# Patient Record
Sex: Female | Born: 1970 | Race: White | Hispanic: No | Marital: Married | Smoking: Former smoker
Health system: Southern US, Community
[De-identification: ages and names within clinical notes are randomized; demographics above are authoritative.]

## PROBLEM LIST (undated history)

## (undated) DIAGNOSIS — H269 Unspecified cataract: Secondary | ICD-10-CM

## (undated) DIAGNOSIS — I10 Essential (primary) hypertension: Secondary | ICD-10-CM

## (undated) DIAGNOSIS — E7800 Pure hypercholesterolemia, unspecified: Secondary | ICD-10-CM

## (undated) DIAGNOSIS — K5900 Constipation, unspecified: Secondary | ICD-10-CM

## (undated) DIAGNOSIS — F419 Anxiety disorder, unspecified: Secondary | ICD-10-CM

## (undated) DIAGNOSIS — R7303 Prediabetes: Secondary | ICD-10-CM

## (undated) DIAGNOSIS — E669 Obesity, unspecified: Secondary | ICD-10-CM

## (undated) DIAGNOSIS — K219 Gastro-esophageal reflux disease without esophagitis: Secondary | ICD-10-CM

## (undated) DIAGNOSIS — T7840XA Allergy, unspecified, initial encounter: Secondary | ICD-10-CM

## (undated) DIAGNOSIS — R310 Gross hematuria: Secondary | ICD-10-CM

## (undated) DIAGNOSIS — D649 Anemia, unspecified: Secondary | ICD-10-CM

## (undated) DIAGNOSIS — N3020 Other chronic cystitis without hematuria: Secondary | ICD-10-CM

## (undated) DIAGNOSIS — R011 Cardiac murmur, unspecified: Secondary | ICD-10-CM

## (undated) DIAGNOSIS — G4730 Sleep apnea, unspecified: Secondary | ICD-10-CM

## (undated) DIAGNOSIS — F32A Depression, unspecified: Secondary | ICD-10-CM

## (undated) HISTORY — PX: CARPAL TUNNEL RELEASE: SHX101

## (undated) HISTORY — PX: TOTAL ABDOMINAL HYSTERECTOMY: SHX209

## (undated) HISTORY — DX: Depression, unspecified: F32.A

## (undated) HISTORY — DX: Prediabetes: R73.03

## (undated) HISTORY — DX: Anxiety disorder, unspecified: F41.9

## (undated) HISTORY — PX: ADENOIDECTOMY: SUR15

## (undated) HISTORY — DX: Unspecified cataract: H26.9

## (undated) HISTORY — DX: Pure hypercholesterolemia, unspecified: E78.00

## (undated) HISTORY — DX: Gastro-esophageal reflux disease without esophagitis: K21.9

## (undated) HISTORY — DX: Allergy, unspecified, initial encounter: T78.40XA

## (undated) HISTORY — DX: Sleep apnea, unspecified: G47.30

## (undated) HISTORY — DX: Cardiac murmur, unspecified: R01.1

## (undated) HISTORY — DX: Other chronic cystitis without hematuria: N30.20

## (undated) HISTORY — DX: Constipation, unspecified: K59.00

## (undated) HISTORY — DX: Anemia, unspecified: D64.9

## (undated) HISTORY — DX: Obesity, unspecified: E66.9

## (undated) HISTORY — PX: TONSILLECTOMY: SUR1361

## (undated) HISTORY — PX: ULNAR TUNNEL RELEASE: SHX820

## (undated) HISTORY — DX: Essential (primary) hypertension: I10

## (undated) HISTORY — DX: Gross hematuria: R31.0

## (undated) HISTORY — PX: ABDOMINAL HYSTERECTOMY: SHX81

---

## 1997-10-14 ENCOUNTER — Inpatient Hospital Stay (HOSPITAL_COMMUNITY): Admission: AD | Admit: 1997-10-14 | Discharge: 1997-10-16 | Payer: Self-pay | Admitting: Obstetrics and Gynecology

## 1998-03-18 ENCOUNTER — Ambulatory Visit (HOSPITAL_COMMUNITY): Admission: RE | Admit: 1998-03-18 | Discharge: 1998-03-18 | Payer: Self-pay | Admitting: Gastroenterology

## 1998-05-15 ENCOUNTER — Other Ambulatory Visit: Admission: RE | Admit: 1998-05-15 | Discharge: 1998-05-15 | Payer: Self-pay | Admitting: Obstetrics & Gynecology

## 1998-05-31 ENCOUNTER — Emergency Department (HOSPITAL_COMMUNITY): Admission: EM | Admit: 1998-05-31 | Discharge: 1998-05-31 | Payer: Self-pay | Admitting: Family Medicine

## 1999-02-25 ENCOUNTER — Emergency Department (HOSPITAL_COMMUNITY): Admission: EM | Admit: 1999-02-25 | Discharge: 1999-02-25 | Payer: Self-pay | Admitting: Emergency Medicine

## 1999-06-05 ENCOUNTER — Other Ambulatory Visit: Admission: RE | Admit: 1999-06-05 | Discharge: 1999-06-05 | Payer: Self-pay | Admitting: Obstetrics & Gynecology

## 2000-04-23 ENCOUNTER — Emergency Department (HOSPITAL_COMMUNITY): Admission: EM | Admit: 2000-04-23 | Discharge: 2000-04-23 | Payer: Self-pay

## 2000-07-08 ENCOUNTER — Other Ambulatory Visit: Admission: RE | Admit: 2000-07-08 | Discharge: 2000-07-08 | Payer: Self-pay | Admitting: Obstetrics & Gynecology

## 2003-07-12 ENCOUNTER — Other Ambulatory Visit: Admission: RE | Admit: 2003-07-12 | Discharge: 2003-07-12 | Payer: Self-pay | Admitting: Obstetrics & Gynecology

## 2004-08-27 ENCOUNTER — Other Ambulatory Visit: Admission: RE | Admit: 2004-08-27 | Discharge: 2004-08-27 | Payer: Self-pay | Admitting: Obstetrics & Gynecology

## 2006-05-01 ENCOUNTER — Emergency Department (HOSPITAL_COMMUNITY): Admission: EM | Admit: 2006-05-01 | Discharge: 2006-05-01 | Payer: Self-pay | Admitting: Family Medicine

## 2007-11-21 ENCOUNTER — Emergency Department (HOSPITAL_COMMUNITY): Admission: EM | Admit: 2007-11-21 | Discharge: 2007-11-22 | Payer: Self-pay | Admitting: Emergency Medicine

## 2007-11-28 ENCOUNTER — Encounter: Admission: RE | Admit: 2007-11-28 | Discharge: 2007-11-28 | Payer: Self-pay | Admitting: Family Medicine

## 2008-07-02 ENCOUNTER — Inpatient Hospital Stay (HOSPITAL_COMMUNITY): Admission: RE | Admit: 2008-07-02 | Discharge: 2008-07-03 | Payer: Self-pay | Admitting: Obstetrics & Gynecology

## 2008-07-02 ENCOUNTER — Encounter (INDEPENDENT_AMBULATORY_CARE_PROVIDER_SITE_OTHER): Payer: Self-pay | Admitting: Obstetrics & Gynecology

## 2010-10-24 ENCOUNTER — Encounter: Payer: Self-pay | Admitting: Family Medicine

## 2010-10-24 DIAGNOSIS — F419 Anxiety disorder, unspecified: Secondary | ICD-10-CM

## 2010-10-24 DIAGNOSIS — I10 Essential (primary) hypertension: Secondary | ICD-10-CM

## 2010-12-09 NOTE — Op Note (Signed)
Jessica [name], Jessica [name]                   ACCOUNT NO.:  [id number]   MEDICAL RECORD NO.:  [id number]          PATIENT TYPE:  INP   LOCATION:  9318                          FACILITY:  [facility]   PHYSICIAN:  Gerrit Friends. Aldona Bar, M.D.   DATE OF BIRTH:  [birth year]   DATE OF PROCEDURE:  07/02/2008  DATE OF DISCHARGE:                               OPERATIVE REPORT   PREOPERATIVE DIAGNOSES:  1. Fibroid uterus.  2. Dysmenorrhea.  3. Menorrhagia.  4. Rule out endometriosis.   POSTOPERATIVE DIAGNOSES:  1. Fibroid uterus.  2. Dysmenorrhea.  3. Menorrhagia.  4. Rule out endometriosis.  5. 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  6. A left paratubal cyst (?).   PROCEDURES:  1. Laparoscopic-assisted vaginal hysterectomy.  2. Right salpingo-oophorectomy.  3. Removal of left paratubal cyst.   SURGEON:  Gerrit Friends. Aldona Bar, MD   ASSISTANT:  Randye Lobo, MD   ANESTHESIA:  General endotracheal.   DESCRIPTION OF PROCEDURE:  The patient was taken to the operating room,  where after satisfactory induction of general endotracheal anesthesia,  she was prepped and draped having placed in the short Allen stirrups in  a modified-lithotomy position.  She was prepped and draped.  Foley  catheter inserted as part of the prep.  As well, a tenaculum was placed  on the cervix to assist mobility during the laparoscopic portion of the  procedure.   Foley catheter was placed as mentioned.  Laparoscopy was begun at this  time.  A 1-cm subumbilical midline transverse skin incision was made,  and through this, the large trocar and sleeve was introduced without  difficulty.  Once the large trocar was removed, the laparoscope was  inserted through the large sleeve with good visualization of the intra-  abdominal structures.  At this time, a pneumoperitoneum was carried out  with approximately 3 liters of carbon dioxide gas.  The 5-mm trocar  and  sleeves were introduced through 5-mm incisions under direct  visualization without difficulty.   At this point, with the aide of Dr. Edward Jolly, it was possible to dissect  out and ascertain that there were adhesions posteriorly.  Uterus, which  was about twice normal size in posterior and on the left side, there was  a paratubal cyst adjacent to what appeared to be a totally normal left  ovary.  Decision was made to proceed with removal of the left paratubal  cyst, which was the aid of the gyrus, the cyst was freed up and  dissected out in its entirety.  A needle was used to aspirate  approximately 25 mL of serosanguineous fluid from the cyst, which was  sent to Cytology and thereafter, using the 5-mm laparoscope in the left  lower quadrant for visualization and replacing the large laparoscope  with a grasping instrument, the cyst structure was removed through the  large sleeve and likewise sent to Cytology fluid.  Thereafter, the scope  was replaced into the large sleeve and a 5-mm scope was removed from  left lower quadrant sleeve.  The left ovary was left alone at this time,  but did appear completely normal.  Attention was turned to the right  side.  The right ovary appeared to be totally stuck to the posterior  surface of the uterus obliterating part of the cul-de-sac, but with  meticulous dissection using the irrigator and aspirator as well, it was  possible to free up the right ovary, and during the freeing up process  what appeared to be an endometrioma was entered with some old chocolate  material emanating from the surface of the right ovary confirming, at  least in our mind, that we were dealing with endometriosis.  The  remnants of the right ovary were freed up, and after identification of  the right ureter, decision was made to remove the right tube and ovary,  and with minimal difficulty at this point using the gyrus, it was  possible to free up the right adnexa from the  pelvic sidewall again with  care taken to avoid the right ureter.  The infundibulopelvic pedicle on  the right as well as the right round ligament were freed up.  Dissection  was carried down to just above what was felt to be the area of the right  uterine artery.  Dissection on the right side at this point was stopped  and attention turned again to the left side.  Again, left ureter was  noted to be normal in its course and decision was made to leave the left  ovary as it appeared still normal.  Dissection of the left round  ligament was carried out again using the gyrus down to the level of the  left uterine artery.  At this point, all dissection appeared to be dry,  and good irrigation was carried out confirming this.  Decision was made  at this point to stop from above and go from below.  Accordingly,  everything above was secured and attention turned to the vaginal portion  of the procedure.  The patient was repositioned slightly - the stirrups  were elevated and vaginal hysterectomy portion of the procedure was  begun.  A speculum was placed in the vagina, and using a double-tooth  tenaculum on the cervix, the cervix pulled down fairly well and was  circumferentially injected with a dilute solution of Neo-Synephrine and  Marcaine - 0.5%.  Using a knife, the cervix was circumscribed.  Vagina  pushed off the cervix with ease.  It was possible to enter posteriorly.  Upon entering posteriorly, the cul-de-sac was roughened in its field  consistent with the previous dissection carried out from above.  At this  time using curved Heaney clamps and suture ligature of 0-Vicryl suture,  the uterosacral pedicles were clamped, cut, and secured as well as  additional parametrial pedicles bilaterally.  At this time, the anterior  peritoneum was entered with confirmation of appropriate entering and  additional pedicles were taken bilaterally securing the uterine artery  pedicles and just above and  essentially, the specimen was freed up and  removed.  It consisted of the cervix, uterus, right tube, and ovary.  At  this time, profuse inspection of the pedicles was carried out.  The  vaginal cuff posteriorly was then secured using 0-Vicryl in a running-  locking fashion from uterosacral-to-uterosacral.  Some cautery was  employed for oozing on the inner portion of the posterior cuff.  Urine  during this time remained clear through the Foley.  At this time,  it was  possible to secure what was felt to be the peritoneum in a purse-string  fashion.  The vaginal cuff was then closed in a vertical fashion using  interrupted 0-Vicryl suture with good results.  At this time, the  vaginal portion of the procedure was felt to be complete and the vaginal  cuff was felt to be relatively dry, and attention again was turned to  the laparoscopic visualization of the pelvis, which after appropriate  glove changes was carried out.  Again, the ureters were identified in  their course and appeared to be normal.  All pedicles appeared to be  dry.  The vaginal floor was irrigated and aspirated and no active  bleeding was noted.  At this time, the left ovary was visualized and  noted to be in a normal position and procedure was felt to be rather  complete and therefore terminated.  All instruments were removed.  All  sleeves were removed.  The pneumoperitoneum was reduced and closure of  the 3 incision sites were carried out with 4-0 Vicryl in an interrupted  fashion and Dermabond was placed as well over the incisions with  pressure dressings.   At this time, the patient was awakened and taken to the recovery room in  good condition, having tolerated the procedure fairly well.  All counts  were noted to be correct x2, and as mentioned previously, specimen  consisted of the uterus, cervix, right tube and ovary, and a separate  specimen of a left paratubal cyst with fluid was sent to Cytology as  well.    CONCLUSIONS:  The patient was felt postoperatively to have significant  pelvic endometriosis involving the right ovary, which was stuck to the  posterior surface of the large uterus, presumably enlarged by small  fibroids.  A preoperative diagnosis of menorrhagia and dysmenorrhea  certainly is explained by the intra-abdominal pathology that was noted.   Condition on arrival, recovery in satisfactory.      Gerrit Friends. Aldona Bar, M.D.  Electronically Signed     RMW/MEDQ  D:  07/02/2008  T:  07/03/2008  Job:  [id number]

## 2010-12-09 NOTE — H&P (Signed)
Jessica [name], Jessica [name]                   ACCOUNT NO.:  [id number]   MEDICAL RECORD NO.:  [id number]          PATIENT TYPE:  AMB   LOCATION:                                FACILITY:  [facility]   PHYSICIAN:  Gerrit Friends. Aldona Bar, M.D.   DATE OF BIRTH:  1971/06/29   DATE OF ADMISSION:  07/02/2008  DATE OF DISCHARGE:                              HISTORY & PHYSICAL   This patient is to be admitted for surgery on Monday, July 02, 2008,  at [facility].  Surgery scheduled for 11:30.  Please have the  history and physical in on the day of surgery for preop evaluation.   HISTORY:  This patient is a 40 year old, gravida 2, para 2, white female  who is being taken to the operating room for an LAVH, possible BSO with  a preoperative diagnosis of pelvic pain, possible endometriosis,  menorrhagia, small fibroids, and irregular cycles.  The patient has been  followed by me for a long period of time, and when seen in June 2009 she  related increasing problems with menorrhagia and irregular cycles as  well as brought up an issue about the possibility of endometriosis.  She  was noted on exam at that time to have a retroverted, slightly enlarged  uterus.  She subsequently had a sonohystogram in late September 2009  which revealed a small anterior fundal myoma, normal ovaries with a  normal endometrial thickness for a premenopausal female.  Because of her  continued discomfort and continued irregular cycles decision was made to  proceed with a laparoscopic-assisted vaginal hysterectomy with the  possibility of removing both tubes and ovaries if indeed intra-abdominal  findings revealed endometriosis.  Procedure was thoroughly discussed  with the patient on multiple occasions, and informed consent was given  numerous times, and she is now being taken to the operating room for  such procedure.   The patient has an allergy to PENICILLIN, although it was not felt to be  a reaction that would preclude giving Ancef  preoperatively IV as a  prophylactic antibiotic.   Medications include Ultram as needed for cramping.  Her only surgical  procedure was a repair of carpal tunnel 2-3 years ago.  She has had 2  vaginal deliveries, the last of which was 11 years ago.   FAMILY HISTORY, SOCIAL HISTORY, REVIEW OF SYSTEMS:  Negative -  noncontributory.   PHYSICAL EXAMINATION:  GENERAL:  Examination at time of admission finds  a well-developed white female in no distress.  VITAL SIGNS:  Blood pressure 116/85, weight 182, temperature 98.2,  respirations 18 and regular, pulse 80 and regular.  HEENT:  Negative.  Thyroid not enlarged.  CHEST:  Clear.  CARDIOVASCULAR:  Regular with no murmur.  ABDOMEN:  Negative.  PELVIC:  The uterus is about 8- to 10-week size, slightly irregular and  posterior.  It pulls down fairly well.  Adnexal area is unremarkable.  RECTOVAGINAL:  Confirmatory.  EXTREMITIES:  Negative.  NEUROLOGIC:  Physiologic.   IMPRESSION:  Pelvic pain and possible endometriosis, menorrhagia, and  irregular cycles associated with a small  fibroid uterus.   The patient will undergo a laparoscopic-assisted vaginal hysterectomy,  and if intraoperative findings warrant have both tubes and ovaries  removed as well.      Gerrit Friends. Aldona Bar, M.D.  Electronically Signed     RMW/MEDQ  D:  06/29/2008  T:  06/29/2008  Job:  [id number]   cc:   [facility]

## 2010-12-09 NOTE — Discharge Summary (Signed)
Jessica [name], Jessica [name]                   ACCOUNT NO.:  [id number]   MEDICAL RECORD NO.:  [id number]          PATIENT TYPE:  INP   LOCATION:  9318                          FACILITY:  [facility]   PHYSICIAN:  Gerrit Friends. Aldona Bar, M.D.   DATE OF BIRTH:  1971/06/20   DATE OF ADMISSION:  07/02/2008  DATE OF DISCHARGE:  07/03/2008                               DISCHARGE SUMMARY   DISCHARGE DIAGNOSIS:  Pelvic endometriosis.   PROCEDURES:  Laparoscopic-assisted vaginal hysterectomy, right salpingo-  oophorectomy, resection of left paratubal cyst.   SUMMARY:  This patient with suspected endometriosis was taken to the  operating room on July 02, 2008, for an LAVH and was discovered  intraoperatively to have what was suspected, which was significant  pelvic endometriosis.  She had an endometrioma in her right ovary which  was stuck to the back of the slightly enlarged fibroid uterus.  She  underwent an LAVH, RSO, and resection of a left paratubal cyst.  Pathologic specimen revealed endometriosis as expected in the right  ovary and the left paratubal cyst was totally benign.  Uterus was also  slightly enlarged because of a benign fibroid.   The patient had an excellent postoperative course.  On the morning of  July 03, 2008, her hemoglobin was 10.8 with a white count of 12,000,  and platelet count of 242,000.  She remained afebrile during her  hospital course.  She experienced a good day on July 03, 2008, with  normal bladder function and although at the time of discharge, was not  passing gas or had had a BM.  Her abdomen was soft, nontender, and bowel  sounds well heard.  Three abdominal puncture wounds were healing well  and she was having minimal to no vaginal spotting.   Indeed, the patient admits to feeling better now that her surgeries over  than she did prior to the surgery being carried out secondary to the  pain that she was having from her suspected endometriosis.   The patient was  discharged to home with all appropriate instructions.  She will be followed up in the office in approximately four weeks' time.  She was given prescriptions for Tylox to use 1-2 every 4-6 hours as  needed for severe pain and Motrin 600 mg to use every 6 hours for  cramping or mild pain.  As mentioned, she was given all appropriate  instructions and understood all instructions well.  She will be followed  up as mentioned in four weeks' time.   Pathologic report had returned at the time of discharge and was reviewed  with the patient.   CONDITION ON DISCHARGE:  Improved.      Gerrit Friends. Aldona Bar, M.D.  Electronically Signed     RMW/MEDQ  D:  07/03/2008  T:  07/04/2008  Job:  [id number]

## 2010-12-28 ENCOUNTER — Emergency Department (HOSPITAL_COMMUNITY)
Admission: EM | Admit: 2010-12-28 | Discharge: 2010-12-28 | Disposition: A | Discharge status: Home / Self Care | Payer: 59 | Attending: Emergency Medicine | Admitting: Emergency Medicine

## 2010-12-28 ENCOUNTER — Emergency Department (HOSPITAL_COMMUNITY): Payer: 59

## 2010-12-28 DIAGNOSIS — I10 Essential (primary) hypertension: Secondary | ICD-10-CM | POA: Insufficient documentation

## 2010-12-28 DIAGNOSIS — Z79899 Other long term (current) drug therapy: Secondary | ICD-10-CM | POA: Insufficient documentation

## 2010-12-28 DIAGNOSIS — F329 Major depressive disorder, single episode, unspecified: Secondary | ICD-10-CM | POA: Insufficient documentation

## 2010-12-28 DIAGNOSIS — Y92009 Unspecified place in unspecified non-institutional (private) residence as the place of occurrence of the external cause: Secondary | ICD-10-CM | POA: Insufficient documentation

## 2010-12-28 DIAGNOSIS — M79609 Pain in unspecified limb: Secondary | ICD-10-CM | POA: Insufficient documentation

## 2010-12-28 DIAGNOSIS — X500XXA Overexertion from strenuous movement or load, initial encounter: Secondary | ICD-10-CM | POA: Insufficient documentation

## 2010-12-28 DIAGNOSIS — F3289 Other specified depressive episodes: Secondary | ICD-10-CM | POA: Insufficient documentation

## 2010-12-28 DIAGNOSIS — S8990XA Unspecified injury of unspecified lower leg, initial encounter: Secondary | ICD-10-CM | POA: Insufficient documentation

## 2010-12-28 DIAGNOSIS — S92309A Fracture of unspecified metatarsal bone(s), unspecified foot, initial encounter for closed fracture: Secondary | ICD-10-CM | POA: Insufficient documentation

## 2011-04-20 ENCOUNTER — Other Ambulatory Visit: Payer: Self-pay | Admitting: Dermatology

## 2011-04-21 LAB — COMPREHENSIVE METABOLIC PANEL
ALT: 30
BUN: 7
CO2: 26
Calcium: 8.8
Creatinine, Ser: 0.55
GFR calc non Af Amer: >60
Glucose, Bld: 96
Sodium: 139
Total Protein: 6.6

## 2011-04-21 LAB — URINALYSIS, ROUTINE W REFLEX MICROSCOPIC
Glucose, UA: NEGATIVE
pH: 6.5

## 2011-04-21 LAB — DIFFERENTIAL
Lymphocytes Relative: 31
Lymphs Abs: 2.8
Neutrophils Relative %: 57

## 2011-04-21 LAB — CBC
HCT: 32.3 — ABNORMAL LOW
Hemoglobin: 11.2 — ABNORMAL LOW
MCHC: 34.7
MCV: 88
RBC: 3.68 — ABNORMAL LOW
RDW: 13.6

## 2011-04-21 LAB — URINE MICROSCOPIC-ADD ON

## 2011-04-21 LAB — POCT CARDIAC MARKERS: Myoglobin, poc: 20.3

## 2011-05-01 LAB — COMPREHENSIVE METABOLIC PANEL
AST: 18 U/L (ref 0–37)
Alkaline Phosphatase: 82 U/L (ref 39–117)
CO2: 26 mEq/L (ref 19–32)
Chloride: 105 mEq/L (ref 96–112)
Creatinine, Ser: 0.59 mg/dL (ref 0.4–1.2)
GFR calc Af Amer: >60 mL/min (ref >60)
GFR calc non Af Amer: >60 mL/min (ref >60)
Potassium: 3.6 mEq/L (ref 3.5–5.1)
Total Bilirubin: 0.5 mg/dL (ref 0.3–1.2)

## 2011-05-01 LAB — CBC
HCT: 40 % (ref 36.0–46.0)
MCHC: 34.5 g/dL (ref 30.0–36.0)
MCV: 90.3 fL (ref 78.0–100.0)
Platelets: 242 10*3/uL (ref 150–400)
RBC: 3.46 MIL/uL — ABNORMAL LOW (ref 3.87–5.11)
RBC: 4.44 MIL/uL (ref 3.87–5.11)
RDW: 13.5 % (ref 11.5–15.5)
WBC: 9.2 10*3/uL (ref 4.0–10.5)

## 2011-06-05 ENCOUNTER — Other Ambulatory Visit: Payer: Self-pay | Admitting: Dermatology

## 2012-05-17 ENCOUNTER — Other Ambulatory Visit: Payer: Self-pay | Admitting: Family Medicine

## 2012-05-17 DIAGNOSIS — N6452 Nipple discharge: Secondary | ICD-10-CM

## 2012-05-27 ENCOUNTER — Ambulatory Visit
Admission: RE | Admit: 2012-05-27 | Discharge: 2012-05-27 | Disposition: A | Discharge status: Home / Self Care | Payer: 59 | Source: Ambulatory Visit | Attending: Family Medicine | Admitting: Family Medicine

## 2012-05-27 DIAGNOSIS — N6452 Nipple discharge: Secondary | ICD-10-CM

## 2016-02-20 DIAGNOSIS — M26629 Arthralgia of temporomandibular joint, unspecified side: Secondary | ICD-10-CM | POA: Insufficient documentation

## 2016-03-26 ENCOUNTER — Encounter (HOSPITAL_COMMUNITY): Payer: Self-pay | Admitting: Emergency Medicine

## 2016-03-26 ENCOUNTER — Emergency Department (HOSPITAL_COMMUNITY)
Admission: EM | Admit: 2016-03-26 | Discharge: 2016-03-26 | Disposition: A | Discharge status: Home / Self Care | Payer: 59 | Attending: Emergency Medicine | Admitting: Emergency Medicine

## 2016-03-26 DIAGNOSIS — I10 Essential (primary) hypertension: Secondary | ICD-10-CM | POA: Diagnosis not present

## 2016-03-26 DIAGNOSIS — N390 Urinary tract infection, site not specified: Secondary | ICD-10-CM | POA: Diagnosis present

## 2016-03-26 DIAGNOSIS — Z79899 Other long term (current) drug therapy: Secondary | ICD-10-CM | POA: Diagnosis not present

## 2016-03-26 DIAGNOSIS — N12 Tubulo-interstitial nephritis, not specified as acute or chronic: Secondary | ICD-10-CM | POA: Insufficient documentation

## 2016-03-26 LAB — COMPREHENSIVE METABOLIC PANEL
ALT: 32 U/L (ref 14–54)
AST: 32 U/L (ref 15–41)
Albumin: 3.9 g/dL (ref 3.5–5.0)
Alkaline Phosphatase: 88 U/L (ref 38–126)
Anion gap: 7 (ref 5–15)
BILIRUBIN TOTAL: 0.2 mg/dL — AB (ref 0.3–1.2)
BUN: 6 mg/dL (ref 6–20)
CALCIUM: 9 mg/dL (ref 8.9–10.3)
CO2: 22 mmol/L (ref 22–32)
CREATININE: 0.66 mg/dL (ref 0.44–1.00)
Chloride: 107 mmol/L (ref 101–111)
GFR calc Af Amer: >60 mL/min (ref >60)
Glucose, Bld: 147 mg/dL — ABNORMAL HIGH (ref 65–99)
Potassium: 3.6 mmol/L (ref 3.5–5.1)
Sodium: 136 mmol/L (ref 135–145)
TOTAL PROTEIN: 6.7 g/dL (ref 6.5–8.1)

## 2016-03-26 LAB — URINALYSIS, ROUTINE W REFLEX MICROSCOPIC
BILIRUBIN URINE: NEGATIVE
Glucose, UA: NEGATIVE mg/dL
KETONES UR: NEGATIVE mg/dL
Nitrite: POSITIVE — AB
Protein, ur: NEGATIVE mg/dL
SPECIFIC GRAVITY, URINE: 1.007 (ref 1.005–1.030)
pH: 6.5 (ref 5.0–8.0)

## 2016-03-26 LAB — CBC
HCT: 39.9 % (ref 36.0–46.0)
Hemoglobin: 12.8 g/dL (ref 12.0–15.0)
MCH: 30.3 pg (ref 26.0–34.0)
MCHC: 32.1 g/dL (ref 30.0–36.0)
MCV: 94.5 fL (ref 78.0–100.0)
PLATELETS: 333 10*3/uL (ref 150–400)
RBC: 4.22 MIL/uL (ref 3.87–5.11)
RDW: 13.3 % (ref 11.5–15.5)
WBC: 13.5 10*3/uL — AB (ref 4.0–10.5)

## 2016-03-26 LAB — URINE MICROSCOPIC-ADD ON: WBC, UA: NONE SEEN WBC/hpf (ref 0–5)

## 2016-03-26 MED ORDER — DEXTROSE 5 % IV SOLN
1.0000 g | Freq: Once | INTRAVENOUS | Status: AC
  Administered 2016-03-26: 1 g via INTRAVENOUS
  Filled 2016-03-26: qty 10

## 2016-03-26 MED ORDER — CEPHALEXIN 500 MG PO CAPS: 500.0000 mg | ORAL_CAPSULE | Freq: Four times a day (QID) | ORAL | 0 refills | Status: DC

## 2016-03-26 MED ORDER — MORPHINE SULFATE (PF) 4 MG/ML IV SOLN
4.0000 mg | Freq: Once | INTRAVENOUS | Status: AC
  Administered 2016-03-26: 4 mg via INTRAVENOUS
  Filled 2016-03-26: qty 1

## 2016-03-26 MED ORDER — SODIUM CHLORIDE 0.9 % IV BOLUS (SEPSIS)
1000.0000 mL | Freq: Once | INTRAVENOUS | Status: AC
  Administered 2016-03-26: 1000 mL via INTRAVENOUS

## 2016-03-26 MED ORDER — HYDROCODONE-ACETAMINOPHEN 5-325 MG PO TABS: 1.0000 | ORAL_TABLET | Freq: Four times a day (QID) | ORAL | 0 refills | Status: DC | PRN

## 2016-03-26 MED ORDER — ONDANSETRON HCL 4 MG PO TABS: 4.0000 mg | ORAL_TABLET | Freq: Three times a day (TID) | ORAL | 0 refills | Status: DC | PRN

## 2016-03-26 MED ORDER — ONDANSETRON 4 MG PO TBDP
4.0000 mg | ORAL_TABLET | Freq: Once | ORAL | Status: AC | PRN
  Administered 2016-03-26: 4 mg via ORAL

## 2016-03-26 MED ORDER — OXYCODONE-ACETAMINOPHEN 5-325 MG PO TABS
ORAL_TABLET | ORAL | Status: AC
  Filled 2016-03-26: qty 1

## 2016-03-26 MED ORDER — ONDANSETRON 4 MG PO TBDP
ORAL_TABLET | ORAL | Status: AC
  Filled 2016-03-26: qty 1

## 2016-03-26 MED ORDER — ONDANSETRON HCL 4 MG/2ML IJ SOLN
4.0000 mg | Freq: Once | INTRAMUSCULAR | Status: AC
  Administered 2016-03-26: 4 mg via INTRAVENOUS
  Filled 2016-03-26: qty 2

## 2016-03-26 MED ORDER — OXYCODONE-ACETAMINOPHEN 5-325 MG PO TABS
1.0000 | ORAL_TABLET | ORAL | Status: AC | PRN
Start: 2016-03-26 — End: 2016-03-26
  Administered 2016-03-26 (×2): 1 via ORAL

## 2016-03-26 NOTE — ED Notes (Signed)
Pt states she can take zofran

## 2016-03-26 NOTE — ED Notes (Signed)
Santiago Glad - RN aware of pt's BP.

## 2016-03-26 NOTE — ED Triage Notes (Signed)
Pt states "I had a UTI two weeks ago, I went to the doctor and they gave me cipro, they did a dipstick. Gave me cipro 500mg , it seemed like it was getting better, but now ti feels like its gotten worse. I've started taking azo. I went back to the doctor to have another urine sample done and now i've got blood in my urine, I asked them to culture it but I feel nauseated, my stomach hurts, my back hurts".

## 2016-03-26 NOTE — ED Provider Notes (Signed)
[facility] DEPT Provider Note   CSN: [id number] Arrival date & time: 03/26/16  1657     History   Chief Complaint Chief Complaint  Patient presents with  . Flank Pain  . Urinary Tract Infection    HPI Jessica [name] is a 45 y.o. female.  HPI   Patient is a 45 year old obese female with PMHx of anxiety, HTN and chronic cystitis, she presents to the ER with 2 weeks of worsening suprapubic abdominal pain and left flank pain with dysuria and urinary frequency. She was seen by her PCP 2 weeks ago and took a course of Cipro for 5 days but did not feel much improvement. She has been taking Azo and aleve daily without any improvement and feels that she has rapidly worsened over the past 2-3 days. Her abdominal pain and flank pain is constant and achy described, 10/10 pain worse with movement and with urination.  She has no severe nausea but denies fever, chills, sweats, vomiting. She went to her doctor yesterday requesting reevaluation and a urine culture was sent. She declined to have any more antibiotics because of diarrhea.  She presents to the ER today because of severely worsening pain.  She is status post hysterectomy, monogamous with her husband.  She does not have any vaginal complaints including no burning, itching or discharge.  No other acute or systemic complaints. She believes she has a kidney infection  Past Medical History:  Diagnosis Date  . Anxiety   . Cystitis, chronic   . Gross hematuria   . Hypertension   . Obesity     Patient Active Problem List   Diagnosis Date Noted  . HTN (hypertension) 10/24/2010  . Anxiety 10/24/2010    Past Surgical History:  Procedure Laterality Date  . ABDOMINAL HYSTERECTOMY    . ADENOIDECTOMY    . CARPAL TUNNEL RELEASE    . TONSILLECTOMY      OB History    No data available       Home Medications    Prior to Admission medications   Medication Sig Start Date End Date Taking? Authorizing Provider  AZO-CRANBERRY PO Take 1  tablet by mouth daily as needed (for UTI).   Yes Historical Provider, MD  lisinopril (PRINIVIL,ZESTRIL) 20 MG tablet Take 20 mg by mouth at bedtime.   Yes Historical Provider, MD  metoprolol succinate (TOPROL-XL) 50 MG 24 hr tablet Take 50 mg by mouth at bedtime.   Yes Historical Provider, MD  naproxen sodium (ANAPROX) 220 MG tablet Take 220 mg by mouth 2 (two) times daily as needed (for pain).   Yes Historical Provider, MD  cephALEXin (KEFLEX) 500 MG capsule Take 1 capsule (500 mg total) by mouth 4 (four) times daily. 03/26/16   Delsa Grana, PA-C  HYDROcodone-acetaminophen (NORCO/VICODIN) 5-325 MG tablet Take 1-2 tablets by mouth every 6 (six) hours as needed for severe pain. 03/26/16   Delsa Grana, PA-C  ondansetron (ZOFRAN) 4 MG tablet Take 1 tablet (4 mg total) by mouth every 8 (eight) hours as needed for nausea or vomiting. 03/26/16   Delsa Grana, PA-C    Family History Family History  Problem Relation Age of Onset  . Hyperlipidemia Mother   . Diabetes Father   . Hypertension Father   . Stroke Father 56    Social History Social History  Substance Use Topics  . Smoking status: Never Smoker  . Smokeless tobacco: Not on file  . Alcohol use No     Allergies   Citalopram  hydrobromide; Penicillins; and Vioxx [rofecoxib]   Review of Systems Review of Systems  All other systems reviewed and are negative.    Physical Exam Updated Vital Signs BP 130/88   Pulse 89   Temp 98.3 F (36.8 C) (Oral)   Resp 18   SpO2 98%   Physical Exam  Constitutional: She is oriented to person, place, and time. She appears well-developed and well-nourished. No distress.  Obese female, appears stated age, nontoxic in appearance, appears mildly uncomfortable, laying in the ER gurney on her right side crawled into the fetal position, NAD.  HENT:  Head: Normocephalic and atraumatic.  Nose: Nose normal.  Mouth/Throat: Oropharynx is clear and moist.  Eyes: Conjunctivae and EOM are normal. Pupils are  equal, round, and reactive to light. Right eye exhibits no discharge. Left eye exhibits no discharge. No scleral icterus.  Neck: Normal range of motion.  Cardiovascular: Normal rate, regular rhythm, normal heart sounds and intact distal pulses.  Exam reveals no gallop and no friction rub.   No murmur heard. Pulmonary/Chest: Effort normal and breath sounds normal. No stridor. No respiratory distress. She has no decreased breath sounds. She has no wheezes. She has no rhonchi. She has no rales. She exhibits no tenderness.  Abdominal: Soft. Normal appearance and bowel sounds are normal. She exhibits no distension and no mass. There is tenderness in the right lower quadrant, suprapubic area and left lower quadrant. There is guarding and CVA tenderness. There is no rigidity, no rebound, no tenderness at McBurney's point and negative Murphy's sign.    Left sided CVA tenderness  Musculoskeletal: Normal range of motion.  Neurological: She is alert and oriented to person, place, and time.  Skin: Skin is warm and dry. Capillary refill takes less than 2 seconds. No rash noted. She is not diaphoretic. No erythema.  Psychiatric: She has a normal mood and affect. Her behavior is normal. Judgment and thought content normal.  Nursing note and vitals reviewed.    ED Treatments / Results  Labs (all labs ordered are listed, but only abnormal results are displayed) Labs Reviewed  URINALYSIS, ROUTINE W REFLEX MICROSCOPIC (NOT AT [facility]) - Abnormal; Notable for the following:       Result Value   Color, Urine ORANGE (*)    APPearance CLOUDY (*)    Hgb urine dipstick TRACE (*)    Nitrite POSITIVE (*)    Leukocytes, UA TRACE (*)    All other components within normal limits  CBC - Abnormal; Notable for the following:    WBC 13.5 (*)    All other components within normal limits  COMPREHENSIVE METABOLIC PANEL - Abnormal; Notable for the following:    Glucose, Bld 147 (*)    Total Bilirubin 0.2 (*)    All other  components within normal limits  URINE MICROSCOPIC-ADD ON - Abnormal; Notable for the following:    Squamous Epithelial / LPF 0-5 (*)    Bacteria, UA FEW (*)    All other components within normal limits  URINE CULTURE    EKG  EKG Interpretation None       Radiology No results found.  Procedures Procedures (including critical care time)  Medications Ordered in ED Medications  oxyCODONE-acetaminophen (PERCOCET/ROXICET) 5-325 MG per tablet 1 tablet (1 tablet Oral Given 03/26/16 1719)  ondansetron (ZOFRAN-ODT) disintegrating tablet 4 mg (4 mg Oral Given 03/26/16 1718)  sodium chloride 0.9 % bolus 1,000 mL (1,000 mLs Intravenous New Bag/Given 03/26/16 2100)  ondansetron (ZOFRAN) injection 4 mg (4  mg Intravenous Given 03/26/16 2059)  morphine 4 MG/ML injection 4 mg (4 mg Intravenous Given 03/26/16 2100)  cefTRIAXone (ROCEPHIN) 1 g in dextrose 5 % 50 mL IVPB (1 g Intravenous New Bag/Given 03/26/16 2101)     Initial Impression / Assessment and Plan / ED Course  I have reviewed the triage vital signs and the nursing notes.  Pertinent labs & imaging results that were available during my care of the patient were reviewed by me and considered in my medical decision making (see chart for details).  Clinical Course  Patient is a 45 year old female with UTI symptoms including urinary frequency, urgency with dysuria, and new onset left flank pain with nausea. Was diagnosed with UTI 2 weeks ago did a day course of Cipro but did not improve and has gradually worsened over the past several days. Presented to the ER tonight for worsening pain and nausea. She is afebrile with stable vital signs normal limits, no vomiting, nontoxic appearing. She has suprapubic abdominal tenderness with voluntary guarding and left CVA tenderness, consistent with pyelonephritis.  Low risk for STDs, denies vaginal complaints. Urinalysis is consistent with UTI, leukocytosis of 13.5. Urine culture sent.   Treated in the ER  with 1 g of IV Rocephin, fluids, antinausea and pain medication.  She is tolerating PO's, safe to d/c home with oral Abx tx.    Final Clinical Impressions(s) / ED Diagnoses   Final diagnoses:  Pyelonephritis    New Prescriptions New Prescriptions   CEPHALEXIN (KEFLEX) 500 MG CAPSULE    Take 1 capsule (500 mg total) by mouth 4 (four) times daily.   HYDROCODONE-ACETAMINOPHEN (NORCO/VICODIN) 5-325 MG TABLET    Take 1-2 tablets by mouth every 6 (six) hours as needed for severe pain.   ONDANSETRON (ZOFRAN) 4 MG TABLET    Take 1 tablet (4 mg total) by mouth every 8 (eight) hours as needed for nausea or vomiting.     Delsa Grana, PA-C 03/27/16 [id number]    Ripley Fraise, MD 03/31/16 [phone]

## 2016-03-27 LAB — URINE CULTURE

## 2016-04-30 ENCOUNTER — Encounter (HOSPITAL_COMMUNITY): Payer: Self-pay

## 2016-04-30 ENCOUNTER — Emergency Department (HOSPITAL_COMMUNITY)
Admission: EM | Admit: 2016-04-30 | Discharge: 2016-05-01 | Disposition: A | Discharge status: Home / Self Care | Payer: 59 | Attending: Emergency Medicine | Admitting: Emergency Medicine

## 2016-04-30 DIAGNOSIS — I10 Essential (primary) hypertension: Secondary | ICD-10-CM | POA: Diagnosis not present

## 2016-04-30 DIAGNOSIS — M79605 Pain in left leg: Secondary | ICD-10-CM | POA: Diagnosis not present

## 2016-04-30 DIAGNOSIS — R202 Paresthesia of skin: Secondary | ICD-10-CM | POA: Diagnosis not present

## 2016-04-30 DIAGNOSIS — Z79899 Other long term (current) drug therapy: Secondary | ICD-10-CM | POA: Insufficient documentation

## 2016-04-30 DIAGNOSIS — M79662 Pain in left lower leg: Secondary | ICD-10-CM

## 2016-04-30 LAB — D-DIMER, QUANTITATIVE (NOT AT ARMC): D DIMER QUANT: 0.33 ug{FEU}/mL (ref 0.00–0.50)

## 2016-04-30 MED ORDER — KETOROLAC TROMETHAMINE 30 MG/ML IJ SOLN
30.0000 mg | Freq: Once | INTRAMUSCULAR | Status: AC
  Administered 2016-04-30: 30 mg via INTRAMUSCULAR
  Filled 2016-04-30: qty 1

## 2016-04-30 NOTE — ED Triage Notes (Signed)
Pt states that she started having pain in her L calf yesterday. No redness or swelling to calf. Denies SOB. Pt also c/o back pain.

## 2016-04-30 NOTE — ED Notes (Signed)
Patient ambulated independently to the room.  No signs of distress noted

## 2016-04-30 NOTE — ED Provider Notes (Signed)
Jessica [name] Provider Note   CSN: [id number] Arrival date & time: 04/30/16  1921     History   Chief Complaint Chief Complaint  Patient presents with  . Leg Pain    HPI Jessica [name] is a 45 y.o. female.  HPI Patient presents with concern of left calf pain.  Onset was atraumatic, without clear precipitant, yesterday. Since that time the pain has been persistent, moderate, sore, throat the posterior left calf. Symptoms are worse with ambulation. No medication taken for relief. No loss of sensation distally, though there is paresthesias in the foot. No new dyspnea, chest pain, lightheadedness, syncope. Patient smokes, has no history of clotting disorder.   Smoking cessation provided, particularly in light of this patient's evaluation in the ED.  Past Medical History:  Diagnosis Date  . Anxiety   . Cystitis, chronic   . Gross hematuria   . Hypertension   . Obesity     Patient Active Problem List   Diagnosis Date Noted  . HTN (hypertension) 10/24/2010  . Anxiety 10/24/2010    Past Surgical History:  Procedure Laterality Date  . ABDOMINAL HYSTERECTOMY    . ADENOIDECTOMY    . CARPAL TUNNEL RELEASE    . TONSILLECTOMY      OB History    No data available       Home Medications    Prior to Admission medications   Medication Sig Start Date End Date Taking? Authorizing Provider  AZO-CRANBERRY PO Take 1 tablet by mouth daily as needed (for UTI).    Historical Provider, MD  cephALEXin (KEFLEX) 500 MG capsule Take 1 capsule (500 mg total) by mouth 4 (four) times daily. 03/26/16   Delsa Grana, PA-C  HYDROcodone-acetaminophen (NORCO/VICODIN) 5-325 MG tablet Take 1-2 tablets by mouth every 6 (six) hours as needed for severe pain. 03/26/16   Delsa Grana, PA-C  lisinopril (PRINIVIL,ZESTRIL) 20 MG tablet Take 20 mg by mouth at bedtime.    Historical Provider, MD  metoprolol succinate (TOPROL-XL) 50 MG 24 hr tablet Take 50 mg by mouth at bedtime.    Historical  Provider, MD  naproxen sodium (ANAPROX) 220 MG tablet Take 220 mg by mouth 2 (two) times daily as needed (for pain).    Historical Provider, MD  ondansetron (ZOFRAN) 4 MG tablet Take 1 tablet (4 mg total) by mouth every 8 (eight) hours as needed for nausea or vomiting. 03/26/16   Delsa Grana, PA-C    Family History Family History  Problem Relation Age of Onset  . Hyperlipidemia Mother   . Diabetes Father   . Hypertension Father   . Stroke Father 27    Social History Social History  Substance Use Topics  . Smoking status: Never Smoker  . Smokeless tobacco: Never Used  . Alcohol use No     Allergies   Citalopram hydrobromide; Penicillins; and Vioxx [rofecoxib]   Review of Systems Review of Systems  Constitutional:       Per HPI, otherwise negative  HENT:       Per HPI, otherwise negative  Respiratory:       Per HPI, otherwise negative  Cardiovascular:       Per HPI, otherwise negative  Gastrointestinal: Negative for vomiting.  Endocrine:       Negative aside from HPI  Genitourinary:       Neg aside from HPI   Musculoskeletal:       Per HPI, otherwise negative  Skin: Negative for color change.  Neurological: Negative for  syncope.     Physical Exam Updated Vital Signs BP (!) 147/103 (BP Location: Right Arm)   Pulse 112   Temp 98.7 F (37.1 C) (Oral)   Resp 16   Ht 5\' 1"  (1.549 m)   Wt 215 lb (97.5 kg)   SpO2 97%   BMI 40.62 kg/m   Physical Exam  Constitutional: She is oriented to person, place, and time. She appears well-developed and well-nourished. No distress.  HENT:  Head: Normocephalic and atraumatic.  Eyes: Conjunctivae and EOM are normal.  Cardiovascular: Normal rate and regular rhythm.   Pulmonary/Chest: Effort normal and breath sounds normal. No stridor. No respiratory distress. She has no wheezes. She has no rales.  Abdominal: She exhibits no distension.  Musculoskeletal: She exhibits no edema.  Left calf greater circumference compared to  right, with tenderness to palpation about the posterior, no skin color changes. Achilles, and other tendon function intact, knee, ankle.   Neurological: She is alert and oriented to person, place, and time. No cranial nerve deficit.  Skin: Skin is warm and dry.  Psychiatric: She has a normal mood and affect.  Nursing note and vitals reviewed.    ED Treatments / Results  Labs (all labs ordered are listed, but only abnormal results are displayed) Labs Reviewed  D-DIMER, QUANTITATIVE (NOT AT [facility])     Procedures Procedures (including critical care time)  Medications Ordered in ED Medications  ketorolac (TORADOL) 30 MG/ML injection 30 mg (not administered)     Initial Impression / Assessment and Plan / ED Course  I have reviewed the triage vital signs and the nursing notes.  Pertinent labs & imaging results that were available during my care of the patient were reviewed by me and considered in my medical decision making (see chart for details).  Clinical Course    Reason in no distress aware of all findings. With normal d-dimer, no indication for additional imaging.   Final Clinical Impressions(s) / ED Diagnoses  She presents with new left calf pain. Here she is awake and alert, distally neurovascularly intact, with a tender calf, but no distention, no evidence for compartment syndrome, normal d-dimer suggests low probability of DVT, given her risk profile. Patient discharged with anti-inflammatories, ice, primary care follow-up.   Carmin Muskrat, MD 04/30/16 [phone]

## 2016-04-30 NOTE — Discharge Instructions (Signed)
As discussed, your evaluation today has been largely reassuring.  But, it is important that you monitor your condition carefully, and do not hesitate to return to the ED if you develop new, or concerning changes in your condition. ? ?Otherwise, please follow-up with your physician for appropriate ongoing care. ? ?

## 2016-05-01 NOTE — ED Notes (Signed)
Pt left without having discharge vitals updated.

## 2016-05-01 NOTE — ED Notes (Signed)
Pt requesting new discharge diagnosis, EDP aware.

## 2017-09-04 DIAGNOSIS — H52223 Regular astigmatism, bilateral: Secondary | ICD-10-CM | POA: Diagnosis not present

## 2017-09-24 ENCOUNTER — Encounter: Payer: Self-pay | Admitting: Obstetrics & Gynecology

## 2017-09-24 ENCOUNTER — Ambulatory Visit (INDEPENDENT_AMBULATORY_CARE_PROVIDER_SITE_OTHER): Payer: 59 | Admitting: Obstetrics & Gynecology

## 2017-09-24 VITALS — BP 122/82 | HR 91 | Ht 62.0 in | Wt 208.5 lb

## 2017-09-24 DIAGNOSIS — N390 Urinary tract infection, site not specified: Secondary | ICD-10-CM | POA: Diagnosis not present

## 2017-09-24 DIAGNOSIS — N952 Postmenopausal atrophic vaginitis: Secondary | ICD-10-CM

## 2017-09-24 DIAGNOSIS — Z01419 Encounter for gynecological examination (general) (routine) without abnormal findings: Secondary | ICD-10-CM

## 2017-09-24 DIAGNOSIS — Z01411 Encounter for gynecological examination (general) (routine) with abnormal findings: Secondary | ICD-10-CM

## 2017-09-24 MED ORDER — CITALOPRAM HYDROBROMIDE 20 MG PO TABS: 20.0000 mg | ORAL_TABLET | Freq: Every day | ORAL | 11 refills | Status: DC

## 2017-09-24 MED ORDER — ATORVASTATIN CALCIUM 10 MG PO TABS: 10.0000 mg | ORAL_TABLET | Freq: Every day | ORAL | 2 refills | Status: DC

## 2017-09-24 MED ORDER — LISINOPRIL 20 MG PO TABS: 20.0000 mg | ORAL_TABLET | Freq: Every day | ORAL | 2 refills | Status: DC

## 2017-09-24 MED ORDER — NITROFURANTOIN MONOHYD MACRO 100 MG PO CAPS: ORAL_CAPSULE | ORAL | 3 refills | Status: DC

## 2017-09-24 MED ORDER — ESTROGENS, CONJUGATED 0.625 MG/GM VA CREA: TOPICAL_CREAM | VAGINAL | 12 refills | Status: DC

## 2017-09-24 MED ORDER — METFORMIN HCL 500 MG PO TABS: 500.0000 mg | ORAL_TABLET | Freq: Two times a day (BID) | ORAL | 2 refills | Status: DC

## 2017-09-24 NOTE — Progress Notes (Signed)
Subjective:     Jessica [name] is a 47 y.o. female here for a routine exam.  No LMP recorded. Patient has had a hysterectomy. [id number] Birth Control Method:  Hysterectomy due to endometriosis, op report from 2009 is reviewed Menstrual Calendar(currently): amenorrheic  Current complaints: recurrent UTI.   Current acute medical issues:  hypertension   Recent Gynecologic History No LMP recorded. Patient has had a hysterectomy. Last Pap: unsure(does not need them),   Last mammogram: 2013,  normal  Past Medical History:  Diagnosis Date  . Anxiety   . Cystitis, chronic   . Gross hematuria   . High cholesterol   . Hypertension   . Obesity     Past Surgical History:  Procedure Laterality Date  . ABDOMINAL HYSTERECTOMY    . ADENOIDECTOMY    . CARPAL TUNNEL RELEASE    . TONSILLECTOMY      OB History    Gravida Para Term Preterm AB Living   2 2 2     2    SAB TAB Ectopic Multiple Live Births           2      Social History   Socioeconomic History  . Marital status: Married    Spouse name: None  . Number of children: None  . Years of education: None  . Highest education level: None  Social Needs  . Financial resource strain: None  . Food insecurity - worry: None  . Food insecurity - inability: None  . Transportation needs - medical: None  . Transportation needs - non-medical: None  Occupational History  . None  Tobacco Use  . Smoking status: Former Smoker    Types: Cigarettes    Last attempt to quit: 06/06/2016    Years since quitting: 1.3  . Smokeless tobacco: Never Used  Substance and Sexual Activity  . Alcohol use: No  . Drug use: No  . Sexual activity: Yes    Birth control/protection: None  Other Topics Concern  . None  Social History Narrative  . None    Family History  Problem Relation Age of Onset  . Hyperlipidemia Mother   . Diabetes Father   . Hypertension Father   . Stroke Father 52  . Cancer Paternal Grandfather        skin  . Hypertension  Paternal Grandmother   . Diabetes Paternal Grandmother   . Cancer Maternal Grandmother        ovarian     Current Outpatient Medications:  .  ALPRAZolam (XANAX) 0.5 MG tablet, Take 0.5 mg by mouth as needed for anxiety., Disp: , Rfl:  .  atorvastatin (LIPITOR) 10 MG tablet, Take 1 tablet (10 mg total) by mouth daily., Disp: 30 tablet, Rfl: 2 .  AZO-CRANBERRY PO, Take 1 tablet by mouth daily as needed (for UTI)., Disp: , Rfl:  .  citalopram (CELEXA) 20 MG tablet, Take 1 tablet (20 mg total) by mouth daily., Disp: 30 tablet, Rfl: 11 .  docusate sodium (COLACE) 50 MG capsule, Take 50 mg by mouth daily., Disp: , Rfl:  .  lisinopril (PRINIVIL,ZESTRIL) 20 MG tablet, Take 1 tablet (20 mg total) by mouth at bedtime., Disp: 30 tablet, Rfl: 2 .  metFORMIN (GLUCOPHAGE) 500 MG tablet, Take 1 tablet (500 mg total) by mouth 2 (two) times daily., Disp: 60 tablet, Rfl: 2 .  omeprazole (PRILOSEC) 20 MG capsule, Take 20 mg by mouth daily., Disp: , Rfl:  .  conjugated estrogens (PREMARIN) vaginal cream, Use 1  gram nightly as directed, Disp: 30 g, Rfl: 12 .  nitrofurantoin, macrocrystal-monohydrate, (MACROBID) 100 MG capsule, Take 1 tablet as directed, Disp: 30 capsule, Rfl: 3  Review of Systems  Review of Systems  Constitutional: Negative for fever, chills, weight loss, malaise/fatigue and diaphoresis.  HENT: Negative for hearing loss, ear pain, nosebleeds, congestion, sore throat, neck pain, tinnitus and ear discharge.   Eyes: Negative for blurred vision, double vision, photophobia, pain, discharge and redness.  Respiratory: Negative for cough, hemoptysis, sputum production, shortness of breath, wheezing and stridor.   Cardiovascular: Negative for chest pain, palpitations, orthopnea, claudication, leg swelling and PND.  Gastrointestinal: negative for abdominal pain. Negative for heartburn, nausea, vomiting, diarrhea, constipation, blood in stool and melena.  Genitourinary: Negative for dysuria, urgency,  frequency, hematuria and flank pain.  Musculoskeletal: Negative for myalgias, back pain, joint pain and falls.  Skin: Negative for itching and rash.  Neurological: Negative for dizziness, tingling, tremors, sensory change, speech change, focal weakness, seizures, loss of consciousness, weakness and headaches.  Endo/Heme/Allergies: Negative for environmental allergies and polydipsia. Does not bruise/bleed easily.  Psychiatric/Behavioral: Negative for depression, suicidal ideas, hallucinations, memory loss and substance abuse. The patient is not nervous/anxious and does not have insomnia.        Objective:  Blood pressure 122/82, pulse 91, height 5\' 2"  (1.575 m), weight 208 lb 8 oz (94.6 kg).   Physical Exam  Vitals reviewed. Constitutional: She is oriented to person, place, and time. She appears well-developed and well-nourished.  HENT:  Head: Normocephalic and atraumatic.        Right Ear: External ear normal.  Left Ear: External ear normal.  Nose: Nose normal.  Mouth/Throat: Oropharynx is clear and moist.  Eyes: Conjunctivae and EOM are normal. Pupils are equal, round, and reactive to light. Right eye exhibits no discharge. Left eye exhibits no discharge. No scleral icterus.  Neck: Normal range of motion. Neck supple. No tracheal deviation present. No thyromegaly present.  Cardiovascular: Normal rate, regular rhythm, normal heart sounds and intact distal pulses.  Exam reveals no gallop and no friction rub.   No murmur heard. Respiratory: Effort normal and breath sounds normal. No respiratory distress. She has no wheezes. She has no rales. She exhibits no tenderness.  GI: Soft. Bowel sounds are normal. She exhibits no distension and no mass. There is no tenderness. There is no rebound and no guarding.  Genitourinary:  Breasts no masses skin changes or nipple changes bilaterally      Vulva is normal without lesions Vagina is pink moist without discharge there are some early atrophic  changes occurring however and patient confirms that she is having hot flashes, she states she does not require lubrication with intercourse Cervix absent Uterus is absent Adnexa is  significant for the left ovary adherent to the upper lateral vagina and pelvic sidewall the patient denies pain with intercourse although it is a bit tender with exam today  Musculoskeletal: Normal range of motion. She exhibits no edema and no tenderness.  Neurological: She is alert and oriented to person, place, and time. She has normal reflexes. She displays normal reflexes. No cranial nerve deficit. She exhibits normal muscle tone. Coordination normal.  Skin: Skin is warm and dry. No rash noted. No erythema. No pallor.  Psychiatric: She has a normal mood and affect. Her behavior is normal. Judgment and thought content normal.       Medications Ordered at today's visit: Meds ordered this encounter  Medications  . nitrofurantoin, macrocrystal-monohydrate, (MACROBID) 100  MG capsule    Sig: Take 1 tablet as directed    Dispense:  30 capsule    Refill:  3  . conjugated estrogens (PREMARIN) vaginal cream    Sig: Use 1 gram nightly as directed    Dispense:  30 g    Refill:  12  . lisinopril (PRINIVIL,ZESTRIL) 20 MG tablet    Sig: Take 1 tablet (20 mg total) by mouth at bedtime.    Dispense:  30 tablet    Refill:  2  . metFORMIN (GLUCOPHAGE) 500 MG tablet    Sig: Take 1 tablet (500 mg total) by mouth 2 (two) times daily.    Dispense:  60 tablet    Refill:  2  . citalopram (CELEXA) 20 MG tablet    Sig: Take 1 tablet (20 mg total) by mouth daily.    Dispense:  30 tablet    Refill:  11  . atorvastatin (LIPITOR) 10 MG tablet    Sig: Take 1 tablet (10 mg total) by mouth daily.    Dispense:  30 tablet    Refill:  2    Other orders placed at today's visit: No orders of the defined types were placed in this encounter.     Assessment:    Healthy female exam.   Recurrent UTI Plan:    Contraception:  status post hysterectomy. Mammogram ordered. Follow up in: 1 year. begun on vaginal estrogen and her medications are refilled for 3 months until she can see Anderson Malta for PCP   Will use Macrobid postcoitally to see if that will diminish her urinary tract infections  Return in about 3 months (around 12/25/2017) for follow up with Anderson Malta for PCP.

## 2017-10-29 ENCOUNTER — Ambulatory Visit: Payer: 59 | Admitting: Adult Health

## 2017-11-05 ENCOUNTER — Ambulatory Visit: Payer: 59 | Admitting: Nurse Practitioner

## 2017-11-05 ENCOUNTER — Encounter: Payer: Self-pay | Admitting: Nurse Practitioner

## 2017-11-05 VITALS — BP 140/100 | Ht 62.0 in | Wt 212.0 lb

## 2017-11-05 DIAGNOSIS — I10 Essential (primary) hypertension: Secondary | ICD-10-CM

## 2017-11-05 DIAGNOSIS — F419 Anxiety disorder, unspecified: Secondary | ICD-10-CM

## 2017-11-05 DIAGNOSIS — K219 Gastro-esophageal reflux disease without esophagitis: Secondary | ICD-10-CM | POA: Diagnosis not present

## 2017-11-05 DIAGNOSIS — R5383 Other fatigue: Secondary | ICD-10-CM | POA: Diagnosis not present

## 2017-11-05 DIAGNOSIS — Z13220 Encounter for screening for lipoid disorders: Secondary | ICD-10-CM

## 2017-11-05 DIAGNOSIS — R7303 Prediabetes: Secondary | ICD-10-CM

## 2017-11-05 DIAGNOSIS — Z79899 Other long term (current) drug therapy: Secondary | ICD-10-CM

## 2017-11-05 MED ORDER — HYDROCHLOROTHIAZIDE 25 MG PO TABS: 25.0000 mg | ORAL_TABLET | Freq: Every day | ORAL | 5 refills | Status: DC

## 2017-11-05 NOTE — Progress Notes (Signed)
Subjective:  Presents as a new patient to our practice to establish care. Has been gaining weight slowly. Weighs "more now than she ever has". Worked for [employer] at home for 18 years. Now working at a [employer]. Loves her job and the socialization. Does some walking at work but otherwise no regular activity. Stopped smoking about a year ago. Has cut out sweet tea but still drinks at least 2 regular sodas per day. Takes a very rare Xanax for anxiety. Overall well controlled with Celexa. Takes occasional Omeprazole for reflux. Takes occasional stool softener which has worked well. Started on Metformin but unsure about her sugar or diagnosis. Her father was diabetic. Was on Metoprolol but has stopped this. No history of palpitations. Adherent to medication regimen. Denies CP/ischemic type pain or SOB. No visual changes. No difficulty speaking or swallowing. No numbness or weakness of the face, arms or legs. Has some swelling in the extremities, mainly in the hands. No added salt to her diet. No orthopnea. Gets female physicals at [facility].      Objective:   BP (!) 140/100   Ht 5\' 2"  (1.575 m)   Wt 212 lb (96.2 kg)   BMI 38.78 kg/m  NAD. Alert, oriented. Thoughts logical, coherent and relevant. Dressed appropriately. Making good eye contact. Calm affect. Lungs clear. Heart RRR. No murmur or gallop. BP on recheck left arm sitting 150/98.  Abdomen soft, non distended, very mild epigastric area tenderness. LE: no edema. Significant central obesity noted.   Assessment:   Problem List Items Addressed This Visit      Cardiovascular and Mediastinum   HTN (hypertension) - Primary   Relevant Orders   Basic metabolic panel   Hemoglobin A1c     Digestive   Gastroesophageal reflux disease without esophagitis     Other   Anxiety    Other Visit Diagnoses    Prediabetes       Relevant Orders   Basic metabolic panel   Hemoglobin A1c   Fatigue, unspecified type       Relevant Orders   CBC with  Differential/Platelet   TSH   VITAMIN D 25 Hydroxy (Vit-D Deficiency, Fractures)   Lipid screening       Relevant Orders   Lipid panel   High risk medication use       Relevant Orders   Hepatic function panel        Plan:    Discussed weight loss programs. Recommend cutting out any beverages with sugar, regular activity such as walking.  Meds ordered this encounter  Medications  . hydrochlorothiazide (HYDRODIURIL) 25 MG tablet    Sig: Take 1 tablet (25 mg total) by mouth daily.    Dispense:  30 tablet    Refill:  5    Order Specific Question:   Supervising Provider    Answer:   Mikey Kirschner [[id number]]   Add HCTZ to regimen. Recheck BP a few times per week at her work and send results. Peery need to reconsider restarting metoprolol. Labs pending. Further follow up based on results.

## 2017-11-06 DIAGNOSIS — Z79899 Other long term (current) drug therapy: Secondary | ICD-10-CM | POA: Diagnosis not present

## 2017-11-06 DIAGNOSIS — Z13220 Encounter for screening for lipoid disorders: Secondary | ICD-10-CM | POA: Diagnosis not present

## 2017-11-06 DIAGNOSIS — R7303 Prediabetes: Secondary | ICD-10-CM | POA: Diagnosis not present

## 2017-11-06 DIAGNOSIS — I10 Essential (primary) hypertension: Secondary | ICD-10-CM | POA: Diagnosis not present

## 2017-11-06 DIAGNOSIS — R5383 Other fatigue: Secondary | ICD-10-CM | POA: Diagnosis not present

## 2017-11-08 LAB — CBC WITH DIFFERENTIAL/PLATELET
BASOS: 0 %
Basophils Absolute: 0 10*3/uL (ref 0.0–0.2)
EOS (ABSOLUTE): 0.4 10*3/uL (ref 0.0–0.4)
Eos: 5 %
Hematocrit: 37.7 % (ref 34.0–46.6)
Hemoglobin: 12.5 g/dL (ref 11.1–15.9)
Immature Grans (Abs): 0 10*3/uL (ref 0.0–0.1)
Immature Granulocytes: 0 %
Lymphocytes Absolute: 2.3 10*3/uL (ref 0.7–3.1)
Lymphs: 26 %
MCH: 28.9 pg (ref 26.6–33.0)
MCHC: 33.2 g/dL (ref 31.5–35.7)
MCV: 87 fL (ref 79–97)
MONOS ABS: 0.3 10*3/uL (ref 0.1–0.9)
Monocytes: 4 %
NEUTROS PCT: 65 %
Neutrophils Absolute: 6 10*3/uL (ref 1.4–7.0)
PLATELETS: 313 10*3/uL (ref 150–379)
RBC: 4.33 x10E6/uL (ref 3.77–5.28)
RDW: 13 % (ref 12.3–15.4)
WBC: 9.1 10*3/uL (ref 3.4–10.8)

## 2017-11-08 LAB — HEPATIC FUNCTION PANEL
ALBUMIN: 4.1 g/dL (ref 3.5–5.5)
ALK PHOS: 95 IU/L (ref 39–117)
ALT: 17 IU/L (ref 0–32)
AST: 15 IU/L (ref 0–40)
BILIRUBIN TOTAL: 0.4 mg/dL (ref 0.0–1.2)
BILIRUBIN, DIRECT: 0.13 mg/dL (ref 0.00–0.40)
TOTAL PROTEIN: 6.6 g/dL (ref 6.0–8.5)

## 2017-11-08 LAB — VITAMIN D 25 HYDROXY (VIT D DEFICIENCY, FRACTURES): Vit D, 25-Hydroxy: 31.9 ng/mL (ref 30.0–100.0)

## 2017-11-08 LAB — BASIC METABOLIC PANEL
BUN / CREAT RATIO: 11 (ref 9–23)
BUN: 7 mg/dL (ref 6–24)
CHLORIDE: 103 mmol/L (ref 96–106)
CO2: 22 mmol/L (ref 20–29)
Calcium: 8.9 mg/dL (ref 8.7–10.2)
Creatinine, Ser: 0.66 mg/dL (ref 0.57–1.00)
GFR calc non Af Amer: 108 mL/min/{1.73_m2} (ref >59)
GFR, EST AFRICAN AMERICAN: 124 mL/min/{1.73_m2} (ref >59)
Glucose: 115 mg/dL — ABNORMAL HIGH (ref 65–99)
POTASSIUM: 4.1 mmol/L (ref 3.5–5.2)
SODIUM: 138 mmol/L (ref 134–144)

## 2017-11-08 LAB — LIPID PANEL
Chol/HDL Ratio: 3.4 ratio (ref 0.0–4.4)
Cholesterol, Total: 140 mg/dL (ref 100–199)
HDL: 41 mg/dL (ref >39)
LDL Calculated: 83 mg/dL (ref 0–99)
Triglycerides: 79 mg/dL (ref 0–149)
VLDL CHOLESTEROL CAL: 16 mg/dL (ref 5–40)

## 2017-11-08 LAB — HEMOGLOBIN A1C
ESTIMATED AVERAGE GLUCOSE: 126 mg/dL
Hgb A1c MFr Bld: 6 % — ABNORMAL HIGH (ref 4.8–5.6)

## 2017-11-08 LAB — TSH: TSH: 1.08 u[IU]/mL (ref 0.450–4.500)

## 2017-11-10 ENCOUNTER — Encounter: Payer: Self-pay | Admitting: Nurse Practitioner

## 2017-11-10 DIAGNOSIS — R7303 Prediabetes: Secondary | ICD-10-CM | POA: Insufficient documentation

## 2017-11-16 ENCOUNTER — Encounter: Payer: Self-pay | Admitting: Nurse Practitioner

## 2017-11-16 ENCOUNTER — Other Ambulatory Visit: Payer: Self-pay | Admitting: Nurse Practitioner

## 2017-11-16 MED ORDER — CITALOPRAM HYDROBROMIDE 20 MG PO TABS: 20.0000 mg | ORAL_TABLET | Freq: Every day | ORAL | 1 refills | Status: DC

## 2017-11-16 MED ORDER — METFORMIN HCL 500 MG PO TABS: 500.0000 mg | ORAL_TABLET | Freq: Two times a day (BID) | ORAL | 1 refills | Status: DC

## 2017-11-16 MED ORDER — LISINOPRIL 20 MG PO TABS: 20.0000 mg | ORAL_TABLET | Freq: Every day | ORAL | 1 refills | Status: DC

## 2017-11-16 MED ORDER — HYDROCHLOROTHIAZIDE 25 MG PO TABS: 25.0000 mg | ORAL_TABLET | Freq: Every day | ORAL | 1 refills | Status: DC

## 2017-11-16 MED ORDER — ATORVASTATIN CALCIUM 10 MG PO TABS: 10.0000 mg | ORAL_TABLET | Freq: Every day | ORAL | 1 refills | Status: DC

## 2017-11-16 MED FILL — LISINOPRIL 20 MG TABLET: 20 | 90 days supply | Qty: 90 | Fill #0

## 2017-11-16 MED FILL — CITALOPRAM HBR 20 MG TABLET: 20 | 90 days supply | Qty: 90 | Fill #0

## 2017-11-16 MED FILL — metFORMIN HCL 500 MG TABS: 500 | 90 days supply | Qty: 180 | Fill #0

## 2017-11-16 MED FILL — ATORVASTATIN 10 MG TABLET: 10 | 90 days supply | Qty: 90 | Fill #0

## 2017-11-25 ENCOUNTER — Other Ambulatory Visit: Payer: Self-pay

## 2017-11-25 ENCOUNTER — Emergency Department (HOSPITAL_COMMUNITY)
Admission: EM | Admit: 2017-11-25 | Discharge: 2017-11-25 | Disposition: A | Discharge status: Home / Self Care | Payer: 59 | Attending: Emergency Medicine | Admitting: Emergency Medicine

## 2017-11-25 ENCOUNTER — Encounter (HOSPITAL_COMMUNITY): Payer: Self-pay

## 2017-11-25 DIAGNOSIS — Z79899 Other long term (current) drug therapy: Secondary | ICD-10-CM | POA: Insufficient documentation

## 2017-11-25 DIAGNOSIS — R112 Nausea with vomiting, unspecified: Secondary | ICD-10-CM | POA: Diagnosis not present

## 2017-11-25 DIAGNOSIS — I10 Essential (primary) hypertension: Secondary | ICD-10-CM | POA: Insufficient documentation

## 2017-11-25 DIAGNOSIS — R55 Syncope and collapse: Secondary | ICD-10-CM | POA: Insufficient documentation

## 2017-11-25 DIAGNOSIS — I959 Hypotension, unspecified: Secondary | ICD-10-CM | POA: Diagnosis present

## 2017-11-25 DIAGNOSIS — R404 Transient alteration of awareness: Secondary | ICD-10-CM | POA: Diagnosis not present

## 2017-11-25 DIAGNOSIS — Z87891 Personal history of nicotine dependence: Secondary | ICD-10-CM | POA: Insufficient documentation

## 2017-11-25 LAB — CBC WITH DIFFERENTIAL/PLATELET
BASOS ABS: 0 10*3/uL (ref 0.0–0.1)
Basophils Relative: 0 %
EOS ABS: 0.4 10*3/uL (ref 0.0–0.7)
EOS PCT: 3 %
HCT: 36.9 % (ref 36.0–46.0)
HEMOGLOBIN: 12.1 g/dL (ref 12.0–15.0)
LYMPHS ABS: 2.9 10*3/uL (ref 0.7–4.0)
LYMPHS PCT: 21 %
MCH: 28.9 pg (ref 26.0–34.0)
MCHC: 32.8 g/dL (ref 30.0–36.0)
MCV: 88.3 fL (ref 78.0–100.0)
Monocytes Absolute: 0.6 10*3/uL (ref 0.1–1.0)
Monocytes Relative: 5 %
NEUTROS PCT: 71 %
Neutro Abs: 9.8 10*3/uL — ABNORMAL HIGH (ref 1.7–7.7)
PLATELETS: 331 10*3/uL (ref 150–400)
RBC: 4.18 MIL/uL (ref 3.87–5.11)
RDW: 12.9 % (ref 11.5–15.5)
WBC: 13.8 10*3/uL — AB (ref 4.0–10.5)

## 2017-11-25 LAB — BASIC METABOLIC PANEL
Anion gap: 13 (ref 5–15)
BUN: 20 mg/dL (ref 6–20)
CHLORIDE: 99 mmol/L — AB (ref 101–111)
CO2: 24 mmol/L (ref 22–32)
Calcium: 9.2 mg/dL (ref 8.9–10.3)
Creatinine, Ser: 0.87 mg/dL (ref 0.44–1.00)
GFR calc Af Amer: >60 mL/min (ref >60)
Glucose, Bld: 141 mg/dL — ABNORMAL HIGH (ref 65–99)
POTASSIUM: 3.6 mmol/L (ref 3.5–5.1)
SODIUM: 136 mmol/L (ref 135–145)

## 2017-11-25 MED ORDER — SODIUM CHLORIDE 0.9 % IV BOLUS
1000.0000 mL | Freq: Once | INTRAVENOUS | Status: AC
  Administered 2017-11-25: 1000 mL via INTRAVENOUS

## 2017-11-25 NOTE — Discharge Instructions (Signed)
Make sure that you drink at least six 8 ounce glasses of water each day in order to stay well-hydrated.  Return if your condition worsens for any reason or call Dr.Luking to be seen in the office

## 2017-11-25 NOTE — ED Provider Notes (Addendum)
[facility] EMERGENCY DEPARTMENT Provider Note   CSN: [id number] Arrival date & time: 11/25/17  1619     History   Chief Complaint Chief Complaint  Patient presents with  . Hypotension    HPI Jessica [name] is a 48 y.o. female.  Patient donated blood between 11 and 11:30 AM today.  immeediately after donating blood she felt lightheaded and nauseated.  At approximately 4 PM she vomited 4 or 5 times, nonbloody material and suffered near syncopal event.  She denies any chest pain or abdominal pain.  She feels much improved since arrival here.  No treatment prior to coming here.  No other associated symptoms.  She felt well before donating blood today no other associated symptoms.  Nothing made symptoms better or worse and she feels that she is improving spontaneously  HPI  Past Medical History:  Diagnosis Date  . Anxiety   . Cystitis, chronic   . Gross hematuria   . High cholesterol   . Hypertension   . Obesity     Patient Active Problem List   Diagnosis Date Noted  . Prediabetes 11/10/2017  . Gastroesophageal reflux disease without esophagitis 11/05/2017  . HTN (hypertension) 10/24/2010  . Anxiety 10/24/2010    Past Surgical History:  Procedure Laterality Date  . ABDOMINAL HYSTERECTOMY    . ADENOIDECTOMY    . CARPAL TUNNEL RELEASE    . TONSILLECTOMY       OB History    Gravida  2   Para  2   Term  2   Preterm      AB      Living  2     SAB      TAB      Ectopic      Multiple      Live Births  2            Home Medications    Prior to Admission medications   Medication Sig Start Date End Date Taking? Authorizing Provider  ALPRAZolam Duanne Moron) 0.5 MG tablet Take 0.5 mg by mouth as needed for anxiety.   Yes [provider]  atorvastatin (LIPITOR) 10 MG tablet Take 1 tablet (10 mg total) by mouth daily. 11/16/17  Yes Nilda Simmer, NP  citalopram (CELEXA) 20 MG tablet Take 1 tablet (20 mg total) by mouth daily. 11/16/17  Yes Nilda Simmer, NP  hydrochlorothiazide (HYDRODIURIL) 25 MG tablet Take 1 tablet (25 mg total) by mouth daily. 11/16/17  Yes Nilda Simmer, NP  lisinopril (PRINIVIL,ZESTRIL) 20 MG tablet Take 1 tablet (20 mg total) by mouth at bedtime. 11/16/17  Yes Nilda Simmer, NP  metFORMIN (GLUCOPHAGE) 500 MG tablet Take 1 tablet (500 mg total) by mouth 2 (two) times daily. 11/16/17  Yes Nilda Simmer, NP  omeprazole (PRILOSEC) 20 MG capsule Take 20 mg by mouth daily.   Yes [provider]  conjugated estrogens (PREMARIN) vaginal cream Use 1 gram nightly as directed Patient not taking: Reported on 11/05/2017 09/24/17   Florian Buff, MD    Family History Family History  Problem Relation Age of Onset  . Hyperlipidemia Mother   . Diabetes Father   . Hypertension Father   . Stroke Father 58  . Cancer Paternal Grandfather        skin  . Hypertension Paternal Grandmother   . Diabetes Paternal Grandmother   . Cancer Maternal Grandmother        ovarian    Social History  Social History   Tobacco Use  . Smoking status: Former Smoker    Types: Cigarettes    Last attempt to quit: 06/06/2016    Years since quitting: 1.4  . Smokeless tobacco: Never Used  Substance Use Topics  . Alcohol use: No  . Drug use: No     Allergies   Penicillins and Vioxx [rofecoxib]   Review of Systems Review of Systems  Constitutional: Negative.   HENT: Negative.   Respiratory: Negative.   Cardiovascular: Negative.   Gastrointestinal: Positive for nausea and vomiting.  Musculoskeletal: Negative.   Skin: Negative.   Neurological: Positive for light-headedness.  Psychiatric/Behavioral: Negative.   All other systems reviewed and are negative.    Physical Exam Updated Vital Signs BP 113/90   Pulse (!) 109   Temp 98 F (36.7 C) (Oral)   Resp (!) 21   Ht 5\' 1"  (1.549 m)   Wt 94.8 kg (209 lb)   SpO2 98%   BMI 39.49 kg/m   Physical Exam  Constitutional: She appears well-developed and  well-nourished.  HENT:  Head: Normocephalic and atraumatic.  Eyes: Pupils are equal, round, and reactive to light. Conjunctivae are normal.  Neck: Neck supple. No tracheal deviation present. No thyromegaly present.  Cardiovascular: Normal rate and regular rhythm.  No murmur heard. Pulmonary/Chest: Effort normal and breath sounds normal.  Abdominal: Soft. Bowel sounds are normal. She exhibits no distension. There is no tenderness.  Musculoskeletal: Normal range of motion. She exhibits no edema or tenderness.  Neurological: She is alert. Coordination normal.  Skin: Skin is warm and dry. No rash noted.  Psychiatric: She has a normal mood and affect.  Nursing note and vitals reviewed.    ED Treatments / Results  Labs (all labs ordered are listed, but only abnormal results are displayed) Labs Reviewed  CBC WITH DIFFERENTIAL/PLATELET  BASIC METABOLIC PANEL    EKG EKG Interpretation  Date/Time:  Thursday Munguia 02 2019 16:32:15 EDT Ventricular Rate:  99 PR Interval:    QRS Duration: 86 QT Interval:  369 QTC Calculation: 474 R Axis:   31 Text Interpretation:  Sinus rhythm Low voltage, precordial leads Borderline T abnormalities, diffuse leads No significant change since last tracing Confirmed by Orlie Dakin [phone]) on 11/25/2017 5:02:33 PM   Radiology No results found.  Procedures Procedures (including critical care time)  Medications Ordered in ED Medications  sodium chloride 0.9 % bolus 1,000 mL (1,000 mLs Intravenous New Bag/Given 11/25/17 1653)     Initial Impression / Assessment and Plan / ED Course  I have reviewed the triage vital signs and the nursing notes.  Pertinent labs & imaging results that were available during my care of the patient were reviewed by me and considered in my medical decision making (see chart for details).     5:50 PM patient felt slightly nauseated.  At 6:10 PM she is able to drink water without nausea or vomiting and no longer feels  nauseated.  She is mildly lightheaded on standing but feels ready to go home.  Patient likely suffered vasovagal event.  She suffered suffer from anxiety.  Etiology of vomiting is unclear however I see no signs of infection.  Symptoms onset after donating blood.  Plan encourage oral hydration.  Return as needed see PMD.  Final Clinical Impressions(s) / ED Diagnoses  Diagnoses #1 near syncope #2 nausea and vomiting Final diagnoses:  None    ED Discharge Orders    None       [place], Leverne Amrhein,  MD 11/25/17 [place], MD 11/25/17 2152

## 2017-11-25 NOTE — ED Triage Notes (Signed)
Pt donated blood today around 1130.  Since then has felt weak.  Pt started feeling like she was going to pass out.  Pt vomited and staff called ems.  PT's bp 90/70, pt pale.  CBG 138.  Denies any pain, sob, dizziness, or nausea at this time.

## 2017-11-25 NOTE — ED Notes (Signed)
ED Provider at bedside. 

## 2017-11-27 ENCOUNTER — Encounter: Payer: Self-pay | Admitting: Nurse Practitioner

## 2017-12-07 ENCOUNTER — Other Ambulatory Visit: Payer: Self-pay | Admitting: Nurse Practitioner

## 2017-12-13 ENCOUNTER — Other Ambulatory Visit: Payer: Self-pay | Admitting: Family

## 2017-12-13 ENCOUNTER — Telehealth: Payer: 59 | Admitting: Family

## 2017-12-13 DIAGNOSIS — H60502 Unspecified acute noninfective otitis externa, left ear: Secondary | ICD-10-CM | POA: Diagnosis not present

## 2017-12-13 MED ORDER — CIPROFLOXACIN-HYDROCORTISONE 0.2-1 % OT SUSP: 3.0000 [drp] | Freq: Two times a day (BID) | OTIC | 0 refills | Status: DC

## 2017-12-13 MED ORDER — NEOMYCIN-POLYMYXIN-HC 3.5-10000-1 OT SOLN: 3.0000 [drp] | Freq: Four times a day (QID) | OTIC | 0 refills | Status: DC

## 2017-12-13 NOTE — Addendum Note (Signed)
Addended by: Evelina Dun A on: 12/13/2017 07:56 PM   Modules accepted: Orders

## 2017-12-13 NOTE — Progress Notes (Signed)
E Visit for Swimmer's Ear  We are sorry that you are not feeling well. Here is how we plan to help!  I have prescribed: Ciprofloxin 0.2% and hydrocortisone 1% otic suspension 3 drops in affected ears twice daily until completed   In certain cases swimmer's ear Bochicchio progress to a more serious bacterial infection of the middle or inner ear.  If you have a fever 102 and up and significantly worsening symptoms, this could indicate a more serious infection moving to the middle/inner and needs face to face evaluation in an office by a provider.  Your symptoms should improve over the next 3 days and should resolve in about 7 days.  HOME CARE:   Wash your hands frequently.  Do not place the tip of the bottle on your ear or touch it with your fingers.  You can take Acetominophen 650 mg every 4-6 hours as needed for pain.  If pain is severe or moderate, you can apply a heating pad (set on low) or hot water bottle (wrapped in a towel) to outer ear for 20 minutes.  This will also increase drainage.  Avoid ear plugs  Do not use Q-tips  After showers, help the water run out by tilting your head to one side.  GET HELP RIGHT AWAY IF:   Fever is over 102.2 degrees.  You develop progressive ear pain or hearing loss.  Ear symptoms persist longer than 3 days after treatment.  MAKE SURE YOU:   Understand these instructions.  Will watch your condition.  Will get help right away if you are not doing well or get worse.  TO PREVENT SWIMMER'S EAR:  Use a bathing cap or custom fitted swim molds to keep your ears dry.  Towel off after swimming to dry your ears.  Tilt your head or pull your earlobes to allow the water to escape your ear canal.  If there is still water in your ears, consider using a hairdryer on the lowest setting.  Thank you for choosing an e-visit. Your e-visit answers were reviewed by a board certified advanced clinical practitioner to complete your personal care plan.  Depending upon the condition, your plan could have included both over the counter or prescription medications. Please review your pharmacy choice. Be sure that the pharmacy you have chosen is open so that you can pick up your prescription now.  If there is a problem you Kathan message your provider in MyChart to have the prescription routed to another pharmacy. Your safety is important to us. If you have drug allergies check your prescription carefully.  For the next 24 hours, you can use MyChart to ask questions about today's visit, request a non-urgent call back, or ask for a work or school excuse from your e-visit provider. You will get an email in the next two days asking about your experience. I hope that your e-visit has been valuable and will speed your recovery.      

## 2017-12-15 ENCOUNTER — Other Ambulatory Visit: Payer: Self-pay | Admitting: Nurse Practitioner

## 2017-12-15 DIAGNOSIS — Z79899 Other long term (current) drug therapy: Secondary | ICD-10-CM

## 2017-12-15 MED ORDER — FUROSEMIDE 20 MG PO TABS: 20.0000 mg | ORAL_TABLET | Freq: Every day | ORAL | 0 refills | Status: DC

## 2017-12-17 ENCOUNTER — Other Ambulatory Visit: Payer: Self-pay | Admitting: Nurse Practitioner

## 2017-12-17 MED ORDER — CEFDINIR 300 MG PO CAPS: 300.0000 mg | ORAL_CAPSULE | Freq: Two times a day (BID) | ORAL | 0 refills | Status: DC

## 2017-12-22 ENCOUNTER — Other Ambulatory Visit: Payer: Self-pay | Admitting: Nurse Practitioner

## 2017-12-22 MED ORDER — FLUCONAZOLE 150 MG PO TABS: ORAL_TABLET | ORAL | 0 refills | Status: DC

## 2017-12-23 ENCOUNTER — Ambulatory Visit (INDEPENDENT_AMBULATORY_CARE_PROVIDER_SITE_OTHER): Payer: 59 | Admitting: Otolaryngology

## 2017-12-23 DIAGNOSIS — H60331 Swimmer's ear, right ear: Secondary | ICD-10-CM | POA: Diagnosis not present

## 2018-01-06 ENCOUNTER — Ambulatory Visit (INDEPENDENT_AMBULATORY_CARE_PROVIDER_SITE_OTHER): Payer: 59 | Admitting: Otolaryngology

## 2018-01-06 DIAGNOSIS — H60331 Swimmer's ear, right ear: Secondary | ICD-10-CM | POA: Diagnosis not present

## 2018-01-06 DIAGNOSIS — H93293 Other abnormal auditory perceptions, bilateral: Secondary | ICD-10-CM

## 2018-02-03 ENCOUNTER — Encounter: Payer: Self-pay | Admitting: Nurse Practitioner

## 2018-02-04 ENCOUNTER — Other Ambulatory Visit: Payer: Self-pay | Admitting: Nurse Practitioner

## 2018-02-04 MED ORDER — PHENTERMINE HCL 37.5 MG PO TABS: 37.5000 mg | ORAL_TABLET | Freq: Every day | ORAL | 2 refills | Status: DC

## 2018-02-23 ENCOUNTER — Ambulatory Visit (HOSPITAL_COMMUNITY)
Admission: RE | Admit: 2018-02-23 | Discharge: 2018-02-23 | Disposition: A | Discharge status: Home / Self Care | Payer: 59 | Source: Ambulatory Visit | Attending: Nurse Practitioner | Admitting: Nurse Practitioner

## 2018-02-23 ENCOUNTER — Encounter: Payer: Self-pay | Admitting: Nurse Practitioner

## 2018-02-23 ENCOUNTER — Other Ambulatory Visit: Payer: Self-pay | Admitting: Nurse Practitioner

## 2018-02-23 ENCOUNTER — Ambulatory Visit: Payer: 59 | Admitting: Nurse Practitioner

## 2018-02-23 VITALS — BP 120/82 | Temp 98.5°F | Ht 62.0 in | Wt 196.0 lb

## 2018-02-23 DIAGNOSIS — K219 Gastro-esophageal reflux disease without esophagitis: Secondary | ICD-10-CM

## 2018-02-23 DIAGNOSIS — R1032 Left lower quadrant pain: Secondary | ICD-10-CM | POA: Diagnosis not present

## 2018-02-23 DIAGNOSIS — K5900 Constipation, unspecified: Secondary | ICD-10-CM | POA: Diagnosis not present

## 2018-02-23 DIAGNOSIS — K5903 Drug induced constipation: Secondary | ICD-10-CM | POA: Diagnosis not present

## 2018-02-23 MED ORDER — HYDROCHLOROTHIAZIDE 25 MG PO TABS: 25.0000 mg | ORAL_TABLET | Freq: Every day | ORAL | 1 refills | Status: DC

## 2018-02-23 MED ORDER — OMEPRAZOLE 20 MG PO CPDR: 20.0000 mg | DELAYED_RELEASE_CAPSULE | Freq: Every day | ORAL | 2 refills | Status: DC

## 2018-02-23 MED FILL — HYDROCHLOROTHIAZIDE 25 MG T: 25 | 90 days supply | Qty: 90 | Fill #0

## 2018-02-23 NOTE — Patient Instructions (Addendum)
Dulcolax Magnesium citrate Fleets enema   Food Choices for Gastroesophageal Reflux Disease, Adult When you have gastroesophageal reflux disease (GERD), the foods you eat and your eating habits are very important. Choosing the right foods can help ease your discomfort. What guidelines do I need to follow?  Choose fruits, vegetables, whole grains, and low-fat dairy products.  Choose low-fat meat, fish, and poultry.  Limit fats such as oils, salad dressings, butter, nuts, and avocado.  Keep a food diary. This helps you identify foods that cause symptoms.  Avoid foods that cause symptoms. These Mcneice be different for everyone.  Eat small meals often instead of 3 large meals a day.  Eat your meals slowly, in a place where you are relaxed.  Limit fried foods.  Cook foods using methods other than frying.  Avoid drinking alcohol.  Avoid drinking large amounts of liquids with your meals.  Avoid bending over or lying down until 2-3 hours after eating. What foods are not recommended? These are some foods and drinks that Reichl make your symptoms worse: Vegetables Tomatoes. Tomato juice. Tomato and spaghetti sauce. Chili peppers. Onion and garlic. Horseradish. Fruits Oranges, grapefruit, and lemon (fruit and juice). Meats High-fat meats, fish, and poultry. This includes hot dogs, ribs, ham, sausage, salami, and bacon. Dairy Whole milk and chocolate milk. Sour cream. Cream. Butter. Ice cream. Cream cheese. Drinks Coffee and tea. Bubbly (carbonated) drinks or energy drinks. Condiments Hot sauce. Barbecue sauce. Sweets/Desserts Chocolate and cocoa. Donuts. Peppermint and spearmint. Fats and Oils High-fat foods. This includes [place] fries and potato chips. Other Vinegar. Strong spices. This includes black pepper, white pepper, red pepper, cayenne, curry powder, cloves, ginger, and chili powder. The items listed above Krall not be a complete list of foods and drinks to avoid. Contact your  dietitian for more information. This information is not intended to replace advice given to you by your health care provider. Make sure you discuss any questions you have with your health care provider. Document Released: 01/12/2012 Document Revised: 12/19/2015 Document Reviewed: 05/17/2013 Elsevier Interactive Patient Education  2017 [employer].

## 2018-02-23 NOTE — Progress Notes (Signed)
Subjective: Presents for complaints of abdominal pain for the past 3 days.  Began in the epigastric area with some nausea.  Has radiated down the left side into the left lower quadrant.  No vomiting.  No fever.  Cannot remember when she had her last bowel movement.  Has taken 3 laxatives yesterday with no results.  No urinary symptoms.  No vaginal discharge.  Same sexual partner.  Takes her omeprazole 1 about every 3 months or so.  No overt reflux symptoms.  No caffeine alcohol or tobacco use.  Objective:   BP 120/82   Temp 98.5 F (36.9 C) (Oral)   Ht 5\' 2"  (1.575 m)   Wt 196 lb 0.6 oz (88.9 kg)   BMI 35.86 kg/m  NAD.  Alert, oriented.  Lungs clear.  Heart regular rate and rhythm.  Abdomen soft mildly distended with active bowel sounds x4; distinct epigastric area tenderness.  Tenderness in the lower abdomen more so towards the left lower quadrant.  No rebound or guarding.  No obvious masses.  Assessment:   Problem List Items Addressed This Visit      Digestive   Gastroesophageal reflux disease without esophagitis - Primary   Relevant Medications   omeprazole (PRILOSEC) 20 MG capsule    Other Visit Diagnoses    Left lower quadrant pain       Relevant Orders   DG Abd 1 View (Completed)   Drug-induced constipation           Plan:   Meds ordered this encounter  Medications  . omeprazole (PRILOSEC) 20 MG capsule    Sig: Take 1 capsule (20 mg total) by mouth daily.    Dispense:  30 capsule    Refill:  2    Order Specific Question:   Supervising Provider    Answer:   Mikey Kirschner [[id number]]   Restart omeprazole daily as directed.  Call back in 2 weeks if reflux symptoms persist.  Given information on dietary measures.  Constipation is most likely related to phentermine use.  Hold all medication for now.  Abdominal x-ray to assess for constipation.  Warning signs reviewed including high fever worsening abdominal pain vomiting or bloody stools  Patient to call the office or go to ED  if any problems.  Further follow-up based on x-ray report.  Recommend Dulcolax magnesium citrate or fleets enema for significant constipation.

## 2018-03-03 ENCOUNTER — Ambulatory Visit: Payer: 59 | Admitting: Family Medicine

## 2018-03-03 ENCOUNTER — Ambulatory Visit (HOSPITAL_COMMUNITY)
Admission: RE | Admit: 2018-03-03 | Discharge: 2018-03-03 | Disposition: A | Discharge status: Home / Self Care | Payer: 59 | Source: Ambulatory Visit | Attending: Family Medicine | Admitting: Family Medicine

## 2018-03-03 ENCOUNTER — Other Ambulatory Visit (HOSPITAL_COMMUNITY)
Admission: RE | Admit: 2018-03-03 | Discharge: 2018-03-03 | Disposition: A | Discharge status: Home / Self Care | Payer: 59 | Source: Ambulatory Visit | Attending: Family Medicine | Admitting: Family Medicine

## 2018-03-03 ENCOUNTER — Telehealth: Payer: Self-pay | Admitting: Family Medicine

## 2018-03-03 ENCOUNTER — Other Ambulatory Visit: Payer: Self-pay

## 2018-03-03 ENCOUNTER — Encounter: Payer: Self-pay | Admitting: Family Medicine

## 2018-03-03 DIAGNOSIS — R1031 Right lower quadrant pain: Secondary | ICD-10-CM | POA: Insufficient documentation

## 2018-03-03 DIAGNOSIS — K219 Gastro-esophageal reflux disease without esophagitis: Secondary | ICD-10-CM

## 2018-03-03 DIAGNOSIS — K5730 Diverticulosis of large intestine without perforation or abscess without bleeding: Secondary | ICD-10-CM | POA: Insufficient documentation

## 2018-03-03 DIAGNOSIS — K439 Ventral hernia without obstruction or gangrene: Secondary | ICD-10-CM | POA: Diagnosis not present

## 2018-03-03 DIAGNOSIS — I700 Atherosclerosis of aorta: Secondary | ICD-10-CM | POA: Diagnosis not present

## 2018-03-03 DIAGNOSIS — R109 Unspecified abdominal pain: Secondary | ICD-10-CM | POA: Diagnosis not present

## 2018-03-03 LAB — BASIC METABOLIC PANEL
ANION GAP: 8 (ref 5–15)
BUN: 7 mg/dL (ref 6–20)
CHLORIDE: 105 mmol/L (ref 98–111)
CO2: 25 mmol/L (ref 22–32)
Calcium: 8.8 mg/dL — ABNORMAL LOW (ref 8.9–10.3)
Creatinine, Ser: 0.68 mg/dL (ref 0.44–1.00)
GFR calc Af Amer: >60 mL/min (ref >60)
GFR calc non Af Amer: >60 mL/min (ref >60)
GLUCOSE: 112 mg/dL — AB (ref 70–99)
POTASSIUM: 3.5 mmol/L (ref 3.5–5.1)
Sodium: 138 mmol/L (ref 135–145)

## 2018-03-03 LAB — CBC WITH DIFFERENTIAL/PLATELET
BASOS PCT: 0 %
Basophils Absolute: 0 10*3/uL (ref 0.0–0.1)
EOS ABS: 0.4 10*3/uL (ref 0.0–0.7)
Eosinophils Relative: 4 %
HCT: 38 % (ref 36.0–46.0)
HEMOGLOBIN: 12.3 g/dL (ref 12.0–15.0)
Lymphocytes Relative: 25 %
Lymphs Abs: 2.4 10*3/uL (ref 0.7–4.0)
MCH: 28.9 pg (ref 26.0–34.0)
MCHC: 32.4 g/dL (ref 30.0–36.0)
MCV: 89.4 fL (ref 78.0–100.0)
Monocytes Absolute: 0.6 10*3/uL (ref 0.1–1.0)
Monocytes Relative: 6 %
NEUTROS PCT: 65 %
Neutro Abs: 6.2 10*3/uL (ref 1.7–7.7)
PLATELETS: 267 10*3/uL (ref 150–400)
RBC: 4.25 MIL/uL (ref 3.87–5.11)
RDW: 13 % (ref 11.5–15.5)
WBC: 9.6 10*3/uL (ref 4.0–10.5)

## 2018-03-03 LAB — HEPATIC FUNCTION PANEL
ALBUMIN: 4.1 g/dL (ref 3.5–5.0)
ALK PHOS: 71 U/L (ref 38–126)
ALT: 20 U/L (ref 0–44)
AST: 18 U/L (ref 15–41)
Bilirubin, Direct: 0.1 mg/dL (ref 0.0–0.2)
Indirect Bilirubin: 0.5 mg/dL (ref 0.3–0.9)
TOTAL PROTEIN: 7.6 g/dL (ref 6.5–8.1)
Total Bilirubin: 0.6 mg/dL (ref 0.3–1.2)

## 2018-03-03 MED ORDER — IOPAMIDOL (ISOVUE-300) INJECTION 61%
100.0000 mL | Freq: Once | INTRAVENOUS | Status: AC | PRN
  Administered 2018-03-03: 100 mL via INTRAVENOUS

## 2018-03-03 MED ORDER — ONDANSETRON 4 MG PO TBDP: 4.0000 mg | ORAL_TABLET | Freq: Three times a day (TID) | ORAL | 0 refills | Status: DC | PRN

## 2018-03-03 NOTE — Telephone Encounter (Signed)
Patient saw Chrys Racer on 7-31 for GI Abdominal pain, constipation, nausea.  She had mentioned referring her to GI doctor.  Patient would like to be referred.  She tried to call Dr. Roseanne Kaufman office but she can't make an appt herself without being referred by pcp.  Doesn't need referral for her insurance, just for the office  Leave message for patient or send MyChart message since she is at work.

## 2018-03-03 NOTE — Progress Notes (Signed)
   Subjective:    Patient ID: Jessica [name], female    DOB: [birth year], 47 y.o.   MRN: [id number]  HPI  Patient is here today with ongoing stomach issues. She states she still has some nausea and vomiting . She states she has some pain that comes and goes.Ongoing for two weeks. She has tried exlax and enema,prilosec,abdominal xray. Needs a referral to a GI specialist.    Came to [facility] [place] wk  Had pretty rough abdom pain started epigastric and then moved down   Car thoiught adic reflx was acting up  Told to use prilose qd  Pt also had contip  Confirmed by xray  Pt took a couple dose of laxative and then an enema,  Still no bowel movement , cramping comes and goes yest morn fine  Appetite not as good   pt had fairly substsntial pain with cramping yesterday  No fever   Pos vomiting   At times tendecny towards constipation in the past   Has been hospitlazied as a kid for constipation  Dulcolax tas three two day in a row  has had some back pain      Please see phone messages also      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      Review of Systems No headache, no major weight loss or weight gain, no chest pain no back pain abdominal pain no change in bowel habits complete ROS otherwise negative     Objective:   Physical Exam  Alert and oriented, vitals reviewed and stable, NAD ENT-TM's and ext canals WNL bilat via otoscopic exam Soft palate, tonsils and post pharynx WNL via oropharyngeal exam Neck-symmetric, no masses; thyroid nonpalpable and nontender Pulmonary-no tachypnea or accessory muscle use; Clear without wheezes via auscultation Card--no abnrml murmurs, rhythm reg and rate WNL Carotid pulses symmetric, without bruits Abdomen diffuse tenderness.  Positive right lower quadrant positive right upper quadrant.  Bowel sounds present.  No obvious masses.      Assessment & Plan:  Impression progressive abdominal pain.  With some concerning features.  See prior  notes.  Long discussion held.  Stat blood work ordered this returned negative.  In addition CT scan abdomen pelvis revealed no surgical process.  Zofran.  Maintain other meds.  Follow-up with GI specialist as noted results discussed with patient.   Greater than 50% of this 25 minute face to face visit was spent in counseling and discussion and coordination of care regarding the above diagnosis/diagnosies

## 2018-03-03 NOTE — Telephone Encounter (Signed)
Patient is states she will be here today at 1pm

## 2018-03-03 NOTE — Telephone Encounter (Signed)
Call pt. See my mychart statement. EiMy rec from Monday still stands, either ER immediately or see me today, a gi referral with the current sympotmatology pt is referring to could be a serious urgent concern, referral to a p GI folks generally takes several weeks. Next available appt, today, will disc phentermine thern briefly but main focus ins on ongoing g I concerns

## 2018-03-09 ENCOUNTER — Encounter: Payer: Self-pay | Admitting: Family Medicine

## 2018-04-19 ENCOUNTER — Other Ambulatory Visit: Payer: Self-pay

## 2018-04-19 ENCOUNTER — Encounter: Payer: Self-pay | Admitting: Family Medicine

## 2018-04-20 ENCOUNTER — Telehealth: Payer: Self-pay | Admitting: *Deleted

## 2018-04-20 NOTE — Telephone Encounter (Signed)
Metoprolol is not on med list. Pt last seen April for htn. Called pt she states it was prescribed in [place] but she has not been taking for about 3 months now and feels fine without it. Advised pt she needs follow up visit in October for htn and note would be sent to doctor about metoprolol.

## 2018-04-20 NOTE — Telephone Encounter (Signed)
Pt sent my chart message asking if she needs to take metoprolol or not. I called pt and got more info  Metoprolol is not on med list. Pt last seen April for htn. Called pt she states it was prescribed in [place] but she has not been taking for about 3 months now and feels fine without it. Advised pt she needs follow up visit in October for htn and note would be sent to doctor about metoprolol.

## 2018-04-20 NOTE — Telephone Encounter (Signed)
rec hold off for now will see how doing then

## 2018-04-21 NOTE — Telephone Encounter (Signed)
Pt contacted and informed to hold of on metoprolol. Pt was transferred up front to schedule follow up office visit.

## 2018-04-21 NOTE — Telephone Encounter (Signed)
See phone message from 04/20/18

## 2018-05-03 MED ORDER — FUROSEMIDE 20 MG PO TABS: 20.0000 mg | ORAL_TABLET | Freq: Every day | ORAL | 0 refills | Status: DC

## 2018-05-04 ENCOUNTER — Telehealth: Payer: Self-pay | Admitting: Family Medicine

## 2018-05-04 ENCOUNTER — Encounter: Payer: Self-pay | Admitting: Family Medicine

## 2018-05-04 ENCOUNTER — Other Ambulatory Visit: Payer: Self-pay

## 2018-05-04 ENCOUNTER — Ambulatory Visit: Payer: 59 | Admitting: Family Medicine

## 2018-05-04 VITALS — BP 132/90 | Temp 98.3°F | Ht 62.0 in | Wt 205.0 lb

## 2018-05-04 DIAGNOSIS — J069 Acute upper respiratory infection, unspecified: Secondary | ICD-10-CM | POA: Diagnosis not present

## 2018-05-04 MED ORDER — ATORVASTATIN CALCIUM 10 MG PO TABS: 10.0000 mg | ORAL_TABLET | Freq: Every day | ORAL | 1 refills | Status: DC

## 2018-05-04 MED FILL — ATORVASTATIN 10 MG TABLET: 10 | 90 days supply | Qty: 90 | Fill #0

## 2018-05-04 NOTE — Telephone Encounter (Signed)
Pt is needing refill on atorvastatin (LIPITOR) 10 MG tablet. Please send to [facility], [place].   Pt has follow up appt on 10/21

## 2018-05-04 NOTE — Telephone Encounter (Signed)
Medication sent in and patient is aware.  

## 2018-05-04 NOTE — Progress Notes (Signed)
   Subjective:    Patient ID: Jessica [name], female    DOB: [birth year], 47 y.o.   MRN: [id number]  Cough  This is a new problem. The current episode started in the past 7 days. The cough is productive of sputum. Associated symptoms include chills, ear pain, a fever, headaches and a sore throat. Pertinent negatives include no shortness of breath. Associated symptoms comments: Cant get warm; pt states from her waist up hurts. Treatments tried: Nyquil. The treatment provided mild (helps her sleep) relief.   States woke up Monday and felt very tired and weak. Yesterday with significant malaise and chills.  Woke up this morning with raspy voice, sore throat, and achey from waist up, bilateral otalgia, frontal headache, intermittent cough. States hasn't checked temperature at home but typically warm natured and has been having chills. Reports productive cough this morning of brown/green mucous.    Review of Systems  Constitutional: Positive for chills and fever.  HENT: Positive for ear pain and sore throat. Negative for congestion and ear discharge.   Eyes: Negative for discharge.  Respiratory: Positive for cough. Negative for shortness of breath.   Gastrointestinal: Negative for diarrhea, nausea and vomiting.  Neurological: Positive for headaches.      Objective:   Physical Exam  Constitutional: She is oriented to person, place, and time. She appears well-developed and well-nourished. No distress.  HENT:  Head: Normocephalic and atraumatic.  Right Ear: Tympanic membrane normal.  Left Ear: Tympanic membrane normal.  Nose: Right sinus exhibits frontal sinus tenderness. Right sinus exhibits no maxillary sinus tenderness. Left sinus exhibits frontal sinus tenderness. Left sinus exhibits no maxillary sinus tenderness.  Mouth/Throat: Uvula is midline. Posterior oropharyngeal erythema present.  Eyes: Conjunctivae are normal. Right eye exhibits no discharge. Left eye exhibits no discharge.  Neck: Neck  supple.  Cardiovascular: Normal rate, regular rhythm and normal heart sounds.  No murmur heard. Pulmonary/Chest: Effort normal and breath sounds normal. No respiratory distress. She has no wheezes.  Lymphadenopathy:    She has no cervical adenopathy.  Neurological: She is alert and oriented to person, place, and time.  Skin: Skin is warm and dry.  Psychiatric: She has a normal mood and affect.  Nursing note and vitals reviewed.     Assessment & Plan:  Viral upper respiratory infection Strongly feel that this is a viral infection. Recommended decongestant nasal spray 2-3 days only. [place]ft use saline nasal sprays and tylenol prn. She should f/u if her symptoms worsen over the next few days and sinus pain is worse on Friday, at that time Hosie consider an antibiotic. Recommended rest, increased fluids, WE given for today.  As attending physician to this patient visit, this patient was seen in conjunction with the nurse practitioner.  The history,physical and treatment plan was reviewed with the nurse practitioner and pertinent findings were verified with the patient.  Also the treatment plan was reviewed with the patient while they were present. WSL MD

## 2018-05-04 NOTE — Patient Instructions (Addendum)
Hayton take decongestant nasal spray for 2-3 days. Use tylenol as needed for fever. Recommend taking 1-2 days off work to rest. If symptoms are worse come Friday let us know.

## 2018-05-06 ENCOUNTER — Other Ambulatory Visit: Payer: Self-pay | Admitting: Family Medicine

## 2018-05-06 ENCOUNTER — Telehealth: Payer: Self-pay | Admitting: Family Medicine

## 2018-05-06 MED ORDER — CEFDINIR 300 MG PO CAPS: ORAL_CAPSULE | ORAL | 0 refills | Status: DC

## 2018-05-06 NOTE — Telephone Encounter (Signed)
Left message to return call 

## 2018-05-06 NOTE — Telephone Encounter (Signed)
Patient states her congestion is worst,sorethroat,cant sleep, nose burning. She was seen 10/9.[facility] [place]

## 2018-05-06 NOTE — Telephone Encounter (Signed)
Patient with sinus symptoms and would like medication sent in

## 2018-05-06 NOTE — Telephone Encounter (Signed)
Patient calling back about phone message from this morning.  I told her that from the notes it looks like we are going to send in an antibiotic this afternoon.  After I told her that she asked for cough medicine as well since she said she can not sleep at night for coughing. (Does not like Tesslon pearls)

## 2018-05-06 NOTE — Telephone Encounter (Signed)
Reviewed with Dr. Nicki Reaper, patient was seen on 10/9, symptoms worse which could indicate possible sinus infection needing an antibiotic.  [place] prescribe Omnicef 300 mg twice daily x7-days, #14, R-0.  She should follow-up if her symptoms worsen or persist despite antibiotic use.

## 2018-05-06 NOTE — Telephone Encounter (Signed)
Please advise. ABT sent in after lunch

## 2018-05-06 NOTE — Telephone Encounter (Signed)
[facility] 10/11

## 2018-05-06 NOTE — Telephone Encounter (Signed)
Medication sent in. Left message to return call 

## 2018-05-06 NOTE — Telephone Encounter (Signed)
Coco take otc robitussin DM at night as needed for cough.

## 2018-05-09 NOTE — Telephone Encounter (Signed)
Left message to return call 

## 2018-05-09 NOTE — Telephone Encounter (Signed)
See other message in pt call. Left message to return call

## 2018-05-09 NOTE — Telephone Encounter (Signed)
See other message as well in pt call

## 2018-05-13 NOTE — Telephone Encounter (Signed)
Patient states she picked up the medications and is doing some better. She will call our office if she feels she needs anything further.

## 2018-05-16 ENCOUNTER — Ambulatory Visit: Payer: 59 | Admitting: Family Medicine

## 2018-05-24 ENCOUNTER — Telehealth: Payer: 59 | Admitting: Nurse Practitioner

## 2018-05-24 DIAGNOSIS — N3000 Acute cystitis without hematuria: Secondary | ICD-10-CM

## 2018-05-24 MED ORDER — NITROFURANTOIN MONOHYD MACRO 100 MG PO CAPS: 100.0000 mg | ORAL_CAPSULE | Freq: Two times a day (BID) | ORAL | 0 refills | Status: DC

## 2018-05-24 NOTE — Progress Notes (Signed)

## 2018-06-20 ENCOUNTER — Other Ambulatory Visit: Payer: Self-pay | Admitting: Family Medicine

## 2018-06-20 ENCOUNTER — Ambulatory Visit (INDEPENDENT_AMBULATORY_CARE_PROVIDER_SITE_OTHER): Payer: Self-pay | Admitting: Obstetrics and Gynecology

## 2018-06-20 VITALS — BP 140/90 | HR 86 | Temp 98.6°F | Resp 16 | Wt 207.8 lb

## 2018-06-20 DIAGNOSIS — H6591 Unspecified nonsuppurative otitis media, right ear: Secondary | ICD-10-CM | POA: Insufficient documentation

## 2018-06-20 DIAGNOSIS — J069 Acute upper respiratory infection, unspecified: Secondary | ICD-10-CM | POA: Insufficient documentation

## 2018-06-20 DIAGNOSIS — M549 Dorsalgia, unspecified: Secondary | ICD-10-CM

## 2018-06-20 DIAGNOSIS — H6690 Otitis media, unspecified, unspecified ear: Secondary | ICD-10-CM

## 2018-06-20 LAB — POCT URINALYSIS DIPSTICK
Bilirubin, UA: NEGATIVE
Glucose, UA: NEGATIVE
KETONES UA: NEGATIVE
Leukocytes, UA: NEGATIVE
NITRITE UA: NEGATIVE
PH UA: 6.5 (ref 5.0–8.0)
PROTEIN UA: NEGATIVE
SPEC GRAV UA: 1.01 (ref 1.010–1.025)
UROBILINOGEN UA: 0.2 U/dL

## 2018-06-20 MED ORDER — CEFDINIR 300 MG PO CAPS
300.0000 mg | ORAL_CAPSULE | Freq: Two times a day (BID) | ORAL | 0 refills | Status: DC
Start: 2018-06-20 — End: 2018-07-01

## 2018-06-20 NOTE — Progress Notes (Signed)
  Subjective:     Patient ID: Jessica [name], female   DOB: August 28, 1970, 47 y.o.   MRN: [id number]  HPI  [name] is a 47 y.o. female here with sinus pressure, chills, back pain and nasal congestion. Symptoms started 1 day ago. The first thing she felt was HA. Today she left work due to the symptoms. Says she woke up and her bed and clothes were wet so she assumes she had a fever. Says she stayed in bed all day. She has not tried anything over the counter for the symptoms. Says her right ear feels bulging. She has had problems with this ear in the past. Some back pain that started around the same time. "Berlanga be the start of a UTI".   Review of Systems  Constitutional: Positive for chills, diaphoresis and fatigue.  HENT: Positive for congestion, sinus pressure and sinus pain. Negative for sore throat and voice change.   Respiratory: Negative for shortness of breath.   Gastrointestinal: Negative for abdominal pain.  Musculoskeletal: Positive for back pain.   Objective:   Physical Exam  Constitutional: She is oriented to person, place, and time. She appears well-developed and well-nourished.  Non-toxic appearance. She has a sickly appearance. She does not appear ill. No distress.  HENT:  Right Ear: There is tenderness. Tympanic membrane is injected, erythematous and bulging. A middle ear effusion is present.  Left Ear: Hearing, tympanic membrane, external ear and ear canal normal.  Eyes: Pupils are equal, round, and reactive to light.  Neck: Normal range of motion.  Pulmonary/Chest: Effort normal and breath sounds normal. No stridor. No respiratory distress. She has no wheezes. She has no rales. She exhibits no tenderness.  Musculoskeletal: Normal range of motion.       Lumbar back: Normal. She exhibits normal range of motion and no tenderness.       Back:  Mild TTP right flank.  Neurological: She is alert and oriented to person, place, and time.  Skin: Skin is warm and dry. She is not  diaphoretic.  Psychiatric: Her behavior is normal.   Assessment:   1. Acute otitis media, unspecified otitis media type   2. Back pain, unspecified back location, unspecified back pain laterality, unspecified chronicity   3. URI, acute    Plan:   - UA normal other than hgb, if back pain persists see PCP for urine culture.  - Rx: Cefdinir, if no improvement see PCP - Cool mist humidifier  - OTC ibuprofen as directed on the bottle - Increase rest and fluid    Jessica [name], Artist Pais, NP 06/20/18 5:12 PM

## 2018-06-21 MED ORDER — FUROSEMIDE 20 MG PO TABS: 20.0000 mg | ORAL_TABLET | Freq: Every day | ORAL | 0 refills | Status: DC

## 2018-06-21 MED FILL — FUROSEMIDE 20 MG TABS: 20 | 30 days supply | Qty: 30 | Fill #0

## 2018-06-30 ENCOUNTER — Telehealth: Payer: 59 | Admitting: Family Medicine

## 2018-06-30 ENCOUNTER — Encounter: Payer: Self-pay | Admitting: Family Medicine

## 2018-06-30 DIAGNOSIS — R509 Fever, unspecified: Secondary | ICD-10-CM

## 2018-06-30 NOTE — Telephone Encounter (Signed)
Unfortunately this is not something that we have seen Therefore it would require an office visit

## 2018-06-30 NOTE — Progress Notes (Signed)
Based on what you shared with me it looks like you have a condition that should be evaluated in a face to face office visit.  NOTE: If you entered your credit card information for this eVisit, you will not be charged. You Orton see a "hold" on your card for the $30 but that hold will drop off and you will not have a charge processed.  If you are having a true medical emergency please call 911.  If you need an urgent face to face visit, [facility] has four urgent care centers for your convenience.  If you need care fast and have a high deductible or no insurance consider:   [email or web address] to reserve your spot online an avoid wait times  [facility] [address], [address] [place], [place] [address] 8 am to 8 pm Monday-Friday 10 am to 4 pm Saturday-Sunday *Across the street from [employer]  [facility]  [address] [place], [address] 8 am to 5 pm Monday-Friday * In the [facility] the [facility]   The following sites will take your  insurance:  . [facility]  [phone] Get Driving Directions Find a Provider at this Location  [address] [place], [place] [address] . 10 am to 8 pm Monday-Friday . 12 pm to 8 pm Saturday-Sunday   . [facility]  [phone] Get Driving Directions Find a Provider at this Location  [address], [address] [place], [place] [address] . 8 am to 8 pm Monday-Friday . 9 am to 6 pm Saturday . 11 am to 6 pm Sunday   . [facility] [place]  [phone] Get Driving Directions  [address] [place], [place] [address] . 8 am to 8 pm Monday-Friday . 8 am to 4 pm Saturday-Sunday   Your e-visit answers were reviewed by a board certified advanced clinical practitioner to complete your personal care plan.  Thank you for using e-Visits. 

## 2018-07-01 ENCOUNTER — Ambulatory Visit: Payer: 59 | Admitting: Family Medicine

## 2018-07-01 VITALS — Temp 98.3°F | Ht 62.0 in | Wt 209.0 lb

## 2018-07-01 DIAGNOSIS — B9689 Other specified bacterial agents as the cause of diseases classified elsewhere: Secondary | ICD-10-CM

## 2018-07-01 DIAGNOSIS — J019 Acute sinusitis, unspecified: Secondary | ICD-10-CM

## 2018-07-01 DIAGNOSIS — H65111 Acute and subacute allergic otitis media (mucoid) (sanguinous) (serous), right ear: Secondary | ICD-10-CM

## 2018-07-01 MED ORDER — LEVOFLOXACIN 500 MG PO TABS: 500.0000 mg | ORAL_TABLET | Freq: Every day | ORAL | 0 refills | Status: DC

## 2018-07-01 MED ORDER — HYDROCODONE-HOMATROPINE 5-1.5 MG/5ML PO SYRP: 5.0000 mL | ORAL_SOLUTION | Freq: Four times a day (QID) | ORAL | 0 refills | Status: AC | PRN

## 2018-07-01 NOTE — Progress Notes (Signed)
   Subjective:    Patient ID: Jessica [name], female    DOB: [birth year], 47 y.o.   MRN: [id number]  Cough  This is a new problem. The current episode started 1 to 4 weeks ago. Associated symptoms include ear pain, headaches, nasal congestion, rhinorrhea and a sore throat. Pertinent negatives include no chest pain, fever, shortness of breath or wheezing. Treatments tried: just finishe omnicef 300mg  from urgent care.   Patient also has yeast infection from the [facility] 300mg  she just finished from urgent care Patient relates that she has had a little bit of head congestion drainage coughing she also relates some nausea but no vomiting in addition to this she relates a lot of sinus pressure pain right ear pain as well  Review of Systems  Constitutional: Negative for activity change and fever.  HENT: Positive for congestion, ear pain, rhinorrhea and sore throat.   Eyes: Negative for discharge.  Respiratory: Positive for cough. Negative for shortness of breath and wheezing.   Cardiovascular: Negative for chest pain.  Neurological: Positive for headaches.       Objective:   Physical Exam  Constitutional: She appears well-developed.  HENT:  Head: Normocephalic.  Nose: Nose normal.  Mouth/Throat: Oropharynx is clear and moist. No oropharyngeal exudate.  Neck: Neck supple.  Cardiovascular: Normal rate and normal heart sounds.  No murmur heard. Pulmonary/Chest: Effort normal and breath sounds normal. She has no wheezes.  Lymphadenopathy:    She has no cervical adenopathy.  Skin: Skin is warm and dry.  Nursing note and vitals reviewed.  Her ear on the right side shows fluid behind along with changes in the eardrum itself I told the patient that if she is not completely well within 2 weeks she got to have this rechecked and if it is still persistent changes referral to ENT       Assessment & Plan:  Patient was seen today for upper respiratory illness. It is felt that the patient is dealing  with sinusitis.  Antibiotics were prescribed today. Importance of compliance with medication was discussed.  Symptoms should gradually resolve over the course of the next several days. If high fevers, progressive illness, difficulty breathing, worsening condition or failure for symptoms to improve over the next several days then the patient is to follow-up.  If any emergent conditions the patient is to follow-up in the emergency department otherwise to follow-up in the office.  Right otitis media Abnormal ear exam If ongoing troubles referral to ENT Antibiotics sent in

## 2018-07-12 ENCOUNTER — Encounter: Payer: Self-pay | Admitting: Family Medicine

## 2018-07-12 MED ORDER — CLARITHROMYCIN 500 MG PO TABS: 500.0000 mg | ORAL_TABLET | Freq: Two times a day (BID) | ORAL | 0 refills | Status: AC

## 2018-07-13 MED FILL — LISINOPRIL 20 MG TABLET: 20 | 90 days supply | Qty: 90 | Fill #1

## 2018-07-13 MED FILL — CITALOPRAM HBR 20 MG TABLET: 20 | 90 days supply | Qty: 90 | Fill #1

## 2018-07-15 MED FILL — ATORVASTATIN 10 MG TABLET: 10 | 90 days supply | Qty: 90 | Fill #1

## 2018-07-21 ENCOUNTER — Encounter: Payer: Self-pay | Admitting: Family Medicine

## 2018-07-21 NOTE — Telephone Encounter (Signed)
It would be advisable for this patient to be seen there is too much potential for something else going on  Needs follow-up office visit with Dr. Richardson Landry on Friday

## 2018-07-21 NOTE — Telephone Encounter (Signed)
Seen 07/01/18. Dx - right otitis media and prescribed levaquin

## 2018-07-22 ENCOUNTER — Ambulatory Visit: Payer: 59 | Admitting: Family Medicine

## 2018-07-22 ENCOUNTER — Encounter: Payer: Self-pay | Admitting: Family Medicine

## 2018-07-22 VITALS — Temp 97.6°F | Wt 210.6 lb

## 2018-07-22 DIAGNOSIS — B9689 Other specified bacterial agents as the cause of diseases classified elsewhere: Secondary | ICD-10-CM | POA: Diagnosis not present

## 2018-07-22 DIAGNOSIS — J019 Acute sinusitis, unspecified: Secondary | ICD-10-CM | POA: Diagnosis not present

## 2018-07-22 DIAGNOSIS — H6591 Unspecified nonsuppurative otitis media, right ear: Secondary | ICD-10-CM | POA: Diagnosis not present

## 2018-07-22 MED ORDER — DOXYCYCLINE HYCLATE 100 MG PO CAPS: 100.0000 mg | ORAL_CAPSULE | Freq: Two times a day (BID) | ORAL | 0 refills | Status: DC

## 2018-07-22 NOTE — Progress Notes (Signed)
   Subjective:    Patient ID: Jessica [name], female    DOB: 12-24-1970, 47 y.o.   MRN: [id number]  Sinusitis  This is a recurrent problem. The current episode started more than 1 month ago. Associated symptoms include congestion, coughing, ear pain, headaches and sinus pressure. Pertinent negatives include no shortness of breath. (Pt states she feels like she is in a fog) Past treatments include antibiotics. The treatment provided no relief.   She reports that Dr. Camillia Herter Dr. Moshe Cipro looked at the ear a while back stated started look better The patient is having ongoing troubles with her ear feels full and pressure denies severe pain relates yesterday had swelling underneath the eyes   Review of Systems  Constitutional: Negative for activity change and fever.  HENT: Positive for congestion, ear pain, rhinorrhea and sinus pressure.   Eyes: Negative for discharge.  Respiratory: Positive for cough. Negative for shortness of breath and wheezing.   Cardiovascular: Negative for chest pain.  Neurological: Positive for headaches.       Objective:   Physical Exam Vitals signs and nursing note reviewed.  Constitutional:      Appearance: She is well-developed.  HENT:     Head: Normocephalic.     Nose: Nose normal.     Mouth/Throat:     Pharynx: No oropharyngeal exudate.  Neck:     Musculoskeletal: Neck supple.  Cardiovascular:     Rate and Rhythm: Normal rate.     Heart sounds: Normal heart sounds. No murmur.  Pulmonary:     Effort: Pulmonary effort is normal.     Breath sounds: Normal breath sounds. No wheezing.  Lymphadenopathy:     Cervical: No cervical adenopathy.  Skin:    General: Skin is warm and dry.   No evidence of swelling today the ear does show air-fluid level but is clear.        Assessment & Plan:  Patient was seen today for upper respiratory illness. It is felt that the patient is dealing with sinusitis.  Antibiotics were prescribed today. Importance of  compliance with medication was discussed.  Symptoms should gradually resolve over the course of the next several days. If high fevers, progressive illness, difficulty breathing, worsening condition or failure for symptoms to improve over the next several days then the patient is to follow-up.  If any emergent conditions the patient is to follow-up in the emergency department otherwise to follow-up in the office.  There is some air-fluid level behind the right ear but it is clear no sign of a current ear infection more likely related to eustachian tube dysfunction should gradually get better patient will send [place] my chart message in a couple weeks time if it is not well by then I recommend ENT referral

## 2018-08-08 ENCOUNTER — Encounter: Payer: Self-pay | Admitting: Family Medicine

## 2018-08-08 DIAGNOSIS — H65111 Acute and subacute allergic otitis media (mucoid) (sanguinous) (serous), right ear: Secondary | ICD-10-CM

## 2018-08-08 DIAGNOSIS — H6591 Unspecified nonsuppurative otitis media, right ear: Secondary | ICD-10-CM

## 2018-08-19 ENCOUNTER — Encounter: Payer: Self-pay | Admitting: Family Medicine

## 2018-09-23 ENCOUNTER — Ambulatory Visit (INDEPENDENT_AMBULATORY_CARE_PROVIDER_SITE_OTHER): Payer: No Typology Code available for payment source | Admitting: Family Medicine

## 2018-09-23 ENCOUNTER — Other Ambulatory Visit: Payer: Self-pay | Admitting: Family Medicine

## 2018-09-23 ENCOUNTER — Encounter: Payer: Self-pay | Admitting: Family Medicine

## 2018-09-23 DIAGNOSIS — I10 Essential (primary) hypertension: Secondary | ICD-10-CM

## 2018-09-23 DIAGNOSIS — R7303 Prediabetes: Secondary | ICD-10-CM

## 2018-09-23 DIAGNOSIS — Z79899 Other long term (current) drug therapy: Secondary | ICD-10-CM

## 2018-09-23 MED ORDER — ALPRAZOLAM 0.5 MG PO TABS: 0.5000 mg | ORAL_TABLET | Freq: Every day | ORAL | 0 refills | Status: DC | PRN

## 2018-09-23 NOTE — Progress Notes (Signed)
   Subjective:    Patient ID: Jessica [name], female    DOB: 1970/09/18, 48 y.o.   MRN: [id number]  HPI Pt here today to discuss weight loss. Pt would like to talk about options for weight loss. Pt had sent a mychart message on 09/20/2018 but it is not in chart.   Has done weight watchers previously but became expensive - states lost 15 lbs when she was on that the end of last year. States she is watching what she is eating but not losing any weight. Bought a bicycle. Drinking 2 cans of soda a day, working on limiting starches and decreasing fried foods. Taking steps at work and trying to park further. Does yard work. No regular exercise.      Pt also is suppose to be taking HCTZ and Furosemide. Pt has not been taking Furosemide; pt would like to come off of Furosemide.   Pt would like refill on Xanax also. Pt states that Xanax was prescribed by another doctor. Pt was prescribed Xanax by previous NP and then switched to our office. Pt states she only takes them as needed, very rare use usually just with travel.   Review of Systems  Constitutional: Negative for activity change, appetite change, fever and unexpected weight change.  Respiratory: Negative for shortness of breath.   Cardiovascular: Negative for chest pain and leg swelling.  Gastrointestinal: Negative for abdominal pain.       Objective:   Physical Exam Vitals signs and nursing note reviewed.  Constitutional:      General: She is not in acute distress.    Appearance: She is well-developed. She is obese.  HENT:     Head: Normocephalic and atraumatic.  Neck:     Musculoskeletal: Neck supple.  Cardiovascular:     Rate and Rhythm: Normal rate and regular rhythm.     Heart sounds: Normal heart sounds. No murmur.  Pulmonary:     Effort: Pulmonary effort is normal. No respiratory distress.     Breath sounds: Normal breath sounds.  Musculoskeletal:     Right lower leg: No edema.     Left lower leg: No edema.  Skin:  General: Skin is warm and dry.  Neurological:     Mental Status: She is alert and oriented to person, place, and time.  Psychiatric:        Mood and Affect: Mood normal.        Behavior: Behavior normal.           Assessment & Plan:  Morbid obesity ([place]) - Plan: Ambulatory referral to diabetic education  Pt is obese with hx of HTN, hyperlipidemia, and prediabetes. Here to discuss weight loss options. Encouraged continued efforts at healthy diet and exercise. Recommend referral to registered dietician for nutritional counseling. Also recommended calorie counting through app such as MyFitnessPal which is free to use. Recommend f/u in about 3 months to see her progress as well as to f/u on her chronic health issues.   Lab work ordered to have done prior to f/u visit.   Refill given on xanax per pt request, rare use. PDMP database checked.   Dr. Sallee Lange was consulted on this case and is in agreement with the above treatment plan.

## 2018-09-29 ENCOUNTER — Encounter: Payer: Self-pay | Admitting: Family Medicine

## 2018-10-07 ENCOUNTER — Telehealth: Payer: Self-pay | Admitting: Family

## 2018-10-07 ENCOUNTER — Encounter: Payer: Self-pay | Admitting: Family Medicine

## 2018-10-07 DIAGNOSIS — J069 Acute upper respiratory infection, unspecified: Secondary | ICD-10-CM

## 2018-10-07 MED ORDER — BENZONATATE 100 MG PO CAPS: 100.0000 mg | ORAL_CAPSULE | Freq: Three times a day (TID) | ORAL | 0 refills | Status: DC | PRN

## 2018-10-07 MED ORDER — PREDNISONE 5 MG PO TABS: 5.0000 mg | ORAL_TABLET | ORAL | 0 refills | Status: DC

## 2018-10-07 NOTE — Progress Notes (Signed)
Greater than 5 minutes, yet less than 10 minutes of time have been spent researching, coordinating, and implementing care for this patient today.  Thank you for the details you included in the comment boxes. Those details are very helpful in determining the best course of treatment for you and help [place] to provide the best care.  Given the short length of time, we must still treat this as a virus.Typically we do not give a steroid dose pack, but given  Your ear pain and inflammation, I will send one for you below.   We are sorry that you are not feeling well.  Here is how we plan to help!  Based on your presentation I believe you most likely have A cough due to a virus.  This is called viral bronchitis and is best treated by rest, plenty of fluids and control of the cough.  You Mas use Ibuprofen or Tylenol as directed to help your symptoms.     In addition you Montesdeoca use A non-prescription cough medication called Mucinex DM: take 2 tablets every 12 hours. and A prescription cough medication called Tessalon Perles 100mg . You Biggins take 1-2 capsules every 8 hours as needed for your cough.  Prednisone 5 mg daily for 6 days (see taper instructions below)  Directions for 6 day taper: Day 1: 2 tablets before breakfast, 1 after both lunch & dinner and 2 at bedtime Day 2: 1 tab before breakfast, 1 after both lunch & dinner and 2 at bedtime Day 3: 1 tab at each meal & 1 at bedtime Day 4: 1 tab at breakfast, 1 at lunch, 1 at bedtime Day 5: 1 tab at breakfast & 1 tab at bedtime Day 6: 1 tab at breakfast   From your responses in the eVisit questionnaire you describe inflammation in the upper respiratory tract which is causing a significant cough.  This is commonly called Bronchitis and has four common causes:    Allergies  Viral Infections  Acid Reflux  Bacterial Infection Allergies, viruses and acid reflux are treated by controlling symptoms or eliminating the cause. An example might be a cough caused  by taking certain blood pressure medications. You stop the cough by changing the medication. Another example might be a cough caused by acid reflux. Controlling the reflux helps control the cough.  USE OF BRONCHODILATOR ("RESCUE") INHALERS: There is a risk from using your bronchodilator too frequently.  The risk is that over-reliance on a medication which only relaxes the muscles surrounding the breathing tubes can reduce the effectiveness of medications prescribed to reduce swelling and congestion of the tubes themselves.  Although you feel brief relief from the bronchodilator inhaler, your asthma Tes[place] actually be worsening with the tubes becoming more swollen and filled with mucus.  This can delay other crucial treatments, such as oral steroid medications. If you need to use a bronchodilator inhaler daily, several times per day, you should discuss this with your provider.  There are probably better treatments that could be used to keep your asthma under control.     HOME CARE . Only take medications as instructed by your medical team. . Complete the entire course of an antibiotic. . Drink plenty of fluids and get plenty of rest. . Avoid close contacts especially the very young and the elderly . Cover your mouth if you cough or cough into your sleeve. . Always remember to wash your hands . A steam or ultrasonic humidifier can help congestion.   GET HELP RIGHT  AWAY IF: . You develop worsening fever. . You become short of breath . You cough up blood. . Your symptoms persist after you have completed your treatment plan MAKE SURE YOU   Understand these instructions.  Will watch your condition.  Will get help right away if you are not doing well or get worse.  Your e-visit answers were reviewed by a board certified advanced clinical practitioner to complete your personal care plan.  Depending on the condition, your plan could have included both over the counter or prescription medications. If  there is a problem please reply  once you have received a response from your provider. Your safety is important to [place].  If you have drug allergies check your prescription carefully.    You can use MyChart to ask questions about today's visit, request a non-urgent call back, or ask for a work or school excuse for 24 hours related to this e-Visit. If it has been greater than 24 hours you will need to follow up with your provider, or enter a new e-Visit to address those concerns. You will get an e-mail in the next two days asking about your experience.  I hope that your e-visit has been valuable and will speed your recovery. Thank you for using e-visits.

## 2018-10-08 ENCOUNTER — Ambulatory Visit (INDEPENDENT_AMBULATORY_CARE_PROVIDER_SITE_OTHER): Payer: Self-pay | Admitting: Nurse Practitioner

## 2018-10-08 VITALS — BP 102/70 | HR 82 | Temp 98.6°F | Resp 14 | Wt 217.8 lb

## 2018-10-08 DIAGNOSIS — J309 Allergic rhinitis, unspecified: Secondary | ICD-10-CM

## 2018-10-08 DIAGNOSIS — J029 Acute pharyngitis, unspecified: Secondary | ICD-10-CM

## 2018-10-08 DIAGNOSIS — H6980 Other specified disorders of Eustachian tube, unspecified ear: Secondary | ICD-10-CM

## 2018-10-08 MED ORDER — CETIRIZINE HCL 10 MG PO TABS: 10.0000 mg | ORAL_TABLET | Freq: Every day | ORAL | 0 refills | Status: DC

## 2018-10-08 MED ORDER — LIDOCAINE VISCOUS HCL 2 % MT SOLN: 5.0000 mL | Freq: Four times a day (QID) | OROMUCOSAL | 0 refills | Status: AC | PRN

## 2018-10-08 NOTE — Progress Notes (Signed)
MRN: [id number] DOB: 1971/06/28  Subjective:   Jessica [name] is a 48 y.o. female presenting for chief complaint of sore throat and right ear pain (x4 days ( tylenol, throat spray))   Reports 4 day history of ear fullness and sore throat, right sinus pressure. Has tried Chloraseptic and Tylenol for relief. Denies fever, sinus headache, sinus congestion , rhinorrhea, itchy watery eyes, red eyes, ear drainage, difficulty swallowing, inability to swallow, dry cough, productive cough, wheezing, shortness of breath and chest tightness, chills, fatigue, malaise, decreased appetite, nausea, vomiting, abdominal pain and diarrhea. Has not had sick contact that she is aware of but does work in a physical office.  Patient has had flu shot this season. Denies smoking, alcohol. Denies recent travel.  The patient was last diagnosed with a bacterial rhinosinusitis back in December, 2019.  At that time patient did have middle ear fluid in the right ear, and a suggestion to follow-up with ENT was made.  Patient informs she did not follow-up with ENT as she begins to feel better, along with other things coming up.  Patient also was diagnosed with a right otitis media in November, 2019.  Patient states she does have a history of recurrent ear problems, and feels she most likely will be following up with ENT.  The patient completed an ED visit on 3/13, and was prescribed prednisone and benzonatate.  Patient was unsure if she should take the medicine based on her symptoms, and she is also never had prednisone before.  Patient wanted to follow-up to have a face-to-face visit before picking up this medication.  Denies any other aggravating or relieving factors, no other questions or concerns.  Review of Systems  Constitutional: Negative for chills, fever and malaise/fatigue.  HENT: Positive for ear pain (right), sinus pain (worse on right side) and sore throat ( 2/2 PND , worse on right side). Negative for congestion, ear  discharge and hearing loss.   Eyes: Negative.  Negative for discharge and redness.  Respiratory: Negative.  Negative for cough, shortness of breath and wheezing.   Cardiovascular: Negative.   Gastrointestinal: Negative.   Skin: Negative.   Neurological: Negative.  Negative for headaches.  Endo/Heme/Allergies: Positive for environmental allergies.    Azlee has a current medication list which includes the following prescription(s): atorvastatin, citalopram, hydrochlorothiazide, lisinopril, metformin, alprazolam, benzonatate, omeprazole, and prednisone. Also is allergic to penicillins and vioxx [rofecoxib].  Satrina  has a past medical history of Anxiety, Cystitis, chronic, Gross hematuria, High cholesterol, Hypertension, and Obesity. Also  has a past surgical history that includes Abdominal hysterectomy; Tonsillectomy; Adenoidectomy; and Carpal tunnel release.   Objective:   Vitals: BP 102/70   Pulse 82   Temp 98.6 F (37 C)   Resp 14   Wt 217 lb 12.8 oz (98.8 kg)   SpO2 98%   BMI 39.84 kg/m   Physical Exam Vitals signs reviewed.  Constitutional:      General: She is not in acute distress.    Appearance: Normal appearance.  HENT:     Head: Normocephalic.     Right Ear: Ear canal and external ear normal. No decreased hearing noted. A middle ear effusion is present.     Left Ear: Ear canal and external ear normal. No decreased hearing noted. A middle ear effusion is present.     Nose: Mucosal edema present. No congestion or rhinorrhea.     Right Turbinates: Enlarged and swollen.     Left Turbinates: Enlarged and swollen.  Right Sinus: Maxillary sinus tenderness present. No frontal sinus tenderness.     Left Sinus: Maxillary sinus tenderness (increased on right side) present. No frontal sinus tenderness.     Mouth/Throat:     Lips: Pink.     Mouth: Mucous membranes are moist.     Pharynx: Uvula midline. Posterior oropharyngeal erythema present. No pharyngeal swelling,  oropharyngeal exudate or uvula swelling.     Tonsils: No tonsillar exudate. Swelling: 0 on the right. 0 on the left.  Eyes:     Conjunctiva/sclera: Conjunctivae normal.     Pupils: Pupils are equal, round, and reactive to light.  Neck:     Musculoskeletal: Normal range of motion and neck supple.  Cardiovascular:     Rate and Rhythm: Normal rate and regular rhythm.     Pulses: Normal pulses.     Heart sounds: Normal heart sounds.  Pulmonary:     Effort: Pulmonary effort is normal. No respiratory distress.     Breath sounds: Normal breath sounds. No stridor. No wheezing, rhonchi or rales.  Abdominal:     General: Bowel sounds are normal.     Palpations: Abdomen is soft.     Tenderness: There is no abdominal tenderness.  Lymphadenopathy:     Cervical: No cervical adenopathy.  Skin:    General: Skin is warm and dry.     Capillary Refill: Capillary refill takes less than 2 seconds.  Neurological:     General: No focal deficit present.     Mental Status: She is alert and oriented to person, place, and time.     Cranial Nerves: No cranial nerve deficit.  Psychiatric:        Mood and Affect: Mood normal.        Behavior: Behavior normal.     Assessment and Plan :   Exam findings, diagnosis etiology and medication use and indications reviewed with patient. Follow- Up and discharge instructions provided. No emergent/urgent issues found on exam. The patient's findings are consistent with a continued eustachian tube dysfunction and allergic rhinitis.  The patient did not follow up with ENT, so I wonder if her seasonal allergies are making her develop worsening middle ear fluid.  The patient does not exhibit signs of sinusitis at this time to include nasal obstruction, facial congestion, fever, or headache.  Physical exam shows patient's symptoms are worse on the right, which are consistent with the right ear pain.  I have instructed the patient to begin the prednisone she was prescribed in her  e-visit, along with starting zyrtec.  I am also providing symptomatic treatment with lidocaine for the patient's throat pain.  The patient does not display drooling, hot potato voice or difficulty swallowing, her appetite has not been affected.  I feel her throat pain has been aggravated by PND. Patient will follow up with ENT for further evaluation.  The patient is well-appearing, is in no acute distress and her vital signs are stable.  Patient education was provided. Patient verbalized understanding of information provided and agrees with plan of care (POC), all questions answered. The patient is advised to call or return to clinic if condition does not see an improvement in symptoms, or to seek the care of the closest emergency department if condition worsens with the above plan.   1. Allergic rhinitis, unspecified seasonality, unspecified trigger  - cetirizine (ZYRTEC) 10 MG tablet; Take 1 tablet (10 mg total) by mouth daily for 30 days.  Dispense: 30 tablet; Refill: 0 -I am  going to prescribe Zyrtec.  Take as directed. -Take Tylenol for right ear pain or discomfort until you complete the prednisone.  Once completed, you Shiel take Ibuprofen for pain or discomfort. -Warm compresses to the affected ear for comfort. -Continue use of Chloraseptic throat spray to help with discomfort.  I also suggest warm saltwater gargles until symptoms improve. -[place] also use a teaspoon of honey to help with throat discomfort. -Follow up with ENT for ongoing ear concerns to include recurrent ear infections and fluid in the middle ear.  2. Dysfunction of Eustachian tube, unspecified laterality  - cetirizine (ZYRTEC) 10 MG tablet; Take 1 tablet (10 mg total) by mouth daily for 30 days.  Dispense: 30 tablet; Refill: 0 -I would like for you to pick up the prednisone that was previously as prescribed.  Take medication as directed. -I am going to prescribe Zyrtec.  Take as directed. -Take Tylenol for right ear pain or  discomfort until you complete the prednisone.  Once completed, you Cliff take Ibuprofen for pain or discomfort. -Warm compresses to the affected ear for comfort. -Continue use of Chloraseptic throat spray to help with discomfort.  I also suggest warm saltwater gargles until symptoms improve. -Aigner also use a teaspoon of honey to help with throat discomfort. -Follow up with ENT for ongoing ear concerns to include recurrent ear infections and fluid in the middle ear.  3. Sore throat  - lidocaine (XYLOCAINE) 2 % solution; Use as directed 5 mLs in the mouth or throat every 6 (six) hours as needed for up to 5 days.  Dispense: 100 mL; Refill: 0

## 2018-10-08 NOTE — Patient Instructions (Addendum)
Eustachian Tube Dysfunction -I would like for you to pick up the prednisone that was previously as prescribed.  Take medication as directed. -I am going to prescribe Zyrtec.  Take as directed. -Take Tylenol for right ear pain or discomfort until you complete the prednisone.  Once completed, you Nibert take Ibuprofen for pain or discomfort. -Warm compresses to the affected ear for comfort. -Continue use of Chloraseptic throat spray to help with discomfort.  I also suggest warm saltwater gargles until symptoms improve. -Trnka also use a teaspoon of honey to help with throat discomfort. -Follow up with ENT for ongoing ear concerns to include recurrent ear infections and fluid in the middle ear.  Eustachian tube dysfunction refers to a condition in which a blockage develops in the narrow passage that connects the middle ear to the back of the nose (eustachian tube). The eustachian tube regulates air pressure in the middle ear by letting air move between the ear and nose. It also helps to drain fluid from the middle ear space. Eustachian tube dysfunction can affect one or both ears. When the eustachian tube does not function properly, air pressure, fluid, or both can build up in the middle ear. What are the causes? This condition occurs when the eustachian tube becomes blocked or cannot open normally. Common causes of this condition include:  Ear infections.  Colds and other infections that affect the nose, mouth, and throat (upper respiratory tract).  Allergies.  Irritation from cigarette smoke.  Irritation from stomach acid coming up into the esophagus (gastroesophageal reflux). The esophagus is the tube that carries food from the mouth to the stomach.  Sudden changes in air pressure, such as from descending in an airplane or scuba diving.  Abnormal growths in the nose or throat, such as: ? Growths that line the nose (nasal polyps). ? Abnormal growth of cells (tumors). ? Enlarged tissue at the  back of the throat (adenoids). What increases the risk? You are more likely to develop this condition if:  You smoke.  You are overweight.  You are a child who has: ? Certain birth defects of the mouth, such as cleft palate. ? Large tonsils or adenoids. What are the signs or symptoms? Common symptoms of this condition include:  A feeling of fullness in the ear.  Ear pain.  Clicking or popping noises in the ear.  Ringing in the ear.  Hearing loss.  Loss of balance.  Dizziness. Symptoms Everhart get worse when the air pressure around you changes, such as when you travel to an area of high elevation, fly on an airplane, or go scuba diving. How is this diagnosed? This condition Bunning be diagnosed based on:  Your symptoms.  A physical exam of your ears, nose, and throat.  Tests, such as those that measure: ? The movement of your eardrum (tympanogram). ? Your hearing (audiometry). How is this treated? Treatment depends on the cause and severity of your condition.  In mild cases, you Kolodziej relieve your symptoms by moving air into your ears. This is called "popping the ears."  In more severe cases, or if you have symptoms of fluid in your ears, treatment Kissner include: ? Medicines to relieve congestion (decongestants). ? Medicines that treat allergies (antihistamines). ? Nasal sprays or ear drops that contain medicines that reduce swelling (steroids). ? A procedure to drain the fluid in your eardrum (myringotomy). In this procedure, a small tube is placed in the eardrum to:  Drain the fluid.  Restore the air in  the middle ear space. ? A procedure to insert a balloon device through the nose to inflate the opening of the eustachian tube (balloon dilation). Follow these instructions at home: Lifestyle  Do not do any of the following until your health care provider approves: ? Travel to high altitudes. ? Fly in airplanes. ? Work in a Pension scheme manager or room. ? Scuba dive.  Do  not use any products that contain nicotine or tobacco, such as cigarettes and e-cigarettes. If you need help quitting, ask your health care provider.  Keep your ears dry. Wear fitted earplugs during showering and bathing. Dry your ears completely after. General instructions  Take over-the-counter and prescription medicines only as told by your health care provider.  Use techniques to help pop your ears as recommended by your health care provider. These Hollenbaugh include: ? Chewing gum. ? Yawning. ? Frequent, forceful swallowing. ? Closing your mouth, holding your nose closed, and gently blowing as if you are trying to blow air out of your nose.  Keep all follow-up visits as told by your health care provider. This is important. Contact a health care provider if:  Your symptoms do not go away after treatment.  Your symptoms come back after treatment.  You are unable to pop your ears.  You have: ? A fever. ? Pain in your ear. ? Pain in your head or neck. ? Fluid draining from your ear.  Your hearing suddenly changes.  You become very dizzy.  You lose your balance. Summary  Eustachian tube dysfunction refers to a condition in which a blockage develops in the eustachian tube.  It can be caused by ear infections, allergies, inhaled irritants, or abnormal growths in the nose or throat.  Symptoms include ear pain, hearing loss, or ringing in the ears.  Mild cases are treated with maneuvers to unblock the ears, such as yawning or ear popping.  Severe cases are treated with medicines. Surgery Reasoner also be done (rare). This information is not intended to replace advice given to you by your health care provider. Make sure you discuss any questions you have with your health care provider. Document Released: 08/09/2015 Document Revised: 11/02/2017 Document Reviewed: 11/02/2017 Elsevier Interactive Patient Education  2019 [employer]. Allergic Rhinitis, Adult Allergic rhinitis is an  allergic reaction that affects the mucous membrane inside the nose. It causes sneezing, a runny or stuffy nose, and the feeling of mucus going down the back of the throat (postnasal drip). Allergic rhinitis can be mild to severe. There are two types of allergic rhinitis:  Seasonal. This type is also called hay fever. It happens only during certain seasons.  Perennial. This type can happen at any time of the year. What are the causes? This condition happens when the body's defense system (immune system) responds to certain harmless substances called allergens as though they were germs.  Seasonal allergic rhinitis is triggered by pollen, which can come from grasses, trees, and weeds. Perennial allergic rhinitis Hannula be caused by:  House dust mites.  Pet dander.  Mold spores. What are the signs or symptoms? Symptoms of this condition include:  Sneezing.  Runny or stuffy nose (nasal congestion).  Postnasal drip.  Itchy nose.  Tearing of the eyes.  Trouble sleeping.  Daytime sleepiness. How is this diagnosed? This condition Gallacher be diagnosed based on:  Your medical history.  A physical exam.  Tests to check for related conditions, such as: ? Asthma. ? Pink eye. ? Ear infection. ? Upper respiratory  infection.  Tests to find out which allergens trigger your symptoms. These Clemson include skin or blood tests. How is this treated? There is no cure for this condition, but treatment can help control symptoms. Treatment Dell include:  Taking medicines that block allergy symptoms, such as antihistamines. Medicine Marlar be given as a shot, nasal spray, or pill.  Avoiding the allergen.  Desensitization. This treatment involves getting ongoing shots until your body becomes less sensitive to the allergen. This treatment Wade be done if other treatments do not help.  If taking medicine and avoiding the allergen does not work, new, stronger medicines Klimas be prescribed. Follow these  instructions at home:  Find out what you are allergic to. Common allergens include smoke, dust, and pollen.  Avoid the things you are allergic to. These are some things you can do to help avoid allergens: ? Replace carpet with wood, tile, or vinyl flooring. Carpet can trap dander and dust. ? Do not smoke. Do not allow smoking in your home. ? Change your heating and air conditioning filter at least once a month. ? During allergy season:  Keep windows closed as much as possible.  Plan outdoor activities when pollen counts are lowest. This is usually during the evening hours.  When coming indoors, change clothing and shower before sitting on furniture or bedding.  Take over-the-counter and prescription medicines only as told by your health care provider.  Keep all follow-up visits as told by your health care provider. This is important. Contact a health care provider if:  You have a fever.  You develop a persistent cough.  You make whistling sounds when you breathe (you wheeze).  Your symptoms interfere with your normal daily activities. Get help right away if:  You have shortness of breath. Summary  This condition can be managed by taking medicines as directed and avoiding allergens.  Contact your health care provider if you develop a persistent cough or fever.  During allergy season, keep windows closed as much as possible. This information is not intended to replace advice given to you by your health care provider. Make sure you discuss any questions you have with your health care provider. Document Released: 04/07/2001 Document Revised: 08/20/2016 Document Reviewed: 08/20/2016 Elsevier Interactive Patient Education  2019 [employer] Inc.  Sore Throat A sore throat is pain, burning, irritation, or scratchiness in the throat. When you have a sore throat, you Agresti feel pain or tenderness in your throat when you swallow or talk. Many things can cause a sore throat, including:   An infection.  Seasonal allergies.  Dryness in the air.  Irritants, such as smoke or pollution.  Radiation treatment to the area.  Gastroesophageal reflux disease (GERD).  A tumor. A sore throat is often the first sign of another sickness. It Wesolowski happen with other symptoms, such as coughing, sneezing, fever, and swollen neck glands. Most sore throats go away without medical treatment. Follow these instructions at home:      Take over-the-counter medicines only as told by your health care provider. ? If your child has a sore throat, do not give your child aspirin because of the association with Reye syndrome.  Drink enough fluids to keep your urine pale yellow.  Rest as needed.  To help with pain, try: ? Sipping warm liquids, such as broth, herbal tea, or warm water. ? Eating or drinking cold or frozen liquids, such as frozen ice pops. ? Gargling with a salt-water mixture 3-4 times a day or as  needed. To make a salt-water mixture, completely dissolve -1 tsp (3-6 g) of salt in 1 cup (237 mL) of warm water. ? Sucking on hard candy or throat lozenges. ? Putting a cool-mist humidifier in your bedroom at night to moisten the air. ? Sitting in the bathroom with the door closed for 5-10 minutes while you run hot water in the shower.  Do not use any products that contain nicotine or tobacco, such as cigarettes, e-cigarettes, and chewing tobacco. If you need help quitting, ask your health care provider.  Wash your hands well and often with soap and water. If soap and water are not available, use hand sanitizer. Contact a health care provider if:  You have a fever for more than 2-3 days.  You have symptoms that last (are persistent) for more than 2-3 days.  Your throat does not get better within 7 days.  You have a fever and your symptoms suddenly get worse.  Your child who is 3 months to 58 years old has a temperature of 102.78F (39C) or higher. Get help right away if:  You  have difficulty breathing.  You cannot swallow fluids, soft foods, or your saliva.  You have increased swelling in your throat or neck.  You have persistent nausea and vomiting. Summary  A sore throat is pain, burning, irritation, or scratchiness in the throat. Many things can cause a sore throat.  Take over-the-counter medicines only as told by your health care provider. Do not give your child aspirin.  Drink plenty of fluids, and rest as needed.  Contact a health care provider if your symptoms worsen or your sore throat does not get better within 7 days. This information is not intended to replace advice given to you by your health care provider. Make sure you discuss any questions you have with your health care provider. Document Released: 08/20/2004 Document Revised: 12/13/2017 Document Reviewed: 12/13/2017 Elsevier Interactive Patient Education  2019 Else[employer] Inc.  Postnasal Drip Postnasal drip is the feeling of mucus going down the back of your throat. Mucus is a slimy substance that moistens and cleans your nose and throat, as well as the air pockets in face bones near your forehead and cheeks (sinuses). Small amounts of mucus pass from your nose and sinuses down the back of your throat all the time. This is normal. When you produce too much mucus or the mucus gets too thick, you can feel it. Some common causes of postnasal drip include:  Having more mucus because of: ? A cold or the flu. ? Allergies. ? Cold air. ? Certain medicines.  Having more mucus that is thicker because of: ? A sinus or nasal infection. ? Dry air. ? A food allergy. Follow these instructions at home: Relieving discomfort   Gargle with a salt-water mixture 3-4 times a day or as needed. To make a salt-water mixture, completely dissolve -1 tsp of salt in 1 cup of warm water.  If the air in your home is dry, use a humidifier to add moisture to the air.  Use a saline spray or container (neti pot) to  flush out the nose (nasal irrigation). These methods can help clear away mucus and keep the nasal passages moist. General instructions  Take over-the-counter and prescription medicines only as told by your health care provider.  Follow instructions from your health care provider about eating or drinking restrictions. You Ionescu need to avoid caffeine.  Avoid things that you know you are allergic to (allergens), like  dust, mold, pollen, pets, or certain foods.  Drink enough fluid to keep your urine pale yellow.  Keep all follow-up visits as told by your health care provider. This is important. Contact a health care provider if:  You have a fever.  You have a sore throat.  You have difficulty swallowing.  You have headache.  You have sinus pain.  You have a cough that does not go away.  The mucus from your nose becomes thick and is green or yellow in color.  You have cold or flu symptoms that last more than 10 days. Summary  Postnasal drip is the feeling of mucus going down the back of your throat.  If your health care provider approves, use nasal irrigation or a nasal spray 2?4 times a day.  Avoid things that you know you are allergic to (allergens), like dust, mold, pollen, pets, or certain foods. This information is not intended to replace advice given to you by your health care provider. Make sure you discuss any questions you have with your health care provider. Document Released: 10/26/2016 Document Revised: 10/26/2016 Document Reviewed: 10/26/2016 Elsevier Interactive Patient Education  2019 [employer].

## 2018-10-10 ENCOUNTER — Telehealth: Payer: Self-pay

## 2018-10-10 NOTE — Telephone Encounter (Signed)
Patient states he is feeling good. 

## 2018-10-13 ENCOUNTER — Ambulatory Visit (INDEPENDENT_AMBULATORY_CARE_PROVIDER_SITE_OTHER): Payer: No Typology Code available for payment source | Admitting: Otolaryngology

## 2018-10-13 DIAGNOSIS — H6983 Other specified disorders of Eustachian tube, bilateral: Secondary | ICD-10-CM

## 2018-10-13 DIAGNOSIS — H7201 Central perforation of tympanic membrane, right ear: Secondary | ICD-10-CM

## 2018-10-17 ENCOUNTER — Encounter: Payer: Self-pay | Admitting: Family Medicine

## 2018-10-17 ENCOUNTER — Telehealth: Payer: Self-pay | Admitting: Family Medicine

## 2018-10-17 MED ORDER — HYDROCODONE-HOMATROPINE 5-1.5 MG/5ML PO SYRP: ORAL_SOLUTION | ORAL | 0 refills | Status: DC

## 2018-10-17 MED ORDER — CEFPROZIL 500 MG PO TABS: ORAL_TABLET | ORAL | 0 refills | Status: DC

## 2018-10-17 MED ORDER — CITALOPRAM HYDROBROMIDE 20 MG PO TABS: 20.0000 mg | ORAL_TABLET | Freq: Every day | ORAL | 1 refills | Status: DC

## 2018-10-17 MED ORDER — LISINOPRIL 20 MG PO TABS: 20.0000 mg | ORAL_TABLET | Freq: Every day | ORAL | 1 refills | Status: DC

## 2018-10-17 NOTE — Telephone Encounter (Signed)
cefzil 500 bid ten d 

## 2018-10-17 NOTE — Telephone Encounter (Signed)
Patient was seen at [facility] 3/19 and told she had hole in right-eardrum. She has a cough,congestion,sorethroat,backpain ,no fever, tired.She has been on prednisone 5 mg,flonase. [phone]

## 2018-10-17 NOTE — Telephone Encounter (Signed)
Medication sent to pharmacy. Pt verbalized understanding. Pt wanted to know if provider would sent in Hycodan cough syrup also. Please advise. Thank you

## 2018-10-17 NOTE — Telephone Encounter (Signed)
3 oz one tspn q six hrs prn cough

## 2018-10-17 NOTE — Telephone Encounter (Signed)
Pending please send in. She also wanted Lisinopril and celexa called in to those are pending also.

## 2018-10-17 NOTE — Telephone Encounter (Signed)
Ear pain, throat pain, congestion, in the morning productive cough with phelgm, no drainage in nose. uc said URI last week. Has been on prednisone, flonase, throat spray. Treatments have not been helpin. Feeling blah for over a week. No shortness of breath. Pt would like to know if something can be called in. If she needs to come in she can but she is working over at [employer] office. Please advise. Thank you

## 2018-10-18 NOTE — Telephone Encounter (Signed)
Pt notified that meds were sent in yesterday and she states she was told by nurse yesterday to check with pharm and she got meds yesterday. She has taken 3 doses of antibiotics and states she does not feel any better but not worse either. No fever, no sob. Advised pt to give antibiotics a few more days and call back end of week if not better or call sooner if worse. Pt verbalized understanding.

## 2018-10-18 NOTE — Telephone Encounter (Signed)
correct 

## 2018-10-27 MED FILL — ATORVASTATIN 10 MG TABLET: 10 | 90 days supply | Qty: 90 | Fill #0

## 2018-10-27 MED FILL — HYDROCHLOROTHIAZIDE 25 MG T: 25 | 90 days supply | Qty: 90 | Fill #0

## 2018-10-29 MED FILL — CITALOPRAM HBR 20 MG TABLET: 20 | 90 days supply | Qty: 90 | Fill #0

## 2018-10-29 MED FILL — LISINOPRIL 20 MG TABLET: 20 | 90 days supply | Qty: 90 | Fill #0

## 2018-10-31 ENCOUNTER — Other Ambulatory Visit: Payer: Self-pay

## 2018-10-31 MED ORDER — LISINOPRIL 20 MG PO TABS: 20.0000 mg | ORAL_TABLET | Freq: Every day | ORAL | 1 refills | Status: DC

## 2018-10-31 MED ORDER — CITALOPRAM HYDROBROMIDE 20 MG PO TABS: 20.0000 mg | ORAL_TABLET | Freq: Every day | ORAL | 1 refills | Status: DC

## 2018-11-28 ENCOUNTER — Ambulatory Visit (INDEPENDENT_AMBULATORY_CARE_PROVIDER_SITE_OTHER): Payer: No Typology Code available for payment source | Admitting: Otolaryngology

## 2018-11-28 DIAGNOSIS — J310 Chronic rhinitis: Secondary | ICD-10-CM | POA: Diagnosis not present

## 2018-11-28 DIAGNOSIS — H6983 Other specified disorders of Eustachian tube, bilateral: Secondary | ICD-10-CM | POA: Diagnosis not present

## 2018-11-28 DIAGNOSIS — H7201 Central perforation of tympanic membrane, right ear: Secondary | ICD-10-CM

## 2018-12-12 ENCOUNTER — Encounter: Payer: Self-pay | Admitting: Family Medicine

## 2018-12-13 ENCOUNTER — Other Ambulatory Visit: Payer: Self-pay

## 2018-12-13 MED ORDER — ALPRAZOLAM 0.5 MG PO TABS: 0.5000 mg | ORAL_TABLET | Freq: Every day | ORAL | 1 refills | Status: DC | PRN

## 2018-12-22 ENCOUNTER — Ambulatory Visit (INDEPENDENT_AMBULATORY_CARE_PROVIDER_SITE_OTHER): Payer: No Typology Code available for payment source | Admitting: Family Medicine

## 2018-12-22 ENCOUNTER — Other Ambulatory Visit: Payer: Self-pay

## 2018-12-22 DIAGNOSIS — I10 Essential (primary) hypertension: Secondary | ICD-10-CM

## 2018-12-22 DIAGNOSIS — F419 Anxiety disorder, unspecified: Secondary | ICD-10-CM

## 2018-12-22 DIAGNOSIS — R7303 Prediabetes: Secondary | ICD-10-CM | POA: Diagnosis not present

## 2018-12-22 MED ORDER — VERAPAMIL HCL ER 240 MG PO TBCR: EXTENDED_RELEASE_TABLET | ORAL | 5 refills | Status: DC

## 2018-12-22 MED ORDER — ALPRAZOLAM 0.5 MG PO TABS: 0.5000 mg | ORAL_TABLET | Freq: Every day | ORAL | 2 refills | Status: DC | PRN

## 2018-12-22 NOTE — Progress Notes (Signed)
   Subjective:    Patient ID: Julyanna M Clippinger, female    DOB: 18-May-1971, 48 y.o.   MRN: [id number] Audio  Patient calls with numerous concerns HPI Pt would like to switch HCTZ. Pt states she is not able to be out in sun with the HCTZ. Pt [place] place [place] the [place] and when she went a couple days ago, she burnt easy. Pt was using sunscreen.  Patient is outdoors a lot.  Hydrochlorothiazide causing some sensitization.  Has been on other blood pressure meds in the past.  Would like to make a change.  Notes no major trouble with swelling in her ankles or feet.  Virtual Visit via Video Note  I connected with Margerite M Schellhorn on 12/22/18 at 10:30 AM EDT by a video enabled telemedicine application and verified that I am speaking with the correct person using two identifiers.  Location: Patient: home Provider: office   I discussed the limitations of evaluation and management by telemedicine and the availability of in person appointments. The patient expressed understanding and agreed to proceed.  History of Present Illness:    Observations/Objective:   Assessment and Plan:   Follow Up Instructions:    I discussed the assessment and treatment plan with the patient. The patient was provided an opportunity to ask questions and all were answered. The patient agreed with the plan and demonstrated an understanding of the instructions.   The patient was advised to call back or seek an in-person evaluation if the symptoms worsen or if the condition fails to improve as anticipated.  I provided 25 minutes of non-face-to-face time during this encounter.   Ongoing anxiety.  Rare but substantial.  When it strikes needs Xanax as needed.  Has near panic attacks at times with certain challenges.  [name], LPN    Review of Systems No headache, no major weight loss or weight gain, no chest pain no back pain abdominal pain no change in bowel habits complete ROS otherwise negative     Objective:   Physical Exam  Virtual visit      Assessment & Plan:  Impression sun sensitization with hydrochlorothiazide discussed avoid in the future  2.  Hypertension.  Likely will be suboptimum stopping the HCTZ.  Add calcium channel blocker verapamil 240 SR rationale discussed  3.  Anxiety with an element of near panic attacks from put in stressful situations.  States definitely needs Xanax on hand will refill  Diet exercise discussed meds refilled.  Follow-up 6 months

## 2019-01-16 ENCOUNTER — Telehealth: Payer: No Typology Code available for payment source | Admitting: Family

## 2019-01-16 ENCOUNTER — Encounter: Payer: Self-pay | Admitting: Family Medicine

## 2019-01-16 DIAGNOSIS — L559 Sunburn, unspecified: Secondary | ICD-10-CM | POA: Diagnosis not present

## 2019-01-16 MED ORDER — TRIAMCINOLONE ACETONIDE 0.025 % EX OINT: 1.0000 "application " | TOPICAL_OINTMENT | Freq: Two times a day (BID) | CUTANEOUS | 0 refills | Status: DC

## 2019-01-16 NOTE — Progress Notes (Signed)
We are sorry that you are not feeling well.  Here is how we plan to help!  Based on what you have shared with me, I'd like to share with you a treatment plan for sunburn.   Most sunburn is a first degree burn that turns the skin pink or red.  It can be painful to touch.  If you stayed in the sun for a prolonged period this might have progressed to a second degree burn with blistering!  Usually the pain and swelling starts after about 4 hours, peaks at 24 hours and begins to improve after 48 hours or about 2 days.  REMEMBER prolonged exposure to the sun increases your risk of skin cancer so use sunscreen before you go outside!  We will give you more information about sunscreen use later in your care plan.  Your sunburn can be managed by self-care at home.  Please use the following care guide to manage your sunburn.  If you symptoms worsen, you have other questions or concerns, or you develop any of the warnings signs listed in your care plan you will need to seek a face to face visit with a provider without waiting!  I have sent in a prescription of Kenalog cream 0.025% that you can apply twice a day.  Approximately 5 minutes was spent documenting and reviewing patient's chart.    Home Care Advice for Treating Mild Sunburn:  1. Take Ibuprofen (Advil, Motrin) for pain relief as soon as possible.  The adult dosage is up to 600 mg every 6 hours.  Starting within 6 hours of sun exposure Reels greatly reduce your discomfort.  If you cannot take Ibuprofen you [place] use Acetaminophen instead.  Do not take Ibuprofen if you have stomach problems, kidney disease or are pregnant.  Do not take Ibuprofen if you have been told by your doctor or pharmacist to avoid this class of drugs.  Do not take Acetaminophen if you have liver disease.  Read the package warnings on any medication that you take!  2.  Use a steroid cream on the affected skin.  If you apply an over the counter steroid       cream as soon  as possible and repeat it three times a day it [place] reduce the pain and      and swelling.  Until you get the steroid cream you Dillon start with a moistening cream      cream or aloe gel.  3. For second or third degree sunburn with painful blistering, you can use an over the         counter product Burn Jel Plus Pain Relieving Gel. Apply in a thick even layer over the     affected area not more than 3 to 4 times daily If you need to cover the area to protect      it from friction of clothing, you can use Moist Burn Pads such as Hydrogel Burn Pads       which are available over the counter.  4.  Apply cool compresses to the burned areas several times a day.  5.  Avoid soap on the sunburned areas.  6.  Drink plenty of water.  It is easy to get dehydrated from prolong time in the sun      Outdoors.  7.  For any broken blisters:  Trim off the dead skin with fine scissors.  It is wise to clean the scissor with alcohol before use.  Apply antibiotic  ointments to the blister.  Apply twice a day for three days.  There are triple antibiotic ointments with topical pain relievers available at stores.  Caution:leave intact blisters alone.  They are protecting the skin and will allow it to heal.  8.  Taking Vitamin C orally [place] reduce sun damage to your skin.  Follow the      Instructions on the bottle.  The recommended adult dosage is 2 grams.  What to Expect:  1. Pain usually stops after 2 or 3 days. 2. Skin flaking and peeling usually occurs for 3-7 days after a sunburn.  Call your provider if:  1. You feel very weak or have difficulty standing. 2. Blister develops on your face. 3. You become sensitive to light because of eye pain. 4. Your skin looks infected (red streaks, puss or worsening tenderness after 48 hours. 5. You feel you should be seen.  Preventing Sunburns:  1. Apply 20-30 SPF sunscreen to your skin before going into the sun. 2. Reapply every 2-4 hours or after sweating or  swimming. 3. Sunscreens protect from sunburns but do not completely prevent skin damage.  Nancy Fetter exposure still increases your risk of premature aging and skin cancers.  Your e-visit answers were reviewed by a board certified advanced clinical practitioner to complete your personal care plan.  Depending on the condition, your plan could have included both over the counter or prescription medications.  If there is a problem please reply  once you have received a response from your provider.  Your safety is important to [place].  If you have drug allergies check your prescription carefully.    You can use MyChart to ask questions about today's visit, request a non-urgent call back, or ask for a work or school excuse for 24 hours related to this e-Visit. If it has been greater than 24 hours you will need to follow up with your provider, or enter a new e-Visit to address those concerns.  You will get an e-mail in the next two days asking about your experience.  I hope that your e-visit has been valuable and will speed your recovery. Thank you for using e-visits.

## 2019-01-17 NOTE — Telephone Encounter (Signed)
Autumn, was there another message this morning? Thanks.

## 2019-02-16 ENCOUNTER — Telehealth: Payer: Self-pay | Admitting: Family Medicine

## 2019-02-16 MED ORDER — CITALOPRAM HYDROBROMIDE 20 MG PO TABS: 20.0000 mg | ORAL_TABLET | Freq: Every day | ORAL | 1 refills | Status: DC

## 2019-02-16 MED FILL — LISINOPRIL 20 MG TABLET: 20 | 90 days supply | Qty: 90 | Fill #0

## 2019-02-16 MED FILL — CITALOPRAM HBR 20 MG TABLET: 20 | 90 days supply | Qty: 90 | Fill #0

## 2019-02-16 NOTE — Telephone Encounter (Signed)
Pharmacy requesting refill on Citalopram 20 mg tablet. Take one tablet by mouth once a day. Pt last seen 12/22/2018 for anxiety. Please advise. Thank you

## 2019-02-16 NOTE — Telephone Encounter (Signed)
Ok six mo 

## 2019-02-16 NOTE — Addendum Note (Signed)
Addended by: [name] on: 02/16/2019 03:08 PM   Modules accepted: Orders

## 2019-02-16 NOTE — Telephone Encounter (Signed)
Refills sent to pharmacy. 

## 2019-03-06 ENCOUNTER — Ambulatory Visit (INDEPENDENT_AMBULATORY_CARE_PROVIDER_SITE_OTHER): Payer: No Typology Code available for payment source | Admitting: Family Medicine

## 2019-03-06 ENCOUNTER — Encounter: Payer: Self-pay | Admitting: Family Medicine

## 2019-03-06 ENCOUNTER — Other Ambulatory Visit: Payer: Self-pay

## 2019-03-06 VITALS — BP 133/90 | HR 76 | Temp 98.3°F | Ht 61.0 in | Wt 218.6 lb

## 2019-03-06 DIAGNOSIS — R7303 Prediabetes: Secondary | ICD-10-CM

## 2019-03-06 DIAGNOSIS — Z808 Family history of malignant neoplasm of other organs or systems: Secondary | ICD-10-CM

## 2019-03-06 DIAGNOSIS — M545 Low back pain, unspecified: Secondary | ICD-10-CM | POA: Insufficient documentation

## 2019-03-06 DIAGNOSIS — Z0000 Encounter for general adult medical examination without abnormal findings: Secondary | ICD-10-CM | POA: Insufficient documentation

## 2019-03-06 DIAGNOSIS — K219 Gastro-esophageal reflux disease without esophagitis: Secondary | ICD-10-CM | POA: Diagnosis not present

## 2019-03-06 DIAGNOSIS — E7800 Pure hypercholesterolemia, unspecified: Secondary | ICD-10-CM | POA: Insufficient documentation

## 2019-03-06 DIAGNOSIS — I10 Essential (primary) hypertension: Secondary | ICD-10-CM

## 2019-03-06 DIAGNOSIS — R5383 Other fatigue: Secondary | ICD-10-CM | POA: Insufficient documentation

## 2019-03-06 DIAGNOSIS — F419 Anxiety disorder, unspecified: Secondary | ICD-10-CM

## 2019-03-06 DIAGNOSIS — Z129 Encounter for screening for malignant neoplasm, site unspecified: Secondary | ICD-10-CM

## 2019-03-06 MED ORDER — CYCLOBENZAPRINE HCL 10 MG PO TABS: ORAL_TABLET | ORAL | 2 refills | Status: DC

## 2019-03-06 NOTE — Progress Notes (Addendum)
New Patient Office Visit  Subjective:  Patient ID: Jessica [name], female    DOB: 09-14-1970  Age: 48 y.o. MRN: [id number]  CC:  Chief Complaint  Patient presents with  . New Patient (Initial Visit)    HPI Jessica [name] presents for  a history of hysterectomy due to endometriosis  still has 1 ovary not sure which side, having right sided lower back pain possible from the one ovary left . Complaining of being tired a lot  Zyrtec for allergies-prn-no shots  Hyperlipidemia-lipitor-daily-last lft 4/19-normal  Pre-DM-metformin daily  GERD-prilosec prn  HTN-lisinopril-daily-ECG 5/19-ED Deneise [place] [place]-syncopal episode  Cystitis-in childhood-uti in the past-urologist in the past-cystoscope-normal  Constipation-stool softener-no colon cancer-hemorrhoids noted after delivery of 10lbs, 7oz    Past Medical History:  Diagnosis Date  . Anxiety   . Cystitis, chronic   . Gross hematuria   . High cholesterol   . Hypertension   . Obesity     Past Surgical History:  Procedure Laterality Date  . ABDOMINAL HYSTERECTOMY    . ADENOIDECTOMY    . CARPAL TUNNEL RELEASE    . TONSILLECTOMY      Family History  Problem Relation Age of Onset  . Hyperlipidemia Mother   . Diabetes Father   . Hypertension Father   . Stroke Father 8  . Cancer Paternal Grandfather        skin  . Hypertension Paternal Grandmother   . Diabetes Paternal Grandmother   . Cancer Maternal Grandmother        ovarian    Social History   Socioeconomic History  . Marital status: Married    Spouse name: Not on file  . Number of children: Not on file  . Years of education: Not on file  . Highest education level: Not on file  Occupational History  . Not on file  Social Needs  . Financial resource strain: Not on file  . Food insecurity    Worry: Not on file    Inability: Not on file  . Transportation needs    Medical: Not on file    Non-medical: Not on file  Tobacco Use  . Smoking status: Former Smoker   Types: Cigarettes    Quit date: 06/06/2016    Years since quitting: 2.7  . Smokeless tobacco: Never Used  Substance and Sexual Activity  . Alcohol use: No  . Drug use: No  . Sexual activity: Yes    Birth control/protection: None  Lifestyle  . Physical activity    Days per week: Not on file    Minutes per session: Not on file  . Stress: Not on file  Relationships  . Social Herbalist on phone: Not on file    Gets together: Not on file    Attends religious service: Not on file    Active member of club or organization: Not on file    Attends meetings of clubs or organizations: Not on file    Relationship status: Not on file  . Intimate partner violence    Fear of current or ex partner: Not on file    Emotionally abused: Not on file    Physically abused: Not on file    Forced sexual activity: Not on file  Other Topics Concern  . Not on file  Social History Narrative  . Not on file    ROS Review of Systems  Constitutional: Positive for fatigue.  HENT: Negative for ear pain.   Respiratory: Negative for  cough.   Cardiovascular: Negative for chest pain.  Gastrointestinal: Negative for abdominal pain and blood in stool.  Genitourinary: Positive for flank pain. Negative for difficulty urinating, dyspareunia, dysuria, hematuria, pelvic pain, urgency and vaginal pain.  Musculoskeletal: Negative for arthralgias.  Skin: Negative for color change.  Allergic/Immunologic: Positive for environmental allergies.  Neurological: Negative for dizziness and headaches.  Psychiatric/Behavioral: Negative for sleep disturbance and suicidal ideas. The patient is nervous/anxious.    Glasses-since middle school Endometriosis-causing abdominal pain-hysterectomy -1 ovary remains Hole in the ear drum Counseling when son having behavioral issues Objective:   Today's Vitals: BP 133/90 (BP Location: Left Arm, Patient Position: Sitting, Cuff Size: Normal)   Pulse 76   Temp 98.3 F (36.8  C) (Oral)   Ht 5\' 1"  (1.549 m)   Wt 218 lb 9.6 oz (99.2 kg)   SpO2 96%   BMI 41.30 kg/m   Physical Exam Constitutional:      General: She is not in acute distress.    Appearance: Normal appearance. She is not ill-appearing or toxic-appearing.  HENT:     Head: Normocephalic and atraumatic.     Left Ear: Tympanic membrane, ear canal and external ear normal.     Nose: Nose normal.     Mouth/Throat:     Mouth: Mucous membranes are moist.     Pharynx: Oropharynx is clear.  Neck:     Musculoskeletal: Normal range of motion.  Cardiovascular:     Rate and Rhythm: Normal rate and regular rhythm.     Pulses: Normal pulses.     Heart sounds: Normal heart sounds.  Pulmonary:     Effort: Pulmonary effort is normal.     Breath sounds: Normal breath sounds.  Abdominal:     General: Abdomen is flat. Bowel sounds are normal.     Palpations: Abdomen is soft.  Musculoskeletal: Normal range of motion.        General: Tenderness present.  Neurological:     Mental Status: She is alert and oriented to person, place, and time.  Psychiatric:        Mood and Affect: Mood normal.   UNABLE TO VISUALIZE RIGHT EAR TM AS NO TIPS FOR OTOSCOPE Right SI joint pain to palpation   Outpatient Encounter Medications as of 03/06/2019  Medication Sig  . ALPRAZolam (XANAX) 0.5 MG tablet Take 1 tablet (0.5 mg total) by mouth daily as needed for anxiety.  Marland Kitchen atorvastatin (LIPITOR) 10 MG tablet Take 1 tablet (10 mg total) by mouth daily.  . cetirizine (ZYRTEC) 10 MG tablet Take 1 tablet (10 mg total) by mouth daily for 30 days.  . citalopram (CELEXA) 20 MG tablet Take 1 tablet (20 mg total) by mouth daily.  Marland Kitchen lisinopril (PRINIVIL,ZESTRIL) 20 MG tablet Take 1 tablet (20 mg total) by mouth at bedtime.  . metFORMIN (GLUCOPHAGE) 500 MG tablet Take 1 tablet (500 mg total) by mouth 2 (two) times daily. (Patient taking differently: Take 500 mg by mouth once. )  . omeprazole (PRILOSEC) 20 MG capsule Take 1 capsule (20 mg  total) by mouth daily.  Marland Kitchen triamcinolone (KENALOG) 0.025 % ointment Apply 1 application topically 2 (two) times daily.  . [DISCONTINUED] verapamil (CALAN-SR) 240 MG CR tablet Take one tablet by mouth daily (Patient not taking: Reported on 03/06/2019)   No facility-administered encounter medications on file as of 03/06/2019.    1. Gastroesophageal reflux disease without esophagitis No recent scope - CBC - COMPLETE METABOLIC PANEL WITH GFR  2. Essential hypertension  ecg normal 5/19  3. Prediabetes Metformin-rx - COMPLETE METABOLIC PANEL WITH GFR - Hemoglobin A1c  4. Pure hypercholesterolemia - Lipid panel  5. Other fatigue - TSH  6. Family history of skin cancer - Ambulatory referral to Dermatology  7. Screening for cancer - MM Digital Screening; Future  8.anxiety- D/W pt-I do not recommend xanax-pt states she was given #15 and does not take more than 1/month. Will continue celexa-if pt using more Xanax-consider counseling and additional medications for anxiety.   9.Back pain-flexeril-fx-tenderness to palpation-likely musculoskeletal-if continued pain-f/u for additional evaluation  Wellness- labwork fasting Mammogram Derm referral -+FH skin cancer LISA Hannah Beat, MD

## 2019-03-06 NOTE — Patient Instructions (Addendum)
Sun screen for  outdoor activities labwork fasting Will set up mammogram and derm evaluation

## 2019-03-24 ENCOUNTER — Telehealth: Payer: Self-pay

## 2019-03-24 DIAGNOSIS — E7800 Pure hypercholesterolemia, unspecified: Secondary | ICD-10-CM

## 2019-03-24 MED ORDER — ATORVASTATIN CALCIUM 10 MG PO TABS: 10.0000 mg | ORAL_TABLET | Freq: Every day | ORAL | 1 refills | Status: DC

## 2019-03-24 MED FILL — ATORVASTATIN 10 MG TABLET: 10 | 90 days supply | Qty: 90 | Fill #0

## 2019-03-24 NOTE — Telephone Encounter (Signed)
Jessica [name], CMA  

## 2019-04-04 ENCOUNTER — Ambulatory Visit (INDEPENDENT_AMBULATORY_CARE_PROVIDER_SITE_OTHER): Payer: No Typology Code available for payment source

## 2019-04-04 ENCOUNTER — Other Ambulatory Visit: Payer: Self-pay

## 2019-04-04 VITALS — BP 136/95 | HR 79 | Temp 98.5°F | Ht 60.0 in | Wt 217.8 lb

## 2019-04-04 DIAGNOSIS — R350 Frequency of micturition: Secondary | ICD-10-CM | POA: Insufficient documentation

## 2019-04-04 DIAGNOSIS — R319 Hematuria, unspecified: Secondary | ICD-10-CM

## 2019-04-04 DIAGNOSIS — I10 Essential (primary) hypertension: Secondary | ICD-10-CM | POA: Diagnosis not present

## 2019-04-04 DIAGNOSIS — H6591 Unspecified nonsuppurative otitis media, right ear: Secondary | ICD-10-CM | POA: Diagnosis not present

## 2019-04-04 DIAGNOSIS — R7303 Prediabetes: Secondary | ICD-10-CM | POA: Diagnosis not present

## 2019-04-04 LAB — POCT URINALYSIS DIP (CLINITEK)
Bilirubin, UA: NEGATIVE
Glucose, UA: 100 mg/dL — AB
Ketones, POC UA: NEGATIVE mg/dL
Leukocytes, UA: NEGATIVE
Nitrite, UA: NEGATIVE
POC PROTEIN,UA: 30 — AB
Spec Grav, UA: 1.025 (ref 1.010–1.025)
Urobilinogen, UA: 1 E.U./dL
pH, UA: 5.5 (ref 5.0–8.0)

## 2019-04-04 LAB — POCT GLYCOSYLATED HEMOGLOBIN (HGB A1C): Hemoglobin A1C: 6.4 % — AB (ref 4.0–5.6)

## 2019-04-04 MED ORDER — METFORMIN HCL 500 MG PO TABS: 500.0000 mg | ORAL_TABLET | Freq: Two times a day (BID) | ORAL | 1 refills | Status: DC

## 2019-04-04 MED ORDER — CIPROFLOXACIN HCL 250 MG PO TABS: ORAL_TABLET | ORAL | 0 refills | Status: DC

## 2019-04-04 MED FILL — metFORMIN HCL 500 MG TABS: 500 | 90 days supply | Qty: 180 | Fill #0

## 2019-04-04 NOTE — Progress Notes (Signed)
Acute Office Visit  Subjective:    Patient ID: Jessica [name], female    DOB: 07-25-1971, 48 y.o.   MRN: [id number]  Chief Complaint  Patient presents with  . Urinary Tract Infection    HPI Patient is in today for [place] camping when onset of symptoms on Friday.  Pt states frequency and pressure noted. No back pain.  Pt noted increase in urine concentration.    Past Medical History:  Diagnosis Date  . Anxiety   . Cystitis, chronic   . Gross hematuria   . High cholesterol   . Hypertension   . Obesity     Past Surgical History:  Procedure Laterality Date  . ABDOMINAL HYSTERECTOMY    . ADENOIDECTOMY    . CARPAL TUNNEL RELEASE    . TONSILLECTOMY      Family History  Problem Relation Age of Onset  . Hyperlipidemia Mother   . Diabetes Father   . Hypertension Father   . Stroke Father 66  . Cancer Paternal Grandfather        skin  . Hypertension Paternal Grandmother   . Diabetes Paternal Grandmother   . Cancer Maternal Grandmother        ovarian    Social History   Socioeconomic History  . Marital status: Married    Spouse name: Not on file  . Number of children: Not on file  . Years of education: Not on file  . Highest education level: Not on file  Occupational History  . Not on file  Social Needs  . Financial resource strain: Not on file  . Food insecurity    Worry: Not on file    Inability: Not on file  . Transportation needs    Medical: Not on file    Non-medical: Not on file  Tobacco Use  . Smoking status: Former Smoker    Types: Cigarettes    Quit date: 06/06/2016    Years since quitting: 2.8  . Smokeless tobacco: Never Used  Substance and Sexual Activity  . Alcohol use: No  . Drug use: No  . Sexual activity: Yes    Birth control/protection: None  Lifestyle  . Physical activity    Days per week: Not on file    Minutes per session: Not on file  . Stress: Not on file  Relationships  . Social Herbalist on phone: Not on file     Gets together: Not on file    Attends religious service: Not on file    Active member of club or organization: Not on file    Attends meetings of clubs or organizations: Not on file    Relationship status: Not on file  . Intimate partner violence    Fear of current or ex partner: Not on file    Emotionally abused: Not on file    Physically abused: Not on file    Forced sexual activity: Not on file  Other Topics Concern  . Not on file  Social History Narrative  . Not on file    Outpatient Medications Prior to Visit  Medication Sig Dispense Refill  . ALPRAZolam (XANAX) 0.5 MG tablet Take 1 tablet (0.5 mg total) by mouth daily as needed for anxiety. 15 tablet 2  . atorvastatin (LIPITOR) 10 MG tablet Take 1 tablet (10 mg total) by mouth daily. 90 tablet 1  . cetirizine (ZYRTEC) 10 MG tablet Take 1 tablet (10 mg total) by mouth daily for 30 days. [place]  tablet 0  . citalopram (CELEXA) 20 MG tablet Take 1 tablet (20 mg total) by mouth daily. 90 tablet 1  . cyclobenzaprine (FLEXERIL) 10 MG tablet Take 1/2 -1 po qhs 30 tablet 2  . lisinopril (PRINIVIL,ZESTRIL) 20 MG tablet Take 1 tablet (20 mg total) by mouth at bedtime. 90 tablet 1  . metFORMIN (GLUCOPHAGE) 500 MG tablet Take 1 tablet (500 mg total) by mouth 2 (two) times daily. (Patient taking differently: Take 500 mg by mouth once. ) 180 tablet 1  . omeprazole (PRILOSEC) 20 MG capsule Take 1 capsule (20 mg total) by mouth daily. 30 capsule 2  . triamcinolone (KENALOG) 0.025 % ointment Apply 1 application topically 2 (two) times daily. 60 g 0   No facility-administered medications prior to visit.     Allergies  Allergen Reactions  . Hctz [Hydrochlorothiazide] Photosensitivity  . Penicillins Itching    Can tolerate cephalosporins.  . Vioxx [Rofecoxib] Itching    Review of Systems  Constitutional: Negative for fever.  HENT: Positive for ear pain.        Right ear pain  Gastrointestinal: Positive for abdominal pain.  Genitourinary:  Positive for dysuria, frequency and urgency. Negative for flank pain and hematuria.       Objective:    Physical Exam  Constitutional: She is oriented to person, place, and time. She appears well-developed and well-nourished.  HENT:  Head: Normocephalic and atraumatic.  Eyes: Conjunctivae are normal.  Abdominal: Soft. Bowel sounds are normal.  Neurological: She is alert and oriented to person, place, and time.    BP (!) 136/95 (BP Location: Left Arm, Patient Position: Sitting, Cuff Size: Normal)   Pulse 79   Temp 98.5 F (36.9 C) (Oral)   Ht 5' (1.524 m)   Wt 217 lb 12.8 oz (98.8 kg)   SpO2 97%   BMI 42.54 kg/m  Wt Readings from Last 3 Encounters:  04/04/19 217 lb 12.8 oz (98.8 kg)  03/06/19 218 lb 9.6 oz (99.2 kg)  10/08/18 217 lb 12.8 oz (98.8 kg)    Health Maintenance Due  Topic Date Due  . HIV Screening  04/15/1988  . TETANUS/TDAP  01/24/2014  . MAMMOGRAM  05/27/2014  . INFLUENZA VACCINE  02/25/2019      Lab Results  Component Value Date   TSH 1.080 11/06/2017   Lab Results  Component Value Date   WBC 9.6 03/03/2018   HGB 12.3 03/03/2018   HCT 38.0 03/03/2018   MCV 89.4 03/03/2018   PLT 267 03/03/2018   Lab Results  Component Value Date   NA 138 03/03/2018   K 3.5 03/03/2018   CO2 25 03/03/2018   GLUCOSE 112 (H) 03/03/2018   BUN 7 03/03/2018   CREATININE 0.68 03/03/2018   BILITOT 0.6 03/03/2018   ALKPHOS 71 03/03/2018   AST 18 03/03/2018   ALT 20 03/03/2018   PROT 7.6 03/03/2018   ALBUMIN 4.1 03/03/2018   CALCIUM 8.8 (L) 03/03/2018   ANIONGAP 8 03/03/2018   Lab Results  Component Value Date   CHOL 140 11/06/2017   Lab Results  Component Value Date   HDL 41 11/06/2017   Lab Results  Component Value Date   LDLCALC 83 11/06/2017   Lab Results  Component Value Date   TRIG 79 11/06/2017   Lab Results  Component Value Date   CHOLHDL 3.4 11/06/2017   Lab Results  Component Value Date   HGBA1C 6.0 (H) 11/06/2017        Assessment & Plan:  1. Urine frequency - POCT URINALYSIS DIP (CLINITEK) - POCT glycosylated hemoglobin (Hb A1C) 2. Prediabetes Glucose noted in the urinalysis - POCT glycosylated hemoglobin (Hb A1C) 6.4%-d/w concern for elevation-decrease carb intake-start with sugary drinks-water with meals Take metformin BID Consider diabetic education for carb counting 3. Essential hypertension Continue to bp -slightly elevated-lisinopril daily 4. Right otitis media with effusion ENT notes-pt will request-chronic effusion-pt states ENT with few suggestions flonase  Code for time-greater than 50% of time used for counseling-diabetes, HTN Basha Krygier Hannah Beat, MD

## 2019-04-05 ENCOUNTER — Telehealth: Payer: Self-pay

## 2019-04-05 DIAGNOSIS — Z13220 Encounter for screening for lipoid disorders: Secondary | ICD-10-CM

## 2019-04-05 DIAGNOSIS — E7800 Pure hypercholesterolemia, unspecified: Secondary | ICD-10-CM

## 2019-04-05 DIAGNOSIS — R5383 Other fatigue: Secondary | ICD-10-CM

## 2019-04-05 DIAGNOSIS — I10 Essential (primary) hypertension: Secondary | ICD-10-CM

## 2019-04-05 LAB — COMPLETE METABOLIC PANEL WITH GFR
AG Ratio: 1.4 (calc) (ref 1.0–2.5)
ALT: 17 U/L (ref 6–29)
AST: 14 U/L (ref 10–35)
Albumin: 4.1 g/dL (ref 3.6–5.1)
Alkaline phosphatase (APISO): 90 U/L (ref 31–125)
BUN: 10 mg/dL (ref 7–25)
CO2: 29 mmol/L (ref 20–32)
Calcium: 9.2 mg/dL (ref 8.6–10.2)
Chloride: 105 mmol/L (ref 98–110)
Creat: 0.73 mg/dL (ref 0.50–1.10)
GFR, Est African American: 115 mL/min/{1.73_m2} (ref >=60)
GFR, Est Non African American: 99 mL/min/{1.73_m2} (ref >=60)
Globulin: 2.9 g/dL (calc) (ref 1.9–3.7)
Glucose, Bld: 118 mg/dL — ABNORMAL HIGH (ref 65–99)
Potassium: 4.5 mmol/L (ref 3.5–5.3)
Sodium: 139 mmol/L (ref 135–146)
Total Bilirubin: 0.3 mg/dL (ref 0.2–1.2)
Total Protein: 7 g/dL (ref 6.1–8.1)

## 2019-04-05 LAB — TSH: TSH: 1.65 mIU/L

## 2019-04-05 LAB — CBC WITH DIFFERENTIAL/PLATELET
Absolute Monocytes: 282 cells/uL (ref 200–950)
Basophils Absolute: 53 cells/uL (ref 0–200)
Basophils Relative: 0.6 %
Eosinophils Absolute: 370 cells/uL (ref 15–500)
Eosinophils Relative: 4.2 %
HCT: 36.1 % (ref 35.0–45.0)
Hemoglobin: 12 g/dL (ref 11.7–15.5)
Lymphs Abs: 2385 cells/uL (ref 850–3900)
MCH: 29.2 pg (ref 27.0–33.0)
MCHC: 33.2 g/dL (ref 32.0–36.0)
MCV: 87.8 fL (ref 80.0–100.0)
MPV: 10.8 fL (ref 7.5–12.5)
Monocytes Relative: 3.2 %
Neutro Abs: 5711 cells/uL (ref 1500–7800)
Neutrophils Relative %: 64.9 %
Platelets: 301 10*3/uL (ref 140–400)
RBC: 4.11 10*6/uL (ref 3.80–5.10)
RDW: 13 % (ref 11.0–15.0)
Total Lymphocyte: 27.1 %
WBC: 8.8 10*3/uL (ref 3.8–10.8)

## 2019-04-05 LAB — LIPID PANEL
Cholesterol: 216 mg/dL — ABNORMAL HIGH (ref <200)
HDL: 42 mg/dL — ABNORMAL LOW (ref >=50)
LDL Cholesterol (Calc): 152 mg/dL (calc) — ABNORMAL HIGH
Non-HDL Cholesterol (Calc): 174 mg/dL (calc) — ABNORMAL HIGH (ref <130)
Total CHOL/HDL Ratio: 5.1 (calc) — ABNORMAL HIGH (ref <5.0)
Triglycerides: 108 mg/dL (ref <150)

## 2019-04-05 MED ORDER — ATORVASTATIN CALCIUM 10 MG PO TABS: 10.0000 mg | ORAL_TABLET | Freq: Every day | ORAL | 1 refills | Status: DC

## 2019-04-05 NOTE — Telephone Encounter (Signed)
LeighAnn Sofiah Lyne, CMA  

## 2019-04-05 NOTE — Telephone Encounter (Signed)
Jessica [name], CMA  

## 2019-04-06 ENCOUNTER — Telehealth: Payer: Self-pay

## 2019-04-06 ENCOUNTER — Other Ambulatory Visit: Payer: Self-pay | Admitting: Family Medicine

## 2019-04-06 LAB — URINALYSIS W MICROSCOPIC + REFLEX CULTURE
Bilirubin Urine: NEGATIVE
Glucose, UA: NEGATIVE
Hyaline Cast: NONE SEEN /LPF
Ketones, ur: NEGATIVE
Nitrites, Initial: POSITIVE — AB
Protein, ur: NEGATIVE
Specific Gravity, Urine: 1.023 (ref 1.001–1.03)
pH: 5.5 (ref 5.0–8.0)

## 2019-04-06 LAB — URINE CULTURE
MICRO NUMBER:: 860469
SPECIMEN QUALITY:: ADEQUATE

## 2019-04-06 LAB — CULTURE INDICATED

## 2019-04-06 MED ORDER — FLUCONAZOLE 150 MG PO TABS: ORAL_TABLET | ORAL | 0 refills | Status: DC

## 2019-04-06 NOTE — Telephone Encounter (Signed)
Trion for diflucan -sent to pharmacy. If not improvement, office visit

## 2019-04-06 NOTE — Telephone Encounter (Signed)
Detailed voicemail was left for the patient

## 2019-04-06 NOTE — Telephone Encounter (Signed)
Routing to Dr. Corum for advice ? 

## 2019-04-06 NOTE — Telephone Encounter (Signed)
Pt called this am to let you know that she has a yeast infection now and needs something for it.

## 2019-04-07 ENCOUNTER — Other Ambulatory Visit: Payer: Self-pay | Admitting: Family Medicine

## 2019-04-07 MED ORDER — NITROFURANTOIN MONOHYD MACRO 100 MG PO CAPS: 100.0000 mg | ORAL_CAPSULE | Freq: Two times a day (BID) | ORAL | 0 refills | Status: DC

## 2019-04-07 NOTE — Telephone Encounter (Signed)
Add macrobid. Sensitivities not completed.  Additional strength will not help if resistant.

## 2019-04-07 NOTE — Telephone Encounter (Signed)
She is aware of the recommendation 

## 2019-04-07 NOTE — Telephone Encounter (Signed)
Jessica [name] is stating that she doesn't feel that the 250 mg antibiotic is helping very much she is stating that she needs the 500 mg to completely wipe it out, please advise?

## 2019-04-12 ENCOUNTER — Ambulatory Visit (INDEPENDENT_AMBULATORY_CARE_PROVIDER_SITE_OTHER): Payer: No Typology Code available for payment source | Admitting: Family Medicine

## 2019-04-12 ENCOUNTER — Ambulatory Visit: Payer: No Typology Code available for payment source

## 2019-04-12 ENCOUNTER — Telehealth: Payer: Self-pay

## 2019-04-12 ENCOUNTER — Ambulatory Visit (INDEPENDENT_AMBULATORY_CARE_PROVIDER_SITE_OTHER): Payer: No Typology Code available for payment source

## 2019-04-12 ENCOUNTER — Other Ambulatory Visit: Payer: Self-pay

## 2019-04-12 VITALS — BP 136/95 | Temp 98.6°F | Ht 60.0 in | Wt 217.0 lb

## 2019-04-12 VITALS — BP 136/95 | HR 79 | Temp 98.5°F | Ht 60.0 in | Wt 217.0 lb

## 2019-04-12 DIAGNOSIS — R319 Hematuria, unspecified: Secondary | ICD-10-CM

## 2019-04-12 DIAGNOSIS — R350 Frequency of micturition: Secondary | ICD-10-CM | POA: Diagnosis not present

## 2019-04-12 LAB — POCT URINALYSIS DIP (CLINITEK)
Bilirubin, UA: NEGATIVE
Glucose, UA: NEGATIVE mg/dL
Ketones, POC UA: NEGATIVE mg/dL
Leukocytes, UA: NEGATIVE
Nitrite, UA: NEGATIVE
POC PROTEIN,UA: NEGATIVE
Spec Grav, UA: 1.02 (ref 1.010–1.025)
Urobilinogen, UA: 0.2 E.U./dL
pH, UA: 6 (ref 5.0–8.0)

## 2019-04-12 NOTE — Telephone Encounter (Signed)
Test for cure-u/a

## 2019-04-12 NOTE — Telephone Encounter (Signed)
Small blood noted otherwise urine clear-drink plenty of water, avoid caffeine. No more antibiotics needed RE-check in 1 month since likely still irritation from infection causing blood.

## 2019-04-12 NOTE — Telephone Encounter (Signed)
Patient is aware of recommendation 

## 2019-04-12 NOTE — Telephone Encounter (Signed)
Patient is aware 

## 2019-04-12 NOTE — Progress Notes (Signed)
Jessica [name], [name]

## 2019-04-12 NOTE — Telephone Encounter (Signed)
Jessica [name] is stating that she has finished the antibiotics, she states that the urgency feeling has gone but she is starting to hurt in her back in the kidney area, she states that usually she has to take the Cipro 500 mg to get completely rid of a UTI, please advise?

## 2019-04-13 ENCOUNTER — Telehealth: Payer: Self-pay

## 2019-04-13 ENCOUNTER — Encounter: Payer: Self-pay | Admitting: Family Medicine

## 2019-04-13 NOTE — Progress Notes (Deleted)
LeighAnn Shinita Mac, CMA  

## 2019-04-13 NOTE — Telephone Encounter (Signed)
Routing to Dr. Corum 

## 2019-04-13 NOTE — Progress Notes (Deleted)
LeighAnn Jaramie Bastos, CMA  

## 2019-04-14 ENCOUNTER — Encounter: Payer: Self-pay | Admitting: Family Medicine

## 2019-04-14 NOTE — Progress Notes (Deleted)
Appointment was r/s

## 2019-04-27 ENCOUNTER — Other Ambulatory Visit: Payer: Self-pay | Admitting: Family Medicine

## 2019-05-01 ENCOUNTER — Telehealth: Payer: No Typology Code available for payment source | Admitting: Family

## 2019-05-01 DIAGNOSIS — R399 Unspecified symptoms and signs involving the genitourinary system: Secondary | ICD-10-CM

## 2019-05-01 DIAGNOSIS — R109 Unspecified abdominal pain: Secondary | ICD-10-CM

## 2019-05-01 NOTE — Progress Notes (Signed)
Based on what you shared with me, I feel your condition warrants further evaluation and I recommend that you be seen for a face to face office visit.  Given you are having UTI symptoms with back pain, you need to be seen face to face for a urine culture to rule out a more serious infection.   NOTE: If you entered your credit card information for this eVisit, you will not be charged. You [place] see a "hold" on your card for the $35 but that hold will drop off and you will not have a charge processed.  If you are having a true medical emergency please call 911.     For an urgent face to face visit, [facility] has four urgent care centers for your convenience:   . [facility]    [phone]                  Get Driving Directions  [id number] [place], [place] [address] . 10 am to 8 pm Monday-Friday . 12 pm to 8 pm Saturday-Sunday   . [facility] [place]                  Get Driving Directions  [id number] [place], [place], [place] [address] . 8 am to 8 pm Monday-Friday . 9 am to 6 pm Saturday . 11 am to 6 pm Sunday   . [facility] at [place]                  Get Driving Directions   [address] [place], [place] [address] . 8 am to 8 pm Monday-Friday . 8 am to 4 pm Saturday-Sunday    . [facility] at [place]                    Get Driving Directions  [id number]  [address], [place], [place] [address]  . Monday-Friday, 12 PM to 6 PM    Your e-visit answers were reviewed by a board certified advanced clinical practitioner to complete your personal care plan.  Thank you for using e-Visits.

## 2019-05-08 NOTE — Progress Notes (Deleted)
Jessica [name], CMA  

## 2019-05-10 ENCOUNTER — Telehealth: Payer: No Typology Code available for payment source | Admitting: Family

## 2019-05-10 DIAGNOSIS — N390 Urinary tract infection, site not specified: Secondary | ICD-10-CM

## 2019-05-10 NOTE — Progress Notes (Signed)
Based on what you shared with me, I feel your condition warrants further evaluation and I recommend that you be seen for a face to face office visit.  After reviewing your chart, it looks you had UTI symptoms on 05/01/19 and was told to follow up face to face to have a urine culture completed. Please make sure you do this and if you already have and your symptoms have returned you need to follow up with your PCP.   NOTE: If you entered your credit card information for this eVisit, you will not be charged. You Kulkarni see a "hold" on your card for the $35 but that hold will drop off and you will not have a charge processed.  If you are having a true medical emergency please call 911.     For an urgent face to face visit, [place] has four urgent care centers for your convenience:   . [facility]    [phone]                  Get Driving Directions  [id number] [place], [place] [address] . 10 am to 8 pm Monday-Friday . 12 pm to 8 pm Saturday-Sunday   . [facility] at [place]                  Get Driving Directions  [id number] [place], [place], [place] [address] . 8 am to 8 pm Monday-Friday . 9 am to 6 pm Saturday . 11 am to 6 pm Sunday   . [facility] at [place]                  Get Driving Directions   [address] [place], [place] [address] . 8 am to 8 pm Monday-Friday . 8 am to 4 pm Saturday-Sunday    . [facility] at [place]                    Get Driving Directions  [id number]  [address], [place], [place] [address]  . Monday-Friday, 12 PM to 6 PM    Your e-visit answers were reviewed by a board certified advanced clinical practitioner to complete your personal care plan.  Thank you for using e-Visits.

## 2019-05-17 MED FILL — LISINOPRIL 20 MG TABLET: 20 | 90 days supply | Qty: 90 | Fill #1

## 2019-05-17 MED FILL — ATORVASTATIN 10 MG TABLET: 10 | 90 days supply | Qty: 90 | Fill #0

## 2019-05-17 MED FILL — metFORMIN HCL 500 MG TABS: 500 | 90 days supply | Qty: 180 | Fill #0

## 2019-05-17 MED FILL — CITALOPRAM HBR 20 MG TABLET: 20 | 90 days supply | Qty: 90 | Fill #1

## 2019-07-06 NOTE — Progress Notes (Deleted)
Poss Uti

## 2019-07-13 ENCOUNTER — Other Ambulatory Visit: Payer: No Typology Code available for payment source

## 2019-08-27 ENCOUNTER — Encounter: Payer: Self-pay | Admitting: Family Medicine

## 2019-08-28 ENCOUNTER — Other Ambulatory Visit: Payer: Self-pay | Admitting: *Deleted

## 2019-08-28 DIAGNOSIS — E7800 Pure hypercholesterolemia, unspecified: Secondary | ICD-10-CM

## 2019-08-28 MED ORDER — CITALOPRAM HYDROBROMIDE 20 MG PO TABS: 20.0000 mg | ORAL_TABLET | Freq: Every day | ORAL | 0 refills | Status: DC

## 2019-08-28 MED ORDER — LISINOPRIL 20 MG PO TABS: 20.0000 mg | ORAL_TABLET | Freq: Every day | ORAL | 0 refills | Status: DC

## 2019-08-28 MED ORDER — ALPRAZOLAM 0.5 MG PO TABS: 0.5000 mg | ORAL_TABLET | Freq: Every day | ORAL | 0 refills | Status: DC | PRN

## 2019-08-28 MED ORDER — ATORVASTATIN CALCIUM 10 MG PO TABS: 10.0000 mg | ORAL_TABLET | Freq: Every day | ORAL | 0 refills | Status: DC

## 2019-08-28 MED FILL — LISINOPRIL 20 MG TABLET: 20 | 90 days supply | Qty: 90 | Fill #0

## 2019-08-28 MED FILL — CITALOPRAM HBR 20 MG TABLET: 20 | 90 days supply | Qty: 90 | Fill #0

## 2019-08-28 MED FILL — ATORVASTATIN 10 MG TABLET: 10 | 90 days supply | Qty: 90 | Fill #0

## 2019-08-28 NOTE — Telephone Encounter (Signed)
Left message to return call. Pt's pcp is listed as dr Holly Bodily. Wanted to clarify with pt why she was asking dr Richardson Landry for refill if she is seeing dr Holly Bodily.

## 2019-08-28 NOTE — Telephone Encounter (Signed)
Left message to return call 

## 2019-08-28 NOTE — Telephone Encounter (Signed)
Called and discussed with pt. Transferred to the front to schedule and get pcp changed back to dr Richardson Landry and refills sent to [facility].

## 2019-08-28 NOTE — Telephone Encounter (Signed)
Pt states her pcp is not dr Holly Bodily. She states she saw her a couple of times but did not switch providers. She wanted to know if she could get refill on citalopram, atorvastatin, xanax and she didn't know which bp med she was on she states she thought it was metoprolol but that is not on her list. I told her lisinopril was on her list and she states what ever she should be taking. Last bp check up was 12/22/18.

## 2019-08-29 MED FILL — ALPRAZolam 0.5 MG TABS: 0.5 | 15 days supply | Qty: 15 | Fill #0

## 2019-09-05 NOTE — Progress Notes (Deleted)
LeighAnn Lanelle Lindo, CMA  

## 2019-09-08 ENCOUNTER — Encounter: Payer: Self-pay | Admitting: Family Medicine

## 2019-09-08 ENCOUNTER — Ambulatory Visit (INDEPENDENT_AMBULATORY_CARE_PROVIDER_SITE_OTHER): Payer: No Typology Code available for payment source | Admitting: Family Medicine

## 2019-09-08 ENCOUNTER — Other Ambulatory Visit: Payer: Self-pay

## 2019-09-08 VITALS — Wt 213.0 lb

## 2019-09-08 DIAGNOSIS — E7800 Pure hypercholesterolemia, unspecified: Secondary | ICD-10-CM

## 2019-09-08 DIAGNOSIS — I10 Essential (primary) hypertension: Secondary | ICD-10-CM | POA: Diagnosis not present

## 2019-09-08 DIAGNOSIS — R7303 Prediabetes: Secondary | ICD-10-CM

## 2019-09-08 MED ORDER — CITALOPRAM HYDROBROMIDE 20 MG PO TABS: 20.0000 mg | ORAL_TABLET | Freq: Every day | ORAL | 1 refills | Status: DC

## 2019-09-08 MED ORDER — OMEPRAZOLE 20 MG PO CPDR: 20.0000 mg | DELAYED_RELEASE_CAPSULE | Freq: Every day | ORAL | 1 refills | Status: DC

## 2019-09-08 MED ORDER — ATORVASTATIN CALCIUM 10 MG PO TABS: 10.0000 mg | ORAL_TABLET | Freq: Every day | ORAL | 1 refills | Status: DC

## 2019-09-08 MED ORDER — PHENTERMINE HCL 37.5 MG PO CAPS: 37.5000 mg | ORAL_CAPSULE | ORAL | 2 refills | Status: DC

## 2019-09-08 MED ORDER — METFORMIN HCL 500 MG PO TABS: 500.0000 mg | ORAL_TABLET | Freq: Two times a day (BID) | ORAL | 1 refills | Status: DC

## 2019-09-08 MED ORDER — LISINOPRIL 20 MG PO TABS: 20.0000 mg | ORAL_TABLET | Freq: Every day | ORAL | 1 refills | Status: DC

## 2019-09-08 MED ORDER — ALPRAZOLAM 0.5 MG PO TABS: 0.5000 mg | ORAL_TABLET | Freq: Every day | ORAL | 5 refills | Status: AC | PRN

## 2019-09-08 MED FILL — OMEPRAZOLE DR 20 MG CAPSULE: 20 | 90 days supply | Qty: 90 | Fill #0

## 2019-09-08 MED FILL — metFORMIN HCL 500 MG TABS: 500 | 90 days supply | Qty: 180 | Fill #0

## 2019-09-08 MED FILL — PHENTERMINE 37.5 MG TABLET: 37.5 | 30 days supply | Qty: 30 | Fill #0

## 2019-09-08 NOTE — Progress Notes (Signed)
   Subjective:  Audio plus video  Patient ID: Jessica [name], female    DOB: 02/07/1971, 49 y.o.   MRN: [id number]  HPI Pt here for med check. Pt states she is doing ok. Taking meds as prescribed. Pt lost Mom back in December. Pt checks blood pressure when she feels a headache coming on, but blood pressure is usually pretty good.  Pt is wanting to try weight loss medicine. Pt have back pain that she thinks is her endometriosis.   Virtual Visit via Telephone Note  I connected with Aleeha M Sunde on 09/08/19 at 10:00 AM EST by telephone and verified that I am speaking with the correct person using two identifiers.  Location: Patient: home Provider: office   I discussed the limitations, risks, security and privacy concerns of performing an evaluation and management service by telephone and the availability of in person appointments. I also discussed with the patient that there Quinney be a patient responsible charge related to this service. The patient expressed understanding and agreed to proceed.   History of Present Illness:    Observations/Objective:   Assessment and Plan:   Follow Up Instructions:    I discussed the assessment and treatment plan with the patient. The patient was provided an opportunity to ask questions and all were answered. The patient agreed with the plan and demonstrated an understanding of the instructions.   The patient was advised to call back or seek an in-person evaluation if the symptoms worsen or if the condition fails to improve as anticipated.  I provided 30 minutes of non-face-to-face time during this encounter.  Blood pressure medicine and blood pressure levels reviewed today with patient. Compliant with blood pressure medicine. States does not miss a dose. No obvious side effects. Blood pressure generally good when checked elsewhere. Watching salt intake.   Patient continues to take lipid medication regularly. No obvious side effects from it. Generally  does not miss a dose. Prior blood work results are reviewed with patient. Patient continues to work on fat intake in diet  Patient continues to gain excess amount of weight.  Used phentermine in the past but did not give it a fair trial per patient   Review of Systems No headache no chest pain no shortness of breath    Objective:   Physical Exam  Virtual      Assessment & Plan:  Impression hypertension overall good control discussed by discussed medication refill  2.  Prediabetes status uncertain.  Started on Metformin.  Patient to maintain.  Await A1c  3.  Hyperlipidemia status uncertain discussed will evaluate  4.  Stress anxiety recent loss of mother.  Celexa to maintain  5.  Morbid obesity.  Options discussed.  Will initiate phentermine

## 2019-09-13 DIAGNOSIS — R319 Hematuria, unspecified: Secondary | ICD-10-CM | POA: Insufficient documentation

## 2019-09-20 LAB — HEPATIC FUNCTION PANEL
AG Ratio: 1.6 (calc) (ref 1.0–2.5)
ALT: 23 U/L (ref 6–29)
AST: 18 U/L (ref 10–35)
Albumin: 4.6 g/dL (ref 3.6–5.1)
Alkaline phosphatase (APISO): 67 U/L (ref 31–125)
Bilirubin, Direct: 0.1 mg/dL (ref 0.0–0.2)
Globulin: 2.9 g/dL (calc) (ref 1.9–3.7)
Indirect Bilirubin: 0.4 mg/dL (calc) (ref 0.2–1.2)
Total Bilirubin: 0.5 mg/dL (ref 0.2–1.2)
Total Protein: 7.5 g/dL (ref 6.1–8.1)

## 2019-09-20 LAB — HEMOGLOBIN A1C
Hgb A1c MFr Bld: 6 % of total Hgb — ABNORMAL HIGH (ref <5.7)
Mean Plasma Glucose: 126 (calc)
eAG (mmol/L): 7 (calc)

## 2019-09-20 LAB — LIPID PANEL
Cholesterol: 113 mg/dL (ref <200)
HDL: 37 mg/dL — ABNORMAL LOW (ref >=50)
LDL Cholesterol (Calc): 59 mg/dL (calc)
Non-HDL Cholesterol (Calc): 76 mg/dL (calc) (ref <130)
Total CHOL/HDL Ratio: 3.1 (calc) (ref <5.0)
Triglycerides: 87 mg/dL (ref <150)

## 2019-09-20 LAB — GLUCOSE, RANDOM: Glucose, Bld: 94 mg/dL (ref 65–99)

## 2019-09-24 ENCOUNTER — Encounter: Payer: Self-pay | Admitting: Family Medicine

## 2019-09-25 ENCOUNTER — Encounter: Payer: Self-pay | Admitting: Family Medicine

## 2019-09-25 NOTE — Telephone Encounter (Signed)
Jessica Kirschner, MD     Probably not, if you want to keep the numbers excellent

## 2019-10-10 MED FILL — PHENTERMINE 37.5 MG TABLET: 37.5 | 30 days supply | Qty: 30 | Fill #1

## 2019-11-09 MED FILL — PHENTERMINE 37.5 MG TABLET: 37.5 | 30 days supply | Qty: 30 | Fill #2

## 2019-12-01 ENCOUNTER — Encounter: Payer: Self-pay | Admitting: Family Medicine

## 2019-12-04 ENCOUNTER — Other Ambulatory Visit: Payer: Self-pay | Admitting: *Deleted

## 2019-12-04 NOTE — Telephone Encounter (Signed)
That is awesome! As long as handling well and getting good results, will authorize three more months worth. We do try to have folks step away from the med for awhile at the six month mark,

## 2019-12-05 MED ORDER — PHENTERMINE HCL 37.5 MG PO CAPS: 37.5000 mg | ORAL_CAPSULE | ORAL | 2 refills | Status: DC

## 2019-12-06 ENCOUNTER — Encounter: Payer: Self-pay | Admitting: Family Medicine

## 2019-12-07 ENCOUNTER — Encounter: Payer: Self-pay | Admitting: Nurse Practitioner

## 2019-12-07 ENCOUNTER — Other Ambulatory Visit: Payer: Self-pay | Admitting: Nurse Practitioner

## 2019-12-07 ENCOUNTER — Telehealth (INDEPENDENT_AMBULATORY_CARE_PROVIDER_SITE_OTHER): Payer: No Typology Code available for payment source | Admitting: Nurse Practitioner

## 2019-12-07 ENCOUNTER — Other Ambulatory Visit: Payer: Self-pay

## 2019-12-07 ENCOUNTER — Telehealth: Payer: Self-pay | Admitting: *Deleted

## 2019-12-07 DIAGNOSIS — J069 Acute upper respiratory infection, unspecified: Secondary | ICD-10-CM

## 2019-12-07 DIAGNOSIS — B9689 Other specified bacterial agents as the cause of diseases classified elsewhere: Secondary | ICD-10-CM

## 2019-12-07 DIAGNOSIS — J029 Acute pharyngitis, unspecified: Secondary | ICD-10-CM

## 2019-12-07 MED ORDER — AZITHROMYCIN 250 MG PO TABS: ORAL_TABLET | ORAL | 0 refills | Status: DC

## 2019-12-07 MED ORDER — HYDROCODONE-HOMATROPINE 5-1.5 MG/5ML PO SYRP: 5.0000 mL | ORAL_SOLUTION | ORAL | 0 refills | Status: DC | PRN

## 2019-12-07 NOTE — Telephone Encounter (Signed)
Ms. jru, bellmore are scheduled for a virtual visit with your provider today.    Just as we do with appointments in the office, we must obtain your consent to participate.  Your consent will be active for this visit and any virtual visit you Cazarez have with one of our providers in the next 365 days.    If you have a MyChart account, I can also send a copy of this consent to you electronically.  All virtual visits are billed to your insurance company just like a traditional visit in the office.  As this is a virtual visit, video technology does not allow for your provider to perform a traditional examination.  This Dollar limit your provider's ability to fully assess your condition.  If your provider identifies any concerns that need to be evaluated in person or the need to arrange testing such as labs, EKG, etc, we will make arrangements to do so.    Although advances in technology are sophisticated, we cannot ensure that it will always work on either your end or our end.  If the connection with a video visit is poor, we [place] have to switch to a telephone visit.  With either a video or telephone visit, we are not always able to ensure that we have a secure connection.   I need to obtain your verbal consent now.   Are you willing to proceed with your visit today?   Shaynee Gingery Redder has provided verbal consent on 12/07/2019 for a virtual visit (video or telephone).   Patsy Lager, LPN [id number]  [id number]

## 2019-12-07 NOTE — Progress Notes (Signed)
  VIDEO VISIT Subjective:    Patient ID: Jessica [name], female    DOB: January 21, 1971, 49 y.o.   MRN: [id number]  HPI  Patient calls in today with worsening symptoms that started Monday. Began with nausea/vomitting which subsided, however now has cough that is worse at night, sneezing, runny nose, ear pressure and pain,  Malaise. Producing green/brown mucous at times. No wheezing.  "Ball like razor blade sore throat". Hot/cold but no record of fever, ongoing frontal headache. Patient has tried several OTC medications with no relief. Has allergy to penicillin. COVID testing negative.     Review of Systems      Objective:   Physical Exam Virtual Visit via Video Note  I connected with Jessica [name] on 12/07/19 at  9:40 AM EDT by a video enabled telemedicine application and verified that I am speaking with the correct person using two identifiers.  Location: Patient: home Provider: office   I discussed the limitations of evaluation and management by telemedicine and the availability of in person appointments. The patient expressed understanding and agreed to proceed.  History of Present Illness: See above.    Observations/Objective: Format  Patient present at home Provider present at office Consent for interaction obtained Coronavirus outbreak made virtual visit necessary  Patient alert & oriented, sitting up in recliner in no distress. Thoughts logical, coherent and relevant.  Assessment and Plan: Bacterial upper respiratory infection  Acute pharyngitis, unspecified etiology   Increase fluids. Continue to take your OTC medications for symptom relief. Take Zpack as directed.  Follow up as needed or call the office if symptoms get worse or not improved.   Meds ordered this encounter  Medications  . azithromycin (ZITHROMAX Z-PAK) 250 MG tablet    Sig: Take 2 tablets (500 mg) on  Day 1,  followed by 1 tablet (250 mg) once daily on Days 2 through 5.    Dispense:  6 each    Refill:   0    Order Specific Question:   Supervising Provider    Answer:   Sallee Lange A [[id number]]   Follow Up Instructions: Warning signs discussed. Call back next week if no improvement, sooner if worse.    I discussed the assessment and treatment plan with the patient. The patient was provided an opportunity to ask questions and all were answered. The patient agreed with the plan and demonstrated an understanding of the instructions.   The patient was advised to call back or seek an in-person evaluation if the symptoms worsen or if the condition fails to improve as anticipated.  I provided 15 minutes of non-face-to-face time during this encounter.   Gertie Fey, RN, BSN AGNP student        Assessment & Plan:

## 2019-12-07 NOTE — Progress Notes (Signed)
   Subjective:    Patient ID: Jessica [name], female    DOB: [birth year], 49 y.o.   MRN: [id number]  URI  This is a new problem. Episode onset: Started monday. The problem has been gradually worsening. Associated symptoms include abdominal pain, congestion, coughing, ear pain, sneezing, a sore throat and vomiting. Associated symptoms comments: Back pain, chills. . Treatments tried: nasacort, alleryg&cold meds. The treatment provided no relief.    Requesting refill on chronic meds.   Review of Systems  HENT: Positive for congestion, ear pain, sneezing and sore throat.   Respiratory: Positive for cough.   Gastrointestinal: Positive for abdominal pain and vomiting.       Objective:   Physical Exam    Virtual Visit via Video Note  I connected with Mikhia M Soderquist on 12/07/19 at  9:40 AM EDT by a video enabled telemedicine application and verified that I am speaking with the correct person using two identifiers.  Location: Patient: home Provider: office    I discussed the limitations of evaluation and management by telemedicine and the availability of in person appointments. The patient expressed understanding and agreed to proceed.  History of Present Illness:    Observations/Objective:   Assessment and Plan:   Follow Up Instructions:    I discussed the assessment and treatment plan with the patient. The patient was provided an opportunity to ask questions and all were answered. The patient agreed with the plan and demonstrated an understanding of the instructions.   The patient was advised to call back or seek an in-person evaluation if the symptoms worsen or if the condition fails to improve as anticipated.  I provided 15 minutes of non-face-to-face time during this encounter.     Assessment & Plan:

## 2019-12-11 ENCOUNTER — Other Ambulatory Visit: Payer: Self-pay | Admitting: Nurse Practitioner

## 2019-12-11 MED ORDER — CEFPROZIL 500 MG PO TABS: 500.0000 mg | ORAL_TABLET | Freq: Two times a day (BID) | ORAL | 0 refills | Status: DC

## 2019-12-12 ENCOUNTER — Other Ambulatory Visit: Payer: Self-pay | Admitting: Nurse Practitioner

## 2019-12-12 ENCOUNTER — Encounter: Payer: Self-pay | Admitting: Family Medicine

## 2019-12-12 MED ORDER — HYDROCODONE-HOMATROPINE 5-1.5 MG/5ML PO SYRP: 5.0000 mL | ORAL_SOLUTION | ORAL | 0 refills | Status: DC | PRN

## 2019-12-13 ENCOUNTER — Other Ambulatory Visit: Payer: Self-pay | Admitting: *Deleted

## 2019-12-13 MED ORDER — PHENTERMINE HCL 37.5 MG PO TABS: 37.5000 mg | ORAL_TABLET | Freq: Every day | ORAL | 2 refills | Status: DC

## 2019-12-13 NOTE — Telephone Encounter (Signed)
Med printed. Await signature then will fax and send pt a message

## 2019-12-13 NOTE — Telephone Encounter (Signed)
Sent in on 5/11 with 2 extra refills. Ok to change to tablets?

## 2019-12-19 MED FILL — LISINOPRIL 20 MG TABLET: 20 | 90 days supply | Qty: 90 | Fill #0

## 2019-12-19 MED FILL — metFORMIN HCL 500 MG TABS: 500 | 90 days supply | Qty: 180 | Fill #0

## 2019-12-19 MED FILL — ALPRAZolam 0.5 MG TABS: 0.5 | 15 days supply | Qty: 15 | Fill #0

## 2019-12-19 MED FILL — ATORVASTATIN 10 MG TABLET: 10 | 90 days supply | Qty: 90 | Fill #0

## 2019-12-19 MED FILL — CITALOPRAM HBR 20 MG TABLET: 20 | 90 days supply | Qty: 90 | Fill #0

## 2020-02-15 ENCOUNTER — Telehealth (INDEPENDENT_AMBULATORY_CARE_PROVIDER_SITE_OTHER): Payer: No Typology Code available for payment source | Admitting: Family Medicine

## 2020-02-15 DIAGNOSIS — J019 Acute sinusitis, unspecified: Secondary | ICD-10-CM | POA: Diagnosis not present

## 2020-02-15 MED ORDER — CEFDINIR 300 MG PO CAPS: ORAL_CAPSULE | ORAL | 0 refills | Status: DC

## 2020-02-15 MED ORDER — HYDROCOD POLST-CPM POLST ER 10-8 MG/5ML PO SUER: 5.0000 mL | Freq: Two times a day (BID) | ORAL | 0 refills | Status: DC | PRN

## 2020-02-15 NOTE — Progress Notes (Signed)
Patient ID: Jessica [name], female    DOB: 30-Aug-1970, 49 y.o.   MRN: [id number]  Virtual Visit via Video Note  I connected with Nohelani M Mogan on 02/15/20 at  4:10 PM EDT by a video enabled telemedicine application and verified that I am speaking with the correct person using two identifiers.  Location: Patient: work Provider: Editor, commissioning Complaint  Patient presents with  . Cough    congested and body aches- hit her fast yesterday-   Subjective:    HPI Pt having coughing, congestion.  Started yesterday. Pt stating feeling like her sinus infection she usually gets.  Had this about 2 months ago.  Pt stating took a couple antibiotics to get it better.  Was on zpak then on cefzil.  Has tried claritin, flonase.  Dry coughing. Denies fever. 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  None that she is aware of. Feeling a post nasal drip. Pt hasn't had covid vaccines.  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  Medical History Belanna has a past medical history of Anxiety, Cystitis, chronic, Gross hematuria, High cholesterol, Hypertension, and Obesity.   Outpatient Encounter Medications as of 02/15/2020  Medication Sig  . ALPRAZolam (XANAX) 0.5 MG tablet Take 1 tablet (0.5 mg total) by mouth daily as needed for anxiety.  Marland Kitchen atorvastatin (LIPITOR) 10 MG tablet Take 1 tablet (10 mg total) by mouth daily.  . cefdinir (OMNICEF) 300 MG capsule Take one capsule twice daily for 7 days  . cetirizine (ZYRTEC) 10 MG tablet Take 1 tablet (10 mg total) by mouth daily for 30 days.  . chlorpheniramine-HYDROcodone (TUSSIONEX PENNKINETIC ER) 10-8 MG/5ML SUER Take 5 mLs by mouth every 12 (twelve) hours as needed for cough.  . citalopram (CELEXA) 20 MG tablet Take 1 tablet (20 mg total) by mouth daily.  Marland Kitchen HYDROcodone-homatropine (HYCODAN) 5-1.5 MG/5ML syrup Take 5 mLs by mouth every 4 (four) hours as needed.  Marland Kitchen lisinopril (ZESTRIL) 20 MG tablet Take 1 tablet (20 mg total) by mouth at  bedtime.  . metFORMIN (GLUCOPHAGE) 500 MG tablet Take 1 tablet (500 mg total) by mouth 2 (two) times daily.  Marland Kitchen omeprazole (PRILOSEC) 20 MG capsule Take 1 capsule (20 mg total) by mouth daily.  . phentermine (ADIPEX-P) 37.5 MG tablet Take 1 tablet (37.5 mg total) by mouth daily before breakfast.  . phentermine 37.5 MG capsule Take 1 capsule (37.5 mg total) by mouth every morning.  . [DISCONTINUED] cefPROZIL (CEFZIL) 500 MG tablet Take 1 tablet (500 mg total) by mouth 2 (two) times daily.   No facility-administered encounter medications on file as of 02/15/2020.     Review of Systems  Constitutional: Negative for chills and fever.  HENT: Positive for congestion and rhinorrhea. Negative for ear pain, sinus pressure, sinus pain and sore throat.   Eyes: Negative for pain, discharge and itching.  Respiratory: Positive for cough.   Gastrointestinal: Negative for constipation, diarrhea, nausea and vomiting.     Vitals There were no vitals taken for this visit.  Objective:   Physical Exam Video visit. No PE.  Assessment and Plan   1. Acute sinusitis, recurrence not specified, unspecified location - cefdinir (OMNICEF) 300 MG capsule; Take one capsule twice daily for 7 days  Dispense: 14 capsule; Refill: 0 - chlorpheniramine-HYDROcodone (TUSSIONEX PENNKINETIC ER) 10-8 MG/5ML SUER; Take 5 mLs by mouth every 12 (twelve) hours as needed for cough.  Dispense: 60 mL; Refill: 0    Advising symptomatic tx  with mucinex, flonase, netti-pot prn. Tylenol prn. If not improving Jerry start cefzil.  Reviewed viral illness vs bacterial sinusitis and likely in viral process at this point. Advising to get covid testing.  Call with results and can given work note and instructions once results are back.  Advising quarantining till results back from covid testing. Symptomatic treatment given for fever and bodyaches with tylenol/ibuprofen and increase in fluids.    Pt in agreement with plan.    F/u  prn.  Follow Up Instructions:    I discussed the assessment and treatment plan with the patient. The patient was provided an opportunity to ask questions and all were answered. The patient agreed with the plan and demonstrated an understanding of the instructions.   The patient was advised to call back or seek an in-person evaluation if the symptoms worsen or if the condition fails to improve as anticipated.  I provided 12 minutes of non-face-to-face time during this encounter.

## 2020-02-26 ENCOUNTER — Encounter: Payer: Self-pay | Admitting: Nurse Practitioner

## 2020-02-27 NOTE — Telephone Encounter (Signed)
Pt has been scheduled with Santiago Glad 8/19. Hoyle Sauer doesn't have a cpe before sept available. Pt would like lab work ordered before appt.

## 2020-03-04 ENCOUNTER — Telehealth: Payer: Self-pay | Admitting: Family Medicine

## 2020-03-04 ENCOUNTER — Other Ambulatory Visit: Payer: Self-pay | Admitting: Family Medicine

## 2020-03-04 DIAGNOSIS — Z79899 Other long term (current) drug therapy: Secondary | ICD-10-CM

## 2020-03-04 DIAGNOSIS — I10 Essential (primary) hypertension: Secondary | ICD-10-CM

## 2020-03-04 DIAGNOSIS — R7303 Prediabetes: Secondary | ICD-10-CM

## 2020-03-04 DIAGNOSIS — E7800 Pure hypercholesterolemia, unspecified: Secondary | ICD-10-CM

## 2020-03-04 NOTE — Telephone Encounter (Signed)
Lab orders changed to [facility]. Pt sent My Chart message and was informed of labs.

## 2020-03-04 NOTE — Addendum Note (Signed)
Addended by: [name] on: 03/04/2020 03:20 PM   Modules accepted: Orders

## 2020-03-04 NOTE — Telephone Encounter (Signed)
Labs ordered per Santiago Glad. Left message to return call and also sent my chart message. (labs need to be done either today or tomorrow so results will be back Friday)

## 2020-03-06 LAB — HEMOGLOBIN A1C
Hgb A1c MFr Bld: 6 % of total Hgb — ABNORMAL HIGH (ref <5.7)
Mean Plasma Glucose: 126 (calc)
eAG (mmol/L): 7 (calc)

## 2020-03-06 LAB — COMPREHENSIVE METABOLIC PANEL
AG Ratio: 1.5 (calc) (ref 1.0–2.5)
ALT: 11 U/L (ref 6–29)
AST: 12 U/L (ref 10–35)
Albumin: 4 g/dL (ref 3.6–5.1)
Alkaline phosphatase (APISO): 92 U/L (ref 31–125)
BUN: 12 mg/dL (ref 7–25)
CO2: 27 mmol/L (ref 20–32)
Calcium: 9.1 mg/dL (ref 8.6–10.2)
Chloride: 105 mmol/L (ref 98–110)
Creat: 0.8 mg/dL (ref 0.50–1.10)
Globulin: 2.7 g/dL (calc) (ref 1.9–3.7)
Glucose, Bld: 106 mg/dL — ABNORMAL HIGH (ref 65–99)
Potassium: 4.2 mmol/L (ref 3.5–5.3)
Sodium: 139 mmol/L (ref 135–146)
Total Bilirubin: 0.2 mg/dL (ref 0.2–1.2)
Total Protein: 6.7 g/dL (ref 6.1–8.1)

## 2020-03-06 LAB — MICROALBUMIN / CREATININE URINE RATIO
Creatinine, Urine: 209 mg/dL (ref 20–275)
Microalb Creat Ratio: 5 mcg/mg creat (ref <30)
Microalb, Ur: 1.1 mg/dL

## 2020-03-06 LAB — LIPID PANEL
Cholesterol: 152 mg/dL (ref <200)
HDL: 46 mg/dL — ABNORMAL LOW (ref >=50)
LDL Cholesterol (Calc): 85 mg/dL (calc)
Non-HDL Cholesterol (Calc): 106 mg/dL (calc) (ref <130)
Total CHOL/HDL Ratio: 3.3 (calc) (ref <5.0)
Triglycerides: 114 mg/dL (ref <150)

## 2020-03-08 ENCOUNTER — Other Ambulatory Visit: Payer: Self-pay | Admitting: Family Medicine

## 2020-03-08 ENCOUNTER — Ambulatory Visit (INDEPENDENT_AMBULATORY_CARE_PROVIDER_SITE_OTHER): Payer: No Typology Code available for payment source | Admitting: Family Medicine

## 2020-03-08 ENCOUNTER — Encounter: Payer: Self-pay | Admitting: Family Medicine

## 2020-03-08 ENCOUNTER — Other Ambulatory Visit: Payer: Self-pay

## 2020-03-08 VITALS — BP 132/96 | HR 102 | Temp 97.8°F | Ht 61.0 in | Wt 193.4 lb

## 2020-03-08 DIAGNOSIS — Z0000 Encounter for general adult medical examination without abnormal findings: Secondary | ICD-10-CM | POA: Diagnosis not present

## 2020-03-08 DIAGNOSIS — E7800 Pure hypercholesterolemia, unspecified: Secondary | ICD-10-CM | POA: Diagnosis not present

## 2020-03-08 DIAGNOSIS — R300 Dysuria: Secondary | ICD-10-CM

## 2020-03-08 MED ORDER — CITALOPRAM HYDROBROMIDE 20 MG PO TABS: 20.0000 mg | ORAL_TABLET | Freq: Every day | ORAL | 1 refills | Status: DC

## 2020-03-08 MED ORDER — METFORMIN HCL 500 MG PO TABS: 500.0000 mg | ORAL_TABLET | Freq: Two times a day (BID) | ORAL | 1 refills | Status: DC

## 2020-03-08 MED ORDER — NITROFURANTOIN MONOHYD MACRO 100 MG PO CAPS
100.0000 mg | ORAL_CAPSULE | Freq: Two times a day (BID) | ORAL | 0 refills | Status: AC
Start: 2020-03-08 — End: 2020-03-15

## 2020-03-08 MED ORDER — ATORVASTATIN CALCIUM 10 MG PO TABS: 10.0000 mg | ORAL_TABLET | Freq: Every day | ORAL | 1 refills | Status: DC

## 2020-03-08 MED ORDER — LISINOPRIL 20 MG PO TABS: 20.0000 mg | ORAL_TABLET | Freq: Every day | ORAL | 1 refills | Status: DC

## 2020-03-08 MED FILL — METFORMIN HCL 500 MG TABS: 500 | 90 days supply | Qty: 180 | Fill #0

## 2020-03-08 MED FILL — ATORVASTATIN CALCIUM 10 MG: 10 | 90 days supply | Qty: 90 | Fill #0

## 2020-03-08 MED FILL — LISINOPRIL 20 MG TABLET: 20 | 90 days supply | Qty: 90 | Fill #0

## 2020-03-08 MED FILL — CITALOPRAM HBR 20 MG TABLET: 20 | 90 days supply | Qty: 90 | Fill #0

## 2020-03-08 NOTE — Patient Instructions (Addendum)
Monitor heart rate and blood pressure and send me through MyChart with results.     DASH Eating Plan DASH stands for "Dietary Approaches to Stop Hypertension." The DASH eating plan is a healthy eating plan that has been shown to reduce high blood pressure (hypertension). It Sporer also reduce your risk for type 2 diabetes, heart disease, and stroke. The DASH eating plan Knisely also help with weight loss. What are tips for following this plan?  General guidelines  Avoid eating more than 2,300 mg (milligrams) of salt (sodium) a day. If you have hypertension, you Earhart need to reduce your sodium intake to 1,500 mg a day.  Limit alcohol intake to no more than 1 drink a day for nonpregnant women and 2 drinks a day for men. One drink equals 12 oz of beer, 5 oz of wine, or 1 oz of hard liquor.  Work with your health care provider to maintain a healthy body weight or to lose weight. Ask what an ideal weight is for you.  Get at least 30 minutes of exercise that causes your heart to beat faster (aerobic exercise) most days of the week. Activities [place] include walking, swimming, or biking.  Work with your health care provider or diet and nutrition specialist (dietitian) to adjust your eating plan to your individual calorie needs. Reading food labels   Check food labels for the amount of sodium per serving. Choose foods with less than 5 percent of the Daily Value of sodium. Generally, foods with less than 300 mg of sodium per serving fit into this eating plan.  To find whole grains, look for the word "whole" as the first word in the ingredient list. Shopping  Buy products labeled as "low-sodium" or "no salt added."  Buy fresh foods. Avoid canned foods and premade or frozen meals. Cooking  Avoid adding salt when cooking. Use salt-free seasonings or herbs instead of table salt or sea salt. Check with your health care provider or pharmacist before using salt substitutes.  Do not fry foods. Cook foods  using healthy methods such as baking, boiling, grilling, and broiling instead.  Cook with heart-healthy oils, such as olive, canola, soybean, or sunflower oil. Meal planning  Eat a balanced diet that includes: ? 5 or more servings of fruits and vegetables each day. At each meal, try to fill half of your plate with fruits and vegetables. ? Up to 6-8 servings of whole grains each day. ? Less than 6 oz of lean meat, poultry, or fish each day. A 3-oz serving of meat is about the same size as a deck of cards. One egg equals 1 oz. ? 2 servings of low-fat dairy each day. ? A serving of nuts, seeds, or beans 5 times each week. ? Heart-healthy fats. Healthy fats called Omega-3 fatty acids are found in foods such as flaxseeds and coldwater fish, like sardines, salmon, and mackerel.  Limit how much you eat of the following: ? Canned or prepackaged foods. ? Food that is high in trans fat, such as fried foods. ? Food that is high in saturated fat, such as fatty meat. ? Sweets, desserts, sugary drinks, and other foods with added sugar. ? Full-fat dairy products.  Do not salt foods before eating.  Try to eat at least 2 vegetarian meals each week.  Eat more home-cooked food and less restaurant, buffet, and fast food.  When eating at a restaurant, ask that your food be prepared with less salt or no salt, if possible. What  foods are recommended? The items listed Federici not be a complete list. Talk with your dietitian about what dietary choices are best for you. Grains Whole-grain or whole-wheat bread. Whole-grain or whole-wheat pasta. Brown rice. Modena Morrow. Bulgur. Whole-grain and low-sodium cereals. Pita bread. Low-fat, low-sodium crackers. Whole-wheat flour tortillas. Vegetables Fresh or frozen vegetables (raw, steamed, roasted, or grilled). Low-sodium or reduced-sodium tomato and vegetable juice. Low-sodium or reduced-sodium tomato sauce and tomato paste. Low-sodium or reduced-sodium canned  vegetables. Fruits All fresh, dried, or frozen fruit. Canned fruit in natural juice (without added sugar). Meat and other protein foods Skinless chicken or [place]. Ground chicken or [place]. Pork with fat trimmed off. Fish and seafood. Egg whites. Dried beans, peas, or lentils. Unsalted nuts, nut butters, and seeds. Unsalted canned beans. Lean cuts of beef with fat trimmed off. Low-sodium, lean deli meat. Dairy Low-fat (1%) or fat-free (skim) milk. Fat-free, low-fat, or reduced-fat cheeses. Nonfat, low-sodium ricotta or cottage cheese. Low-fat or nonfat yogurt. Low-fat, low-sodium cheese. Fats and oils Soft margarine without trans fats. Vegetable oil. Low-fat, reduced-fat, or light mayonnaise and salad dressings (reduced-sodium). Canola, safflower, olive, soybean, and sunflower oils. Avocado. Seasoning and other foods Herbs. Spices. Seasoning mixes without salt. Unsalted popcorn and pretzels. Fat-free sweets. What foods are not recommended? The items listed Kubin not be a complete list. Talk with your dietitian about what dietary choices are best for you. Grains Baked goods made with fat, such as croissants, muffins, or some breads. Dry pasta or rice meal packs. Vegetables Creamed or fried vegetables. Vegetables in a cheese sauce. Regular canned vegetables (not low-sodium or reduced-sodium). Regular canned tomato sauce and paste (not low-sodium or reduced-sodium). Regular tomato and vegetable juice (not low-sodium or reduced-sodium). Angie Fava. Olives. Fruits Canned fruit in a light or heavy syrup. Fried fruit. Fruit in cream or butter sauce. Meat and other protein foods Fatty cuts of meat. Ribs. Fried meat. Berniece Salines. Sausage. Bologna and other processed lunch meats. Salami. Fatback. Hotdogs. Bratwurst. Salted nuts and seeds. Canned beans with added salt. Canned or smoked fish. Whole eggs or egg yolks. Chicken or [place] with skin. Dairy Whole or 2% milk, cream, and half-and-half. Whole or full-fat  cream cheese. Whole-fat or sweetened yogurt. Full-fat cheese. Nondairy creamers. Whipped toppings. Processed cheese and cheese spreads. Fats and oils Butter. Stick margarine. Lard. Shortening. Ghee. Bacon fat. Tropical oils, such as coconut, palm kernel, or palm oil. Seasoning and other foods Salted popcorn and pretzels. Onion salt, garlic salt, seasoned salt, table salt, and sea salt. Worcestershire sauce. Tartar sauce. Barbecue sauce. Teriyaki sauce. Soy sauce, including reduced-sodium. Steak sauce. Canned and packaged gravies. Fish sauce. Oyster sauce. Cocktail sauce. Horseradish that you find on the shelf. Ketchup. Mustard. Meat flavorings and tenderizers. Bouillon cubes. Hot sauce and Tabasco sauce. Premade or packaged marinades. Premade or packaged taco seasonings. Relishes. Regular salad dressings. Where to find more information:  National Heart, Lung, and [place]: https://wilson-eaton.com/  American Heart Association: www.heart.org Summary  The DASH eating plan is a healthy eating plan that has been shown to reduce high blood pressure (hypertension). It Kitzmiller also reduce your risk for type 2 diabetes, heart disease, and stroke.  With the DASH eating plan, you should limit salt (sodium) intake to 2,300 mg a day. If you have hypertension, you Mccaughey need to reduce your sodium intake to 1,500 mg a day.  When on the DASH eating plan, aim to eat more fresh fruits and vegetables, whole grains, lean proteins, low-fat dairy, and heart-healthy fats.  Work with  your health care provider or diet and nutrition specialist (dietitian) to adjust your eating plan to your individual calorie needs. This information is not intended to replace advice given to you by your health care provider. Make sure you discuss any questions you have with your health care provider. Document Revised: 06/25/2017 Document Reviewed: 07/06/2016 Elsevier Patient Education  2020 [employer].

## 2020-03-08 NOTE — Progress Notes (Signed)
Patient ID: Jessica [name], female    DOB: 03-Apr-1971, 49 y.o.   MRN: [id number]   Chief Complaint  Patient presents with  . Annual Exam   Subjective:    HPI The patient comes in today for a wellness visit.    A review of their health history was completed.  A review of medications was also completed.  Any needed refills: Atorvastin, citalopram, lisinopril, metformin  Eating habits: discussed plant-based diets, DASH diet  Falls/  MVA accidents in past few months: none  Regular exercise: instructed to start slow and monitor heart rate  Specialist pt sees on regular basis: none  Preventative health issues were discussed.   Additional concerns: skin tag on right thigh Thinks she has an uti. Took cipro 500mg  2 times yesterday and azo.    Medical History Jessica [name] has a past medical history of Anxiety, Cystitis, chronic, Gross hematuria, High cholesterol, Hypertension, and Obesity.   Outpatient Encounter Medications as of 03/08/2020  Medication Sig  . ALPRAZolam (XANAX) 0.5 MG tablet Take 1 tablet (0.5 mg total) by mouth daily as needed for anxiety.  Marland Kitchen atorvastatin (LIPITOR) 10 MG tablet Take 1 tablet (10 mg total) by mouth daily.  . citalopram (CELEXA) 20 MG tablet Take 1 tablet (20 mg total) by mouth daily.  Marland Kitchen lisinopril (ZESTRIL) 20 MG tablet Take 1 tablet (20 mg total) by mouth at bedtime.  . metFORMIN (GLUCOPHAGE) 500 MG tablet Take 1 tablet (500 mg total) by mouth 2 (two) times daily.  Marland Kitchen omeprazole (PRILOSEC) 20 MG capsule Take 1 capsule (20 mg total) by mouth daily.  . phentermine (ADIPEX-P) 37.5 MG tablet Take 1 tablet (37.5 mg total) by mouth daily before breakfast.  . phentermine 37.5 MG capsule Take 1 capsule (37.5 mg total) by mouth every morning.  . [DISCONTINUED] cefdinir (OMNICEF) 300 MG capsule Take one capsule twice daily for 7 days  . [DISCONTINUED] chlorpheniramine-HYDROcodone (TUSSIONEX PENNKINETIC ER) 10-8 MG/5ML SUER Take 5 mLs by mouth every 12 (twelve)  hours as needed for cough.  . [DISCONTINUED] HYDROcodone-homatropine (HYCODAN) 5-1.5 MG/5ML syrup Take 5 mLs by mouth every 4 (four) hours as needed.  . cetirizine (ZYRTEC) 10 MG tablet Take 1 tablet (10 mg total) by mouth daily for 30 days.   No facility-administered encounter medications on file as of 03/08/2020.     Review of Systems  Constitutional: Negative.   HENT: Negative.   Eyes: Negative.   Respiratory: Negative.   Cardiovascular: Negative for chest pain, palpitations and leg swelling.       Reports increased heart rate with activity.  Gastrointestinal: Negative.   Endocrine: Negative.   Genitourinary: Positive for dysuria.       Took 2 leftover Cipro yesterday for uti (instructed her to never do this again)  Musculoskeletal: Negative.   Skin: Negative.   Allergic/Immunologic: Negative.   Neurological: Negative.   Hematological: Negative.   Psychiatric/Behavioral: Negative.      Vitals BP (!) 132/96   Pulse (!) 102   Temp 97.8 F (36.6 C)   Ht 5\' 1"  (1.549 m)   Wt 193 lb 6.4 oz (87.7 kg)   SpO2 97%   BMI 36.54 kg/m   Objective:   Physical Exam Vitals and nursing note reviewed.  Constitutional:      Appearance: Normal appearance.  Cardiovascular:     Rate and Rhythm: Regular rhythm. Tachycardia present.     Pulses: Normal pulses.     Heart sounds: Normal heart sounds.  Comments: Resting heart rate was 102 during exam. Pulmonary:     Effort: Pulmonary effort is normal.     Breath sounds: Normal breath sounds.  Abdominal:     Palpations: Abdomen is soft.  Genitourinary:    Comments: Deferred GU exam per patient request. Last pap 2 years ago- due next year. Musculoskeletal:        General: Normal range of motion.  Skin:    General: Skin is warm and dry.  Neurological:     General: No focal deficit present.     Mental Status: She is alert and oriented to person, place, and time.  Psychiatric:        Mood and Affect: Mood normal.         Behavior: Behavior normal.      Assessment and Plan   1. Routine general medical examination at a health care facility  2. Dysuria   Jessica [name] presents today for physical exam. Labs reviewed and lifestyle modifications discussed extensively. She is motivated to lose weight and try to prevent her pre-diabetes status from progressing. Her HgA1C this time is 6.0 (she is already on Metformin).   Reports dysuria and took 2 Cipro yesterday and AVO- unable to do urine dipstick in office due to AVO- urine sent for culture. Macrobid ordered due to symptoms and will call her if sensitivity shows she should be on a different antibiotic. Her symptoms are dysuria and suprapubic pain which is typical for her when she gets UTI.   She is concerned that her heart rate gets elevated when she exerts herself so she will keep a log with her heart rate and blood pressure (as her blood pressure was elevated today in office). Recheck by me was 128/92. She reports she is usually well managed with her current regimen- will make a decision after see some out of office readings.  She is due for her eye exam and will make this appointment. Sees dentist twice per year.  Has a skin tag on left upper thigh- instructed to make appointment with MD for removal. It is not bothering her nor does it look suspicious.   She will send me the heart rate and blood pressure readings through MyChart for review. Will make decision once we have more information. She understands that I Wilinski send her to see cardiology if her heart rate is consistently > 100 beats per minutes.    Jessica Ades, NP    03/08/2020

## 2020-03-10 LAB — URINE CULTURE: Organism ID, Bacteria: NO GROWTH

## 2020-03-10 LAB — SPECIMEN STATUS REPORT

## 2020-03-14 ENCOUNTER — Encounter: Payer: No Typology Code available for payment source | Admitting: Family Medicine

## 2020-04-08 NOTE — Telephone Encounter (Signed)
Called pt and scheduled appt. Advised urgent care or ED if having chest pain, sob or high fever. Pt states she is feeling ok and wants appt tomorrow.

## 2020-04-09 ENCOUNTER — Telehealth (INDEPENDENT_AMBULATORY_CARE_PROVIDER_SITE_OTHER): Payer: No Typology Code available for payment source | Admitting: Family Medicine

## 2020-04-09 ENCOUNTER — Telehealth: Payer: Self-pay | Admitting: *Deleted

## 2020-04-09 ENCOUNTER — Other Ambulatory Visit: Payer: Self-pay

## 2020-04-09 ENCOUNTER — Other Ambulatory Visit: Payer: Self-pay | Admitting: Family Medicine

## 2020-04-09 ENCOUNTER — Encounter: Payer: Self-pay | Admitting: Family Medicine

## 2020-04-09 DIAGNOSIS — U071 COVID-19: Secondary | ICD-10-CM | POA: Diagnosis not present

## 2020-04-09 MED ORDER — HYDROCODONE-HOMATROPINE 5-1.5 MG/5ML PO SYRP: 5.0000 mL | ORAL_SOLUTION | Freq: Three times a day (TID) | ORAL | 0 refills | Status: DC | PRN

## 2020-04-09 MED ORDER — BENZONATATE 100 MG PO CAPS: 100.0000 mg | ORAL_CAPSULE | Freq: Two times a day (BID) | ORAL | 0 refills | Status: DC | PRN

## 2020-04-09 MED ORDER — ONDANSETRON 4 MG PO TBDP: 4.0000 mg | ORAL_TABLET | Freq: Three times a day (TID) | ORAL | 0 refills | Status: DC | PRN

## 2020-04-09 NOTE — Progress Notes (Signed)
Patient ID: Jessica [name], female    DOB: 28-Jul-1970, 49 y.o.   MRN: [id number]   Chief Complaint  Patient presents with  . Covid Positive    Patient tested positive monday, symptoms started sunday. Patient reports severe cough, headaches and joint pain. Taking tylenol with no improvement. Reports congestion, fever off and on and chills.    Subjective:  CC: positive Covid test HPI: symptoms started Sunday, fever- T-max 101.7, joint aches, headache, and cough. Taking Tylenol for headache.    Medical History Jessica [name] has a past medical history of Anxiety, Cystitis, chronic, Gross hematuria, High cholesterol, Hypertension, and Obesity.   Outpatient Encounter Medications as of 04/09/2020  Medication Sig  . ALPRAZolam (XANAX) 0.5 MG tablet Take 1 tablet (0.5 mg total) by mouth daily as needed for anxiety.  Marland Kitchen atorvastatin (LIPITOR) 10 MG tablet Take 1 tablet (10 mg total) by mouth daily.  . benzonatate (TESSALON) 100 MG capsule Take 1 capsule (100 mg total) by mouth 2 (two) times daily as needed for cough.  . cetirizine (ZYRTEC) 10 MG tablet Take 1 tablet (10 mg total) by mouth daily for 30 days.  . citalopram (CELEXA) 20 MG tablet Take 1 tablet (20 mg total) by mouth daily.  Marland Kitchen lisinopril (ZESTRIL) 20 MG tablet Take 1 tablet (20 mg total) by mouth at bedtime.  . metFORMIN (GLUCOPHAGE) 500 MG tablet Take 1 tablet (500 mg total) by mouth 2 (two) times daily.  Marland Kitchen omeprazole (PRILOSEC) 20 MG capsule Take 1 capsule (20 mg total) by mouth daily.  . ondansetron (ZOFRAN-ODT) 4 MG disintegrating tablet Take 1 tablet (4 mg total) by mouth every 8 (eight) hours as needed for nausea or vomiting.  . [DISCONTINUED] phentermine (ADIPEX-P) 37.5 MG tablet Take 1 tablet (37.5 mg total) by mouth daily before breakfast.  . [DISCONTINUED] phentermine 37.5 MG capsule Take 1 capsule (37.5 mg total) by mouth every morning.   No facility-administered encounter medications on file as of 04/09/2020.     Review of Systems   Constitutional: Positive for appetite change and fever.  Respiratory: Positive for cough.   Musculoskeletal: Positive for myalgias.  Neurological: Positive for headaches.       Worse with cough.      Vitals There were no vitals taken for this visit.  This is a video visit.   Objective:   Physical Exam  Unable to assess. Able to converse with me through video visit. Assessment and Plan   1. COVID-19 virus infection - benzonatate (TESSALON) 100 MG capsule; Take 1 capsule (100 mg total) by mouth 2 (two) times daily as needed for cough.  Dispense: 20 capsule; Refill: 0 - ondansetron (ZOFRAN-ODT) 4 MG disintegrating tablet; Take 1 tablet (4 mg total) by mouth every 8 (eight) hours as needed for nausea or vomiting.  Dispense: 20 tablet; Refill: 0  Message from patient stating Tessalon does not work for her. Hycodan  5 mls every 8 hours as needed for cough.  Virtual Visit via Video Note  I connected with Jessica [name] on 04/09/20 at  2:30 PM EDT by a video enabled telemedicine application and verified that I am speaking with the correct person using two identifiers.  Location: Patient: home Provider: office   I discussed the limitations of evaluation and management by telemedicine and the availability of in person appointments. The patient expressed understanding and agreed to proceed.  History of Present Illness:    Observations/Objective:   Assessment and Plan: Symptoms began on Sunday. Received positive  test result yesterday. Having cough, fever, joint aches, and headache. Will treat nausea and cough for symptom relief. She will let us know if this does not help.   Follow Up Instructions:    I discussed the assessment and treatment plan with the patient. The patient was provided an opportunity to ask questions and all were answered. The patient agreed with the plan and demonstrated an understanding of the instructions.   The patient was advised to call back or seek an  in-person evaluation if the symptoms worsen or if the condition fails to improve as anticipated. You have tested positive for Covid-19.  The following symptoms Jessica [name] appear 2-14 days after exposure: Fever Cough Shortness of breath or difficulty breathing Chills Repeated shaking with chills Muscle pain Headache Sore throat New loss of taste or smell Fatigue Congestion or runny nose Nausea or vomiting Diarrhea    1) Get lots of rest.  2) Take over the counter pain medication if needed, such as acetaminophen or ibuprofen. Read and follow instructions on the label and make sure not to combine other medications that Jessica [name] in it. It is important to not take too much of these [name].  3) Drink plenty of caffeine-free fluids. (If you have heart or kidney problems, follow the instructions of your specialist regarding amounts).  4) If you are hungry, eat a bland diet, such as the BRAT diet (bananas, rice, applesauce, toast).  5) Let us know if these measures are not making you feel better.  6) Follow quarantine guidelines as instructed: 10 days from start of symptoms, as long as you are not running a fever and your symptoms are improving.   Agrees with plan of care discussed today. Understands warning signs to seek further care: chest pain or shortness of breath. Understands to follow-up if symptoms do not improve or if anything changes.     I provided 15 minutes of non-face-to-face time during this encounter.   Chalmers Guest, NP

## 2020-04-09 NOTE — Telephone Encounter (Signed)
Ms. Jessica [name], Jessica [name] are scheduled for a virtual visit with your provider today.    Just as we do with appointments in the office, we must obtain your consent to participate.  Your consent will be active for this visit and any virtual visit you Hendriks have with one of our providers in the next 365 days.    If you have a MyChart account, I can also send a copy of this consent to you electronically.  All virtual visits are billed to your insurance company just like a traditional visit in the office.  As this is a virtual visit, video technology does not allow for your provider to perform a traditional examination.  This Semple limit your provider's ability to fully assess your condition.  If your provider identifies any concerns that need to be evaluated in person or the need to arrange testing such as labs, EKG, etc, we will make arrangements to do so.    Although advances in technology are sophisticated, we cannot ensure that it will always work on either your end or our end.  If the connection with a video visit is poor, we Dorr have to switch to a telephone visit.  With either a video or telephone visit, we are not always able to ensure that we have a secure connection.   I need to obtain your verbal consent now.   Are you willing to proceed with your visit today?   Jessica [name] has provided verbal consent on 04/09/2020 for a virtual visit (video or telephone).   Patsy Lager, LPN 2/29/7989  2:11 AM

## 2020-04-14 ENCOUNTER — Other Ambulatory Visit (HOSPITAL_COMMUNITY): Payer: Self-pay | Admitting: Nurse Practitioner

## 2020-04-14 DIAGNOSIS — U071 COVID-19: Secondary | ICD-10-CM

## 2020-04-14 DIAGNOSIS — I10 Essential (primary) hypertension: Secondary | ICD-10-CM

## 2020-04-14 NOTE — Progress Notes (Signed)
I connected by phone with Jessica [name] on 04/14/2020 at 8:11 PM to discuss the potential use of a new treatment for mild to moderate COVID-19 viral infection in non-hospitalized patients.  This patient is a 49 y.o. female that meets the FDA criteria for Emergency Use Authorization of COVID monoclonal antibody casirivimab/imdevimab.  Has a (+) direct SARS-CoV-2 viral test result  Has mild or moderate COVID-19   Is NOT hospitalized due to COVID-19  Is within 10 days of symptom onset  Has at least one of the high risk factor(s) for progression to severe COVID-19 and/or hospitalization as defined in EUA.  Specific high risk criteria : BMI > 25 and Cardiovascular disease or hypertension   I have spoken and communicated the following to the patient or parent/caregiver regarding COVID monoclonal antibody treatment:  1. FDA has authorized the emergency use for the treatment of mild to moderate COVID-19 in adults and pediatric patients with positive results of direct SARS-CoV-2 viral testing who are 89 years of age and older weighing at least 40 kg, and who are at high risk for progressing to severe COVID-19 and/or hospitalization.  2. The significant known and potential risks and benefits of COVID monoclonal antibody, and the extent to which such potential risks and benefits are unknown.  3. Information on available alternative treatments and the risks and benefits of those alternatives, including clinical trials.  4. Patients treated with COVID monoclonal antibody should continue to self-isolate and use infection control measures (e.g., wear mask, isolate, social distance, avoid sharing personal items, clean and disinfect "high touch" surfaces, and frequent handwashing) according to CDC guidelines.   5. The patient or parent/caregiver has the option to accept or refuse COVID monoclonal antibody treatment.  After reviewing this information with the patient, The patient agreed to proceed with  receiving casirivimab\imdevimab infusion and will be provided a copy of the Fact sheet prior to receiving the infusion. Jessica [name] 04/14/2020 8:11 PM

## 2020-04-15 ENCOUNTER — Ambulatory Visit (HOSPITAL_COMMUNITY)
Admission: RE | Admit: 2020-04-15 | Discharge: 2020-04-15 | Disposition: A | Discharge status: Home / Self Care | Payer: No Typology Code available for payment source | Source: Ambulatory Visit | Attending: Pulmonary Disease | Admitting: Pulmonary Disease

## 2020-04-15 DIAGNOSIS — I10 Essential (primary) hypertension: Secondary | ICD-10-CM | POA: Insufficient documentation

## 2020-04-15 DIAGNOSIS — U071 COVID-19: Secondary | ICD-10-CM | POA: Insufficient documentation

## 2020-04-15 MED ORDER — ALBUTEROL SULFATE HFA 108 (90 BASE) MCG/ACT IN AERS: 2.0000 | INHALATION_SPRAY | Freq: Once | RESPIRATORY_TRACT | Status: DC | PRN

## 2020-04-15 MED ORDER — EPINEPHRINE 0.3 MG/0.3ML IJ SOAJ: 0.3000 mg | Freq: Once | INTRAMUSCULAR | Status: DC | PRN

## 2020-04-15 MED ORDER — METHYLPREDNISOLONE SODIUM SUCC 125 MG IJ SOLR: 125.0000 mg | Freq: Once | INTRAMUSCULAR | Status: DC | PRN

## 2020-04-15 MED ORDER — SODIUM CHLORIDE 0.9 % IV SOLN: INTRAVENOUS | Status: DC | PRN

## 2020-04-15 MED ORDER — SODIUM CHLORIDE 0.9 % IV SOLN
1200.0000 mg | Freq: Once | INTRAVENOUS | Status: AC
  Administered 2020-04-15: 1200 mg via INTRAVENOUS

## 2020-04-15 MED ORDER — FAMOTIDINE IN NACL 20-0.9 MG/50ML-% IV SOLN: 20.0000 mg | Freq: Once | INTRAVENOUS | Status: DC | PRN

## 2020-04-15 MED ORDER — DIPHENHYDRAMINE HCL 50 MG/ML IJ SOLN: 50.0000 mg | Freq: Once | INTRAMUSCULAR | Status: DC | PRN

## 2020-04-15 NOTE — Progress Notes (Signed)
  Diagnosis: COVID-19  Physician: Dr. Wright   Procedure: [facility] Med: casirivimab\imdevimab infusion - Provided patient with casirivimab\imdevimab fact sheet for patients, parents and caregivers prior to infusion.  Complications: No immediate complications noted.  Discharge: Discharged home   Joselinne Lawal  Bell 04/15/2020   

## 2020-04-15 NOTE — Discharge Instructions (Signed)

## 2020-04-16 ENCOUNTER — Telehealth: Payer: Self-pay | Admitting: Family Medicine

## 2020-04-16 DIAGNOSIS — Z029 Encounter for administrative examinations, unspecified: Secondary | ICD-10-CM

## 2020-04-16 NOTE — Telephone Encounter (Signed)
Patient had FMLA faxed over to be completed by 9/23. I explained to her to Santiago Glad is out of office and will try to get provider to complete papers. In your folder

## 2020-04-17 ENCOUNTER — Telehealth: Payer: Self-pay | Admitting: Family Medicine

## 2020-04-17 NOTE — Telephone Encounter (Signed)
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  She is suppose to return to work Thursday 23rd but still feels bad.  Patient said she has run fevers this week and is still coughing real bad.  Had the infusion on Monday.  She said that she has only had a virtual visit with Santiago Glad and wants her lungs listened to before she returns to work.  Said she feels pressured to return to work by [employer] at work" people.  Would like to have a car visit tomorrow (Thursday) to clear her to return to work on Friday. No appts available. Work in?

## 2020-04-17 NOTE — Telephone Encounter (Signed)
Finished, sent mychart to pt she can pick up today. Thx, Dr. Lovena Le

## 2020-04-18 NOTE — Telephone Encounter (Signed)
Spoke with Danae Chen video visit today at 11;10am or tomorrow car visit. Thx. Dr. Lovena Le

## 2020-04-19 ENCOUNTER — Ambulatory Visit (INDEPENDENT_AMBULATORY_CARE_PROVIDER_SITE_OTHER): Payer: No Typology Code available for payment source | Admitting: Family Medicine

## 2020-04-19 ENCOUNTER — Other Ambulatory Visit: Payer: Self-pay

## 2020-04-19 ENCOUNTER — Encounter: Payer: Self-pay | Admitting: Family Medicine

## 2020-04-19 DIAGNOSIS — U071 COVID-19: Secondary | ICD-10-CM | POA: Diagnosis not present

## 2020-04-19 MED ORDER — ALBUTEROL SULFATE HFA 108 (90 BASE) MCG/ACT IN AERS: 2.0000 | INHALATION_SPRAY | Freq: Four times a day (QID) | RESPIRATORY_TRACT | 0 refills | Status: DC | PRN

## 2020-04-19 NOTE — Progress Notes (Signed)
Patient ID: Jessica [name], female    DOB: 09-05-1970, 49 y.o.   MRN: [id number]   Chief Complaint  Patient presents with  . covid follow up    still having some cough and fatigue- needs forms for work-first day out of work 04/08/2020   Subjective:    HPI  F/u breathing from recent covid infection. Covid dx 04/08/20.  Feeling sluggish.  Still having coughing.  Had plasma infusion 4 days ago.  Getting short of breath occ.  Has been feeling improved, since infusion 4 days ago. Taking hycodan at night, prn. Pt is wanting to go to back to work, and her work stating needing to have her pcp release her. Pt is feeling better.  No h/o asthma or copd.   Xray from urgent care this week-  Per xray result in care everywhere CONCLUSION:   Subtle hazy opacities in the left lower lung favored to represent pneumonia with both atypical and typical etiologies in the differential.   Medical History Jessica [name] has a past medical history of Anxiety, Cystitis, chronic, Gross hematuria, High cholesterol, Hypertension, and Obesity.   Outpatient Encounter Medications as of 04/19/2020  Medication Sig  . albuterol (VENTOLIN HFA) 108 (90 Base) MCG/ACT inhaler Inhale 2 puffs into the lungs every 6 (six) hours as needed for wheezing or shortness of breath.  . ALPRAZolam (XANAX) 0.5 MG tablet Take 1 tablet (0.5 mg total) by mouth daily as needed for anxiety.  Marland Kitchen atorvastatin (LIPITOR) 10 MG tablet Take 1 tablet (10 mg total) by mouth daily.  . benzonatate (TESSALON) 100 MG capsule Take 1 capsule (100 mg total) by mouth 2 (two) times daily as needed for cough.  . cetirizine (ZYRTEC) 10 MG tablet Take 1 tablet (10 mg total) by mouth daily for 30 days.  . citalopram (CELEXA) 20 MG tablet Take 1 tablet (20 mg total) by mouth daily.  Marland Kitchen HYDROcodone-homatropine (HYCODAN) 5-1.5 MG/5ML syrup Take 5 mLs by mouth every 8 (eight) hours as needed for cough.  Marland Kitchen lisinopril (ZESTRIL) 20 MG tablet Take 1 tablet (20 mg total) by  mouth at bedtime.  . metFORMIN (GLUCOPHAGE) 500 MG tablet Take 1 tablet (500 mg total) by mouth 2 (two) times daily.  Marland Kitchen omeprazole (PRILOSEC) 20 MG capsule Take 1 capsule (20 mg total) by mouth daily.  . ondansetron (ZOFRAN-ODT) 4 MG disintegrating tablet Take 1 tablet (4 mg total) by mouth every 8 (eight) hours as needed for nausea or vomiting.   No facility-administered encounter medications on file as of 04/19/2020.     Review of Systems  Constitutional: Negative for chills and fever.  HENT: Negative for congestion, rhinorrhea and sore throat.   Respiratory: Positive for cough. Negative for shortness of breath and wheezing.   Cardiovascular: Negative for chest pain and leg swelling.  Gastrointestinal: Negative for abdominal pain, diarrhea, nausea and vomiting.  Genitourinary: Negative for dysuria and frequency.  Musculoskeletal: Negative for arthralgias and back pain.  Skin: Negative for rash.  Neurological: Negative for dizziness, weakness and headaches.     Vitals There were no vitals taken for this visit.  Objective:   Physical Exam Vitals and nursing note reviewed.  Constitutional:      General: She is not in acute distress.    Appearance: Normal appearance. She is not ill-appearing.  HENT:     Head: Normocephalic and atraumatic.     Right Ear: Tympanic membrane normal.     Left Ear: Tympanic membrane normal.     Nose: Nose  normal.     Mouth/Throat:     Mouth: Mucous membranes are moist.     Pharynx: Oropharynx is clear.  Eyes:     Extraocular Movements: Extraocular movements intact.     Conjunctiva/sclera: Conjunctivae normal.     Pupils: Pupils are equal, round, and reactive to light.  Cardiovascular:     Rate and Rhythm: Normal rate and regular rhythm.     Pulses: Normal pulses.     Heart sounds: Normal heart sounds.  Pulmonary:     Effort: Pulmonary effort is normal. No respiratory distress.     Breath sounds: Normal breath sounds. No wheezing, rhonchi or  rales.     Comments: +coughing at end expiration. Musculoskeletal:        General: Normal range of motion.     Right lower leg: No edema.     Left lower leg: No edema.  Skin:    General: Skin is warm and dry.     Findings: No rash.  Neurological:     General: No focal deficit present.     Mental Status: She is alert and oriented to person, place, and time.  Psychiatric:        Mood and Affect: Mood normal.        Behavior: Behavior normal.      Assessment and Plan   1. COVID-19 virus infection - albuterol (VENTOLIN HFA) 108 (90 Base) MCG/ACT inhaler; Inhale 2 puffs into the lungs every 6 (six) hours as needed for wheezing or shortness of breath.  Dispense: 18 g; Refill: 0   Pt improving, cont hydration and prn cough syrup.   Pt given fmla papers to return to work on 04/22/20. Gave pt albuterol prn to help with lingering coughing.  Pt stating had some abnromality on her xray, from urgent care, pt stating will get copy and send it to [place], if needing any follow up.  Pt treated with zpak and prednisone. Finishes prednisone tomorrow.  F/u prn.

## 2020-04-23 ENCOUNTER — Telehealth: Payer: Self-pay

## 2020-04-23 MED ORDER — CEFDINIR 300 MG PO CAPS
300.0000 mg | ORAL_CAPSULE | Freq: Two times a day (BID) | ORAL | 0 refills | Status: DC
Start: 2020-04-23 — End: 2020-09-02

## 2020-04-23 NOTE — Telephone Encounter (Signed)
Pt is needing Form for Keith regarding the Covid exception to be signed by Dr Lovena Le. Placed in her signature folder.

## 2020-04-24 NOTE — Telephone Encounter (Signed)
Hi, I don't have the form. Thx. Dr. Lovena Le

## 2020-04-24 NOTE — Telephone Encounter (Signed)
Completed forms. Thx. Dr. Lovena Le

## 2020-05-08 ENCOUNTER — Other Ambulatory Visit: Payer: Self-pay | Admitting: Family Medicine

## 2020-05-08 DIAGNOSIS — U071 COVID-19: Secondary | ICD-10-CM

## 2020-05-22 ENCOUNTER — Ambulatory Visit (INDEPENDENT_AMBULATORY_CARE_PROVIDER_SITE_OTHER): Payer: No Typology Code available for payment source

## 2020-05-22 ENCOUNTER — Other Ambulatory Visit: Payer: Self-pay

## 2020-05-22 DIAGNOSIS — Z23 Encounter for immunization: Secondary | ICD-10-CM | POA: Diagnosis not present

## 2020-06-10 ENCOUNTER — Ambulatory Visit: Payer: No Typology Code available for payment source | Admitting: Family Medicine

## 2020-07-04 ENCOUNTER — Other Ambulatory Visit: Payer: Self-pay

## 2020-07-04 ENCOUNTER — Other Ambulatory Visit: Payer: No Typology Code available for payment source

## 2020-07-04 DIAGNOSIS — Z20822 Contact with and (suspected) exposure to covid-19: Secondary | ICD-10-CM

## 2020-07-05 ENCOUNTER — Telehealth: Payer: No Typology Code available for payment source | Admitting: Nurse Practitioner

## 2020-07-05 DIAGNOSIS — K0889 Other specified disorders of teeth and supporting structures: Secondary | ICD-10-CM

## 2020-07-05 MED ORDER — NAPROXEN 500 MG PO TABS: 500.0000 mg | ORAL_TABLET | Freq: Two times a day (BID) | ORAL | 1 refills | Status: DC

## 2020-07-05 NOTE — Progress Notes (Signed)
E-Visit for Dental Pain  We are sorry that you are not feeling well.  Here is how we plan to help!  Based on what you have shared with me in the questionnaire, it sounds like you have filling that fell ot causing dental pain  naprosyn 500mg  2 times per day for 7 days for discomfort- this is the strongest thing I can do in an evisit.  It is imperative that you see a dentist within 10 days of this eVisit to determine the cause of the dental pain and be sure it is adequately treated  A toothache or tooth pain is caused when the nerve in the root of a tooth or surrounding a tooth is irritated. Dental (tooth) infection, decay, injury, or loss of a tooth are the most common causes of dental pain. Pain Nodarse also occur after an extraction (tooth is pulled out). Pain sometimes originates from other areas and radiates to the jaw, thus appearing to be tooth pain.Bacteria growing inside your mouth can contribute to gum disease and dental decay, both of which can cause pain. A toothache occurs from inflammation of the central portion of the tooth called pulp. The pulp contains nerve endings that are very sensitive to pain. Inflammation to the pulp or pulpitis Kurtzman be caused by dental cavities, trauma, and infection.    HOME CARE:   For toothaches: . Over-the-counter pain medications such as acetaminophen or ibuprofen Croker be used. Take these as directed on the package while you arrange for a dental appointment. . Avoid very cold or hot foods, because they Hodapp make the pain worse. . You Blomberg get relief from biting on a cotton ball soaked in oil of cloves. You can get oil of cloves at most drug stores.  For jaw pain: .  Aspirin Mowbray be helpful for problems in the joint of the jaw in adults. . If pain happens every time you open your mouth widely, the temporomandibular joint (TMJ) Blaszczyk be the source of the pain. Yawning or taking a large bite of food Boeh worsen the pain. An appointment with your doctor or dentist  will help you find the cause.     GET HELP RIGHT AWAY IF:  . You have a high fever or chills . If you have had a recent head or face injury and develop headache, light headedness, nausea, vomiting, or other symptoms that concern you after an injury to your face or mouth, you could have a more serious injury in addition to your dental injury. . A facial rash associated with a toothache: This condition Andres improve with medication. Contact your doctor for them to decide what is appropriate. . Any jaw pain occurring with chest pain: Although jaw pain is most commonly caused by dental disease, it is sometimes referred pain from other areas. People with heart disease, especially people who have had stents placed, people with diabetes, or those who have had heart surgery Luby have jaw pain as a symptom of heart attack or angina. If your jaw or tooth pain is associated with lightheadedness, sweating, or shortness of breath, you should see a doctor as soon as possible. . Trouble swallowing or excessive pain or bleeding from gums: If you have a history of a weakened immune system, diabetes, or steroid use, you Winning be more susceptible to infections. Infections can often be more severe and extensive or caused by unusual organisms. Dental and gum infections in people with these conditions Remmel require more aggressive treatment. An abscess Buscemi  need draining or IV antibiotics, for example.  MAKE SURE YOU    Understand these instructions.  Will watch your condition.  Will get help right away if you are not doing well or get worse.  Thank you for choosing an e-visit. Your e-visit answers were reviewed by a board certified advanced clinical practitioner to complete your personal care plan. Depending upon the condition, your plan could have included both over the counter or prescription medications. Please review your pharmacy choice. Make sure the pharmacy is open so you can pick up prescription now. If there is  a problem, you Delia contact your provider through [employer] and have the prescription routed to another pharmacy. Your safety is important to [place]. If you have drug allergies check your prescription carefully.  For the next 24 hours you can use MyChart to ask questions about today's visit, request a non-urgent call back, or ask for a work or school excuse. You will get an email in the next two days asking about your experience. I hope that your e-visit has been valuable and will speed your recovery.  5-10 minutes spent reviewing and documenting in chart.

## 2020-07-06 LAB — NOVEL CORONAVIRUS, NAA: SARS-CoV-2, NAA: NOT DETECTED

## 2020-07-06 LAB — SARS-COV-2, NAA 2 DAY TAT

## 2020-07-06 LAB — SPECIMEN STATUS REPORT

## 2020-07-07 DIAGNOSIS — I10 Essential (primary) hypertension: Secondary | ICD-10-CM

## 2020-07-07 DIAGNOSIS — Z13220 Encounter for screening for lipoid disorders: Secondary | ICD-10-CM

## 2020-07-07 DIAGNOSIS — Z79899 Other long term (current) drug therapy: Secondary | ICD-10-CM

## 2020-07-11 ENCOUNTER — Other Ambulatory Visit: Payer: No Typology Code available for payment source

## 2020-07-11 ENCOUNTER — Other Ambulatory Visit: Payer: Self-pay

## 2020-07-11 DIAGNOSIS — Z20822 Contact with and (suspected) exposure to covid-19: Secondary | ICD-10-CM

## 2020-07-12 LAB — NOVEL CORONAVIRUS, NAA: SARS-CoV-2, NAA: NOT DETECTED

## 2020-07-12 LAB — SARS-COV-2, NAA 2 DAY TAT

## 2020-07-12 LAB — SPECIMEN STATUS REPORT

## 2020-07-15 ENCOUNTER — Other Ambulatory Visit: Payer: Self-pay | Admitting: Family Medicine

## 2020-07-15 DIAGNOSIS — U071 COVID-19: Secondary | ICD-10-CM

## 2020-07-16 ENCOUNTER — Other Ambulatory Visit: Payer: No Typology Code available for payment source

## 2020-07-16 ENCOUNTER — Other Ambulatory Visit: Payer: Self-pay

## 2020-07-16 DIAGNOSIS — Z20822 Contact with and (suspected) exposure to covid-19: Secondary | ICD-10-CM

## 2020-07-18 LAB — NOVEL CORONAVIRUS, NAA: SARS-CoV-2, NAA: NOT DETECTED

## 2020-07-18 LAB — SPECIMEN STATUS REPORT

## 2020-07-18 LAB — SARS-COV-2, NAA 2 DAY TAT

## 2020-07-22 MED FILL — LISINOPRIL 20 MG TABLET: 20 | 90 days supply | Qty: 90 | Fill #1

## 2020-07-22 MED FILL — ATORVASTATIN CALCIUM 10 MG: 10 | 90 days supply | Qty: 90 | Fill #1

## 2020-07-22 MED FILL — CITALOPRAM HBR 20 MG TABLET: 20 | 90 days supply | Qty: 90 | Fill #1

## 2020-07-22 MED FILL — METFORMIN HCL 500 MG TABS: 500 | 90 days supply | Qty: 180 | Fill #1

## 2020-09-02 ENCOUNTER — Telehealth: Payer: No Typology Code available for payment source | Admitting: Family Medicine

## 2020-09-02 ENCOUNTER — Ambulatory Visit: Payer: No Typology Code available for payment source | Admitting: Family Medicine

## 2020-09-02 ENCOUNTER — Other Ambulatory Visit: Payer: Self-pay

## 2020-09-02 ENCOUNTER — Telehealth: Payer: Self-pay | Admitting: Family Medicine

## 2020-09-02 ENCOUNTER — Telehealth (INDEPENDENT_AMBULATORY_CARE_PROVIDER_SITE_OTHER): Payer: No Typology Code available for payment source | Admitting: Family Medicine

## 2020-09-02 DIAGNOSIS — J0110 Acute frontal sinusitis, unspecified: Secondary | ICD-10-CM | POA: Diagnosis not present

## 2020-09-02 MED ORDER — CHERATUSSIN AC 100-10 MG/5ML PO SOLN
5.0000 mL | Freq: Two times a day (BID) | ORAL | 0 refills | Status: DC | PRN
Start: 2020-09-02 — End: 2020-09-03

## 2020-09-02 MED ORDER — AZITHROMYCIN 250 MG PO TABS: ORAL_TABLET | ORAL | 0 refills | Status: DC

## 2020-09-02 NOTE — Telephone Encounter (Signed)
[name], ozga are scheduled for a virtual visit with your provider today.    Just as we do with appointments in the office, we must obtain your consent to participate.  Your consent will be active for this visit and any virtual visit you [place]ten have with one of our providers in the next 365 days.    If you have a MyChart account, I can also send a copy of this consent to you electronically.  All virtual visits are billed to your insurance company just like a traditional visit in the office.  As this is a virtual visit, video technology does not allow for your provider to perform a traditional examination.  This Schuermann limit your provider's ability to fully assess your condition.  If your provider identifies any concerns that need to be evaluated in person or the need to arrange testing such as labs, EKG, etc, we will make arrangements to do so.    Although advances in technology are sophisticated, we cannot ensure that it will always work on either your end or our end.  If the connection with a video visit is poor, we Verge have to switch to a telephone visit.  With either a video or telephone visit, we are not always able to ensure that we have a secure connection.   I need to obtain your verbal consent now.   Are you willing to proceed with your visit today?   Jessica [name] has provided verbal consent on 09/02/2020 for a virtual visit (video or telephone).   [name], LPN 08/31/971  5:32 PM

## 2020-09-02 NOTE — Progress Notes (Signed)
Patient ID: Jessica [name], female    DOB: 11/28/1970, 50 y.o.   MRN: [id number]   Virtual Visit via Telephone Note  I connected with Jessica [name] on 09/02/20 at  3:30 PM EST by telephone and verified that I am speaking with the correct person using two identifiers.  Location: Patient: home Provider: office   I discussed the limitations, risks, security and privacy concerns of performing an evaluation and management service by telephone and the availability of in person appointments. I also discussed with the patient that there Fillingim be a patient responsible charge related to this service. The patient expressed understanding and agreed to proceed.   Chief Complaint  Patient presents with  . 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 Pt thinking might be sinus infection vs. Ear infection.  Has rt ear pain. Having dry nagging coughing.  Stuffy nose then running alternating. No color to the drainage from nose.  No fever. Allegra and flonase.  Then taking mucinex now. Pt has allergy to pcn. In past used cefdinir and ceprozil.    Medical History Jessica [name] has a past medical history of Anxiety, Cystitis, chronic, Gross hematuria, High cholesterol, Hypertension, and Obesity.   Outpatient Encounter Medications as of 09/02/2020  Medication Sig  . albuterol (VENTOLIN HFA) 108 (90 Base) MCG/ACT inhaler INHALE 2 PUFFS INTO THE LUNGS EVERY 6 HOURS AS NEEDED FOR WHEEZING/SHORTNESS OF BREATH  . ALPRAZolam (XANAX) 0.5 MG tablet Take 1 tablet  (0.5 mg total) by mouth daily as needed for anxiety.  Marland Kitchen atorvastatin (LIPITOR) 10 MG tablet Take 1 tablet (10 mg total) by mouth daily.  Marland Kitchen azithromycin (ZITHROMAX Z-PAK) 250 MG tablet Take 2 tab p.o. day 1, then take 1 tab daily for 4 more days.  . citalopram (CELEXA) 20 MG tablet Take 1 tablet (20 mg total) by mouth daily.  Marland Kitchen lisinopril (ZESTRIL) 20 MG tablet Take 1 tablet (20 mg total) by mouth at bedtime.  . metFORMIN (GLUCOPHAGE) 500 MG tablet Take 1 tablet (500 mg total) by mouth 2 (two) times daily.  . naproxen (NAPROSYN) 500 MG tablet Take 1 tablet (500 mg total) by mouth 2 (two) times daily with a meal.  . omeprazole (PRILOSEC) 20 MG capsule Take 1 capsule (20 mg total) by mouth daily.  . ondansetron (ZOFRAN-ODT) 4 MG disintegrating tablet Take 1 tablet (4 mg total) by mouth every 8 (eight) hours as needed for nausea or vomiting.  . [DISCONTINUED] guaiFENesin-codeine (CHERATUSSIN AC) 100-10 MG/5ML syrup Take 5 mLs by mouth 2 (two) times daily as needed for cough.  . cetirizine (ZYRTEC) 10 MG tablet Take 1 tablet (10 mg total) by mouth daily for 30 days.  . [DISCONTINUED] benzonatate (TESSALON) 100 MG capsule Take 1 capsule (100 mg total) by mouth 2 (two) times daily as needed for cough.  . [DISCONTINUED] cefdinir (OMNICEF) 300 MG capsule Take 1 capsule (300 mg total) by mouth 2 (two) times daily.  . [DISCONTINUED] HYDROcodone-homatropine (HYCODAN) 5-1.5 MG/5ML syrup Take 5 mLs by mouth every 8 (eight) hours as needed for cough.  No facility-administered encounter medications on file as of 09/02/2020.     Review of Systems  Constitutional: Negative for chills and fever.  HENT: Positive for congestion, rhinorrhea, sinus pressure and sinus pain. Negative for sore throat.   Respiratory: Positive for cough. Negative for shortness of breath and wheezing.   Cardiovascular: Negative for chest pain and leg swelling.  Gastrointestinal: Negative for abdominal pain, diarrhea, nausea and vomiting.   Genitourinary: Negative for dysuria and frequency.  Musculoskeletal: Negative for arthralgias and back pain.  Skin: Negative for rash.  Neurological: Negative for dizziness, weakness and headaches.     Vitals There were no vitals taken for this visit.  Objective:   Physical Exam  No PE due to phone visit.  Assessment and Plan   1. Acute non-recurrent frontal sinusitis - azithromycin (ZITHROMAX Z-PAK) 250 MG tablet; Take 2 tab p.o. day 1, then take 1 tab daily for 4 more days.  Dispense: 6 each; Refill: 0    Pt had covid in 9/21.  Pt treated empirically for sinus infection.  Pt has allergy to pcn. Reviewed with pt viral vs bacterial illness. And that this Jessica [name] not get better with abx.  Pt voiced understanding.  Pt requesting cough syrup for severe cough at night.  Pt stating tessalon perles not helping in past. Gave azithromycin. Ordered cheratussin prn coughing. Pt to call or rto if not improving in the next 2-3 days. Pt in agreement.  F/u prn.  Follow Up Instructions:    I discussed the assessment and treatment plan with the patient. The patient was provided an opportunity to ask questions and all were answered. The patient agreed with the plan and demonstrated an understanding of the instructions.   The patient was advised to call back or seek an in-person evaluation if the symptoms worsen or if the condition fails to improve as anticipated.  I provided 15 minutes of non-face-to-face time during this encounter.

## 2020-09-03 MED ORDER — HYDROCODONE-HOMATROPINE 5-1.5 MG/5ML PO SYRP
5.0000 mL | ORAL_SOLUTION | Freq: Every evening | ORAL | 0 refills | Status: DC | PRN
Start: 2020-09-03 — End: 2020-09-04

## 2020-09-04 MED ORDER — HYDROCOD POLST-CPM POLST ER 10-8 MG/5ML PO SUER: 5.0000 mL | Freq: Two times a day (BID) | ORAL | 0 refills | Status: DC | PRN

## 2020-10-24 ENCOUNTER — Other Ambulatory Visit: Payer: Self-pay | Admitting: Family Medicine

## 2020-10-24 DIAGNOSIS — E7800 Pure hypercholesterolemia, unspecified: Secondary | ICD-10-CM

## 2020-10-24 MED ORDER — CITALOPRAM HYDROBROMIDE 20 MG PO TABS: 20.0000 mg | ORAL_TABLET | Freq: Every day | ORAL | 0 refills | Status: DC

## 2020-10-24 MED ORDER — METFORMIN HCL 500 MG PO TABS: 500.0000 mg | ORAL_TABLET | Freq: Two times a day (BID) | ORAL | 0 refills | Status: DC

## 2020-10-24 MED ORDER — LISINOPRIL 20 MG PO TABS: 20.0000 mg | ORAL_TABLET | Freq: Every day | ORAL | 0 refills | Status: DC

## 2020-10-24 MED ORDER — ATORVASTATIN CALCIUM 10 MG PO TABS: 10.0000 mg | ORAL_TABLET | Freq: Every day | ORAL | 0 refills | Status: DC

## 2020-10-24 NOTE — Telephone Encounter (Signed)
Patient is needing refill on lisinopril 20 mg,meformin 500 mg, atorvastatin 10 mg, citalopram 20 mg . She has appointment on 4/6.Sent to [place]

## 2020-10-28 ENCOUNTER — Other Ambulatory Visit (HOSPITAL_COMMUNITY): Payer: Self-pay

## 2020-10-28 MED FILL — Metformin HCl Tab 500 MG: ORAL | 90 days supply | Qty: 180 | Fill #0 | Status: AC

## 2020-10-28 MED FILL — Citalopram Hydrobromide Tab 20 MG (Base Equiv): ORAL | 30 days supply | Qty: 30 | Fill #0 | Status: AC

## 2020-10-28 MED FILL — Atorvastatin Calcium Tab 10 MG (Base Equivalent): ORAL | 30 days supply | Qty: 30 | Fill #0 | Status: AC

## 2020-10-28 MED FILL — Lisinopril Tab 20 MG: ORAL | 30 days supply | Qty: 30 | Fill #0 | Status: AC

## 2020-10-29 ENCOUNTER — Other Ambulatory Visit (HOSPITAL_COMMUNITY): Payer: Self-pay

## 2020-10-30 ENCOUNTER — Encounter: Payer: Self-pay | Admitting: Family Medicine

## 2020-10-30 ENCOUNTER — Ambulatory Visit: Payer: No Typology Code available for payment source | Admitting: Family Medicine

## 2020-10-30 ENCOUNTER — Other Ambulatory Visit (HOSPITAL_COMMUNITY): Payer: Self-pay

## 2020-11-01 ENCOUNTER — Other Ambulatory Visit: Payer: Self-pay

## 2020-11-01 ENCOUNTER — Other Ambulatory Visit: Payer: Self-pay | Admitting: Family Medicine

## 2020-11-01 ENCOUNTER — Ambulatory Visit
Admission: RE | Admit: 2020-11-01 | Discharge: 2020-11-01 | Disposition: A | Discharge status: Home / Self Care | Payer: No Typology Code available for payment source | Source: Ambulatory Visit | Attending: Family Medicine | Admitting: Family Medicine

## 2020-11-01 ENCOUNTER — Ambulatory Visit (INDEPENDENT_AMBULATORY_CARE_PROVIDER_SITE_OTHER): Payer: No Typology Code available for payment source | Admitting: Family Medicine

## 2020-11-01 ENCOUNTER — Other Ambulatory Visit (HOSPITAL_COMMUNITY): Payer: Self-pay

## 2020-11-01 VITALS — BP 130/92 | HR 90 | Temp 97.9°F | Ht 61.0 in | Wt 219.0 lb

## 2020-11-01 DIAGNOSIS — R7303 Prediabetes: Secondary | ICD-10-CM

## 2020-11-01 DIAGNOSIS — K219 Gastro-esophageal reflux disease without esophagitis: Secondary | ICD-10-CM

## 2020-11-01 DIAGNOSIS — I10 Essential (primary) hypertension: Secondary | ICD-10-CM

## 2020-11-01 DIAGNOSIS — R5383 Other fatigue: Secondary | ICD-10-CM | POA: Diagnosis not present

## 2020-11-01 DIAGNOSIS — F419 Anxiety disorder, unspecified: Secondary | ICD-10-CM

## 2020-11-01 DIAGNOSIS — G4700 Insomnia, unspecified: Secondary | ICD-10-CM

## 2020-11-01 DIAGNOSIS — E7800 Pure hypercholesterolemia, unspecified: Secondary | ICD-10-CM

## 2020-11-01 DIAGNOSIS — Z1231 Encounter for screening mammogram for malignant neoplasm of breast: Secondary | ICD-10-CM

## 2020-11-01 MED ORDER — CITALOPRAM HYDROBROMIDE 20 MG PO TABS
30.0000 mg | ORAL_TABLET | Freq: Every day | ORAL | 0 refills | Status: DC
  Filled 2020-11-01 – 2020-11-29 (×2): qty 135, 90d supply, fill #0

## 2020-11-01 MED ORDER — OMEPRAZOLE 20 MG PO CPDR
20.0000 mg | DELAYED_RELEASE_CAPSULE | Freq: Every day | ORAL | 1 refills | Status: DC
  Filled 2020-11-01 – 2020-11-29 (×2): qty 90, 90d supply, fill #0

## 2020-11-01 MED ORDER — LISINOPRIL 20 MG PO TABS
20.0000 mg | ORAL_TABLET | Freq: Every day | ORAL | 0 refills | Status: DC
  Filled 2020-11-01: qty 90, 90d supply, fill #0

## 2020-11-01 MED ORDER — LISINOPRIL 20 MG PO TABS
ORAL_TABLET | ORAL | 0 refills | Status: DC
  Filled 2020-11-01 – 2020-11-29 (×2): qty 135, 90d supply, fill #0

## 2020-11-01 MED ORDER — ATORVASTATIN CALCIUM 10 MG PO TABS
10.0000 mg | ORAL_TABLET | Freq: Every day | ORAL | 0 refills | Status: DC
  Filled 2020-11-01: qty 90, fill #0
  Filled 2020-11-29: qty 90, 90d supply, fill #0

## 2020-11-01 MED ORDER — METFORMIN HCL 500 MG PO TABS
500.0000 mg | ORAL_TABLET | Freq: Two times a day (BID) | ORAL | 0 refills | Status: DC
Start: 2020-11-01 — End: 2021-03-22
  Filled 2020-11-01: qty 180, 90d supply, fill #0

## 2020-11-01 NOTE — Progress Notes (Signed)
Patient ID: Jessica [name], female    DOB: 02/06/1971, 50 y.o.   MRN: [id number]   Chief Complaint  Patient presents with  . Diabetes    Medication refills 90 day supplies  . Hypertension    Follow up and refills    Subjective:    HPI F/u htn, prediabetes, HLD, and anxiety.  Pt hasn't had labs yet.  htn- Still a little high when having bp checked at other offices. Not adding salt to diet.  Taking lisinopril at night.  Pt only taking xanax 0.47m prn for severe anxiety or panic attack.  Still has some pills left. Taking celexa 2100mdaily. Still having some anxiety with sleep at night.  Waking up at night and not feeling like getting a full night of sleep. Not feeling that melatonin is working for her. Has been feeling she has increased in her weight and wanting to work on it.  Feeling more fatigued and tired and can "fall asleep anywhere." has been told she snores by husband.  Not had apnea that she is aware of.    Medical History Be[place]arylas a past medical history of Anxiety, Cystitis, chronic, Gross hematuria, High cholesterol, Hypertension, and Obesity.   Outpatient Encounter Medications as of 11/01/2020  Medication Sig  . ALPRAZolam (XANAX) 0.5 MG tablet Take 1 tablet (0.5 mg total) by mouth daily as needed for anxiety.  . Marland Kitchentorvastatin (LIPITOR) 10 MG tablet TAKE 1 TABLET (10 MG TOTAL) BY MOUTH DAILY.  . cetirizine (ZYRTEC) 10 MG tablet Take 1 tablet (10 mg total) by mouth daily for 30 days.  . citalopram (CELEXA) 20 MG tablet Take 1.5 tablets (30 mg total) by mouth daily.  . Marland Kitchenisinopril (ZESTRIL) 20 MG tablet Take 1/2 tablet by mouth every morning and 1 tablet by mouth at bedtime  . metFORMIN (GLUCOPHAGE) 500 MG tablet TAKE 1 TABLET (500 MG TOTAL) BY MOUTH 2 (TWO) TIMES DAILY.  . Marland Kitchenmeprazole (PRILOSEC) 20 MG capsule Take 1 capsule (20 mg total) by mouth daily.  . [DISCONTINUED] albuterol (VENTOLIN HFA) 108 (90 Base) MCG/ACT inhaler INHALE 2 PUFFS INTO THE LUNGS EVERY 6 HOURS  AS NEEDED FOR WHEEZING/SHORTNESS OF BREATH  . [DISCONTINUED] atorvastatin (LIPITOR) 10 MG tablet TAKE 1 TABLET (10 MG TOTAL) BY MOUTH DAILY.  . [DISCONTINUED] azithromycin (ZITHROMAX Z-PAK) 250 MG tablet Take 2 tab p.o. day 1, then take 1 tab daily for 4 more days.  . [DISCONTINUED] chlorpheniramine-HYDROcodone (TUSSIONEX PENNKINETIC ER) 10-8 MG/5ML SUER Take 5 mLs by mouth every 12 (twelve) hours as needed for cough.  . [DISCONTINUED] citalopram (CELEXA) 20 MG tablet TAKE 1 TABLET (20 MG TOTAL) BY MOUTH DAILY.  . [DISCONTINUED] lisinopril (ZESTRIL) 20 MG tablet TAKE 1 TABLET (20 MG TOTAL) BY MOUTH AT BEDTIME. NEEDS APPT.  . [DISCONTINUED] lisinopril (ZESTRIL) 20 MG tablet Take 1 tablet (20 mg total) by mouth at bedtime.  . [DISCONTINUED] metFORMIN (GLUCOPHAGE) 500 MG tablet TAKE 1 TABLET (500 MG TOTAL) BY MOUTH 2 (TWO) TIMES DAILY.  . [DISCONTINUED] naproxen (NAPROSYN) 500 MG tablet Take 1 tablet (500 mg total) by mouth 2 (two) times daily with a meal.  . [DISCONTINUED] omeprazole (PRILOSEC) 20 MG capsule Take 1 capsule (20 mg total) by mouth daily.  . [DISCONTINUED] ondansetron (ZOFRAN-ODT) 4 MG disintegrating tablet Take 1 tablet (4 mg total) by mouth every 8 (eight) hours as needed for nausea or vomiting.   No facility-administered encounter medications on file as of 11/01/2020.     Review of Systems  Constitutional: Positive  for fatigue. Negative for chills and fever.  HENT: Negative for congestion, rhinorrhea and sore throat.   Respiratory: Negative for cough, shortness of breath and wheezing.   Cardiovascular: Negative for chest pain and leg swelling.  Gastrointestinal: Negative for abdominal pain, diarrhea, nausea and vomiting.  Genitourinary: Negative for dysuria and frequency.  Musculoskeletal: Negative for arthralgias and back pain.  Skin: Negative for rash.  Neurological: Negative for dizziness, weakness and headaches.  Psychiatric/Behavioral: Positive for sleep disturbance.  Negative for dysphoric mood, self-injury and suicidal ideas. The patient is nervous/anxious.      Vitals BP (!) 130/92   Pulse 90   Temp 97.9 F (36.6 C)   Ht '5\' 1"'  (1.549 m)   Wt 219 lb (99.3 kg)   SpO2 97%   BMI 41.38 kg/m   Objective:   Physical Exam Vitals and nursing note reviewed.  Constitutional:      General: She is not in acute distress.    Appearance: Normal appearance. She is not ill-appearing.  HENT:     Head: Normocephalic and atraumatic.  Eyes:     Extraocular Movements: Extraocular movements intact.     Conjunctiva/sclera: Conjunctivae normal.     Pupils: Pupils are equal, round, and reactive to light.  Cardiovascular:     Rate and Rhythm: Normal rate and regular rhythm.     Pulses: Normal pulses.     Heart sounds: Normal heart sounds.  Pulmonary:     Effort: Pulmonary effort is normal. No respiratory distress.     Breath sounds: Normal breath sounds. No wheezing, rhonchi or rales.  Musculoskeletal:        General: Normal range of motion.     Right lower leg: No edema.     Left lower leg: No edema.  Skin:    General: Skin is warm and dry.     Findings: No lesion or rash.  Neurological:     General: No focal deficit present.     Mental Status: She is alert and oriented to person, place, and time.     Cranial Nerves: No cranial nerve deficit.     Gait: Gait normal.  Psychiatric:        Mood and Affect: Mood normal.        Behavior: Behavior normal.      Assessment and Plan   1. Prediabetes - metFORMIN (GLUCOPHAGE) 500 MG tablet; TAKE 1 TABLET (500 MG TOTAL) BY MOUTH 2 (TWO) TIMES DAILY.  Dispense: 180 tablet; Refill: 0 - CMP14+EGFR - Microalbumin, urine - Hemoglobin A1c  2. Pure hypercholesterolemia - atorvastatin (LIPITOR) 10 MG tablet; TAKE 1 TABLET (10 MG TOTAL) BY MOUTH DAILY.  Dispense: 90 tablet; Refill: 0 - CBC - Lipid panel  3. Other fatigue - TSH  4. Anxiety - citalopram (CELEXA) 20 MG tablet; Take 1.5 tablets (30 mg total)  by mouth daily.  Dispense: 135 tablet; Refill: 0  5. Essential hypertension - lisinopril (ZESTRIL) 20 MG tablet; Take 1/2 tablet by mouth every morning and 1 tablet by mouth at bedtime  Dispense: 135 tablet; Refill: 0  6. Insomnia, unspecified type - citalopram (CELEXA) 20 MG tablet; Take 1.5 tablets (30 mg total) by mouth daily.  Dispense: 135 tablet; Refill: 0  7. Gastroesophageal reflux disease without esophagitis - omeprazole (PRILOSEC) 20 MG capsule; Take 1 capsule (20 mg total) by mouth daily.  Dispense: 90 capsule; Refill: 1  8. Morbid obesity ([place])   htn- suboptimal. pt to increase lisinopril from 86m to 363mper day.  Pt to take 52m in am and 241mat night.  Anxiety/insomnia- suboptimal.  Advising increase celexa from 2020mo 5m59m see if will help with sleep and increase in exercising to help with anxiety.    Prediabetes- unknown, will recheck labs.  taking metformin. Last a1c was 6.0. Hasn't had an eye exam. No foot issues.  gerd- stable. Cont meds.  Fatigue- ordered TSH, cbc, and cmp.  If labs are normal and anxiety medications not helping with sleep would recommend sleep study.  HLD- unknown, will order labs.  Cont meds.  Obesity- encouraged healthy diet and increase in exercising.  Return in about 6 months (around 05/03/2021) for f/u prediabetes, htn, hld, fatigue.

## 2020-11-05 ENCOUNTER — Other Ambulatory Visit (HOSPITAL_COMMUNITY): Payer: Self-pay

## 2020-11-08 ENCOUNTER — Telehealth: Payer: No Typology Code available for payment source | Admitting: Physician Assistant

## 2020-11-08 DIAGNOSIS — J0140 Acute pansinusitis, unspecified: Secondary | ICD-10-CM

## 2020-11-08 MED ORDER — AZELASTINE HCL 0.1 % NA SOLN: 2.0000 | Freq: Two times a day (BID) | NASAL | 0 refills | Status: DC

## 2020-11-08 MED ORDER — AZITHROMYCIN 250 MG PO TABS: ORAL_TABLET | ORAL | 0 refills | Status: DC

## 2020-11-08 NOTE — Progress Notes (Signed)
We are sorry that you are not feeling well.  Here is how we plan to help!  Based on what you have shared with me it looks like you have sinusitis.  Sinusitis is inflammation and infection in the sinus cavities of the head.  Based on your presentation I believe you most likely have Acute Viral Sinusitis.This is an infection most likely caused by a virus. There is not specific treatment for viral sinusitis other than to help you with the symptoms until the infection runs its course.  You Ch[place]ien use an oral decongestant such as Mucinex D or if you have glaucoma or high blood pressure use plain Mucinex. Saline nasal spray help and can safely be used as often as needed for congestion, I have prescribed: Azelastine nasal spray 2 sprays in each nostril twice a day  Some authorities believe that zinc sprays or the use of Echinacea [place] shorten the course of your symptoms.  Sinus infections are not as easily transmitted as other respiratory infection, however we still recommend that you avoid close contact with loved ones, especially the very young and elderly.  Remember to wash your hands thoroughly throughout the day as this is the number one way to prevent the spread of infection!  Home Care:  Only take medications as instructed by your medical team.  Do not take these medications with alcohol.  A steam or ultrasonic humidifier can help congestion.  You can place a towel over your head and breathe in the steam from hot water coming from a faucet.  Avoid close contacts especially the very young and the elderly.  Cover your mouth when you cough or sneeze.  Always remember to wash your hands.  Get Help Right Away If:  You develop worsening fever or sinus pain.  You develop a severe head ache or visual changes.  Your symptoms persist after you have completed your treatment plan.  Make sure you  Understand these instructions.  Will watch your condition.  Will get help right away if you are not  doing well or get worse.  Your e-visit answers were reviewed by a board certified advanced clinical practitioner to complete your personal care plan.  Depending on the condition, your plan could have included both over the counter or prescription medications.  If there is a problem please reply  once you have received a response from your provider.  Your safety is important to [place].  If you have drug allergies check your prescription carefully.    You can use MyChart to ask questions about today's visit, request a non-urgent call back, or ask for a work or school excuse for 24 hours related to this e-Visit. If it has been greater than 24 hours you will need to follow up with your provider, or enter a new e-Visit to address those concerns.  You will get an e-mail in the next two days asking about your experience.  I hope that your e-visit has been valuable and will speed your recovery. Thank you for using e-visits.  I provided 5 minutes of non face-to-face time during this encounter for chart review and documentation.

## 2020-11-08 NOTE — Addendum Note (Signed)
Addended by: Mar Daring on: 11/08/2020 03:44 PM   Modules accepted: Orders

## 2020-11-12 ENCOUNTER — Other Ambulatory Visit (HOSPITAL_COMMUNITY): Payer: Self-pay

## 2020-11-21 ENCOUNTER — Telehealth: Payer: Self-pay | Admitting: Family Medicine

## 2020-11-21 NOTE — Telephone Encounter (Signed)
Patient had urine tested at work and had blood in it and they told her to call her primary doctor to get order sent to [facility] for a culture. She want my chart message to sent her when its sent to [facility]

## 2020-11-21 NOTE — Telephone Encounter (Signed)
Pt contacted. Pt wanted provider to send in urine culture. Pt would like it added to previous labs from 11/01/20. Pt advised that she would need appt.  Pt offered 2 appts for today. Pt unable to make them due to being short staffed at work. Pt also not having any urinary symptoms. Spoke with provider. Provider states that since no urinary symptoms we would not send for culture. Pt asked when Dr.Taylor was leaving, nurse advised September. Informed pt that we would not be able to take on any new patients since Dr.Scott was full. Pt asked would she need to find a new doctor. Pt advised yes she would need to find a new doctor. Pt told nurse to tell Dr.Taylor "thanks for nothing"

## 2020-11-21 NOTE — Telephone Encounter (Signed)
Pt not seen here for urine testing.  Needs appt.  Sorry for inconvenience.   Dr. Lovena Le

## 2020-11-22 LAB — CBC
Hematocrit: 35.9 % (ref 34.0–46.6)
Hemoglobin: 12.3 g/dL (ref 11.1–15.9)
MCH: 30 pg (ref 26.6–33.0)
MCHC: 34.3 g/dL (ref 31.5–35.7)
MCV: 88 fL (ref 79–97)
Platelets: 285 10*3/uL (ref 150–450)
RBC: 4.1 x10E6/uL (ref 3.77–5.28)
RDW: 13.2 % (ref 11.7–15.4)
WBC: 8.9 10*3/uL (ref 3.4–10.8)

## 2020-11-22 LAB — LIPID PANEL
Chol/HDL Ratio: 3.5 ratio (ref 0.0–4.4)
Cholesterol, Total: 151 mg/dL (ref 100–199)
HDL: 43 mg/dL (ref >39)
LDL Chol Calc (NIH): 90 mg/dL (ref 0–99)
Triglycerides: 94 mg/dL (ref 0–149)
VLDL Cholesterol Cal: 18 mg/dL (ref 5–40)

## 2020-11-22 LAB — CMP14+EGFR
ALT: 18 IU/L (ref 0–32)
AST: 18 IU/L (ref 0–40)
Albumin/Globulin Ratio: 1.9 (ref 1.2–2.2)
Albumin: 4.5 g/dL (ref 3.8–4.8)
Alkaline Phosphatase: 90 IU/L (ref 44–121)
BUN/Creatinine Ratio: 15 (ref 9–23)
BUN: 11 mg/dL (ref 6–24)
Bilirubin Total: 0.3 mg/dL (ref 0.0–1.2)
CO2: 20 mmol/L (ref 20–29)
Calcium: 9.4 mg/dL (ref 8.7–10.2)
Chloride: 104 mmol/L (ref 96–106)
Creatinine, Ser: 0.73 mg/dL (ref 0.57–1.00)
Globulin, Total: 2.4 g/dL (ref 1.5–4.5)
Glucose: 115 mg/dL — ABNORMAL HIGH (ref 65–99)
Potassium: 4.4 mmol/L (ref 3.5–5.2)
Sodium: 142 mmol/L (ref 134–144)
Total Protein: 6.9 g/dL (ref 6.0–8.5)
eGFR: 102 mL/min/{1.73_m2} (ref >59)

## 2020-11-22 LAB — HEMOGLOBIN A1C
Est. average glucose Bld gHb Est-mCnc: 137 mg/dL
Hgb A1c MFr Bld: 6.4 % — ABNORMAL HIGH (ref 4.8–5.6)

## 2020-11-22 LAB — TSH: TSH: 1.6 u[IU]/mL (ref 0.450–4.500)

## 2020-11-22 LAB — MICROALBUMIN, URINE: Microalbumin, Urine: 15.4 ug/mL

## 2020-11-26 ENCOUNTER — Other Ambulatory Visit: Payer: Self-pay

## 2020-11-26 ENCOUNTER — Encounter: Payer: Self-pay | Admitting: Family Medicine

## 2020-11-26 ENCOUNTER — Ambulatory Visit (INDEPENDENT_AMBULATORY_CARE_PROVIDER_SITE_OTHER): Payer: No Typology Code available for payment source | Admitting: Family Medicine

## 2020-11-26 VITALS — BP 138/96 | HR 105 | Temp 97.5°F | Ht 61.0 in | Wt 216.0 lb

## 2020-11-26 DIAGNOSIS — R519 Headache, unspecified: Secondary | ICD-10-CM | POA: Diagnosis not present

## 2020-11-26 DIAGNOSIS — S0030XA Unspecified superficial injury of nose, initial encounter: Secondary | ICD-10-CM

## 2020-11-26 MED ORDER — IBUPROFEN 600 MG PO TABS: 600.0000 mg | ORAL_TABLET | Freq: Four times a day (QID) | ORAL | 0 refills | Status: DC | PRN

## 2020-11-26 NOTE — Progress Notes (Signed)
Patient ID: Jessica [name], female    DOB: 1970-11-19, 50 y.o.   MRN: [id number]   Chief Complaint  Patient presents with  . Epistaxis    Injury to nose 2 days ago, headaches since sunday   Subjective:  CC: vent hit nose on Sunday  This is a new problem.  Presents today for an acute visit with a complaint of her to nose.  Reports that on Sunday she was changing the filter in the overhead event, and it fell onto her nose.  Injury is superficial, nose did not bleed from inside, just bled from the superficial outside injury.  Reports that she has had a constant headache since that time.  Has taken Tylenol with minimal relief.  Denies fever, chills, chest pain, shortness of breath.  Endorses headache since injury, no dizziness no lightheadedness.  Did not lose consciousness, there is no obvious deformity to the nose.    Medical History Jessica [name] has a past medical history of Anxiety, Cystitis, chronic, Gross hematuria, High cholesterol, Hypertension, and Obesity.   Outpatient Encounter Medications as of 11/26/2020  Medication Sig  . ibuprofen (ADVIL) 600 MG tablet Take 1 tablet (600 mg total) by mouth every 6 (six) hours as needed.  . ALPRAZolam (XANAX) 0.5 MG tablet Take 1 tablet (0.5 mg total) by mouth daily as needed for anxiety.  Marland Kitchen atorvastatin (LIPITOR) 10 MG tablet TAKE 1 TABLET (10 MG TOTAL) BY MOUTH DAILY.  Marland Kitchen azelastine (ASTELIN) 0.1 % nasal spray Place 2 sprays into both nostrils 2 (two) times daily. Use in each nostril as directed  . azithromycin (ZITHROMAX) 250 MG tablet Take 2 tablets PO on day one, and one tablet PO daily thereafter until completed.  . cetirizine (ZYRTEC) 10 MG tablet Take 1 tablet (10 mg total) by mouth daily for 30 days.  . citalopram (CELEXA) 20 MG tablet Take 1.5 tablets (30 mg total) by mouth daily.  Marland Kitchen lisinopril (ZESTRIL) 20 MG tablet Take 1/2 tablet by mouth every morning and 1 tablet by mouth at bedtime  . metFORMIN (GLUCOPHAGE) 500 MG tablet TAKE 1 TABLET (500  MG TOTAL) BY MOUTH 2 (TWO) TIMES DAILY.  Marland Kitchen omeprazole (PRILOSEC) 20 MG capsule Take 1 capsule (20 mg total) by mouth daily.   No facility-administered encounter medications on file as of 11/26/2020.     Review of Systems  Constitutional: Negative for chills and fever.  HENT: Negative for nosebleeds.   Respiratory: Negative for shortness of breath.   Cardiovascular: Negative for chest pain.  Gastrointestinal: Negative for abdominal pain.  Neurological: Positive for headaches. Negative for dizziness and light-headedness.       Constant headache since nose injury on Sunday.      Vitals BP (!) 138/96   Pulse (!) 105   Temp (!) 97.5 F (36.4 C)   Ht 5\' 1"  (1.549 m)   Wt 216 lb (98 kg)   SpO2 98%   BMI 40.81 kg/m   Objective:   Physical Exam HENT:     Nose: Signs of injury and nasal tenderness present. No nasal deformity or septal deviation.     Right Nostril: No epistaxis, septal hematoma or occlusion.     Left Nostril: No epistaxis, septal hematoma or occlusion.     Right Turbinates: Swollen.     Left Turbinates: Swollen.     Right Sinus: No maxillary sinus tenderness or frontal sinus tenderness.     Left Sinus: No maxillary sinus tenderness or frontal sinus tenderness.  Comments: Superficial skin injury appears to be healing.  No obvious deformity noted no bleeding.  Area tender upon palpation. No swelling or ecchymosis.      Assessment and Plan   1. Superficial injury of nose, initial encounter - ibuprofen (ADVIL) 600 MG tablet; Take 1 tablet (600 mg total) by mouth every 6 (six) hours as needed.  Dispense: 12 tablet; Refill: 0   No obvious deformity, superficial injury, no swelling,  no bleeding from inside nose at the time of injury.  Will prescribe ibuprofen 600 mg every 6 hours for the next 3 to 4 days for headache (tolerates ibuprofen well).  Recommend padding her glasses to avoid hitting this area.  She is unsure of her last tetanus vaccination, she will check  with health at work and let us know, if she has not had tetanus in the last 5 years.  If needed she will stop by for a nurse visit for tetanus . Agrees with plan of care discussed today. Understands warning signs to seek further care: chest pain, shortness of breath, any significant change in health.  Understands to follow-up if headache does not resolve, next steps is facial x-ray to rule out fracture, low suspicion of nasal fracture.  No swelling, no obvious deformity.   Jessica Ades, NP 11/26/2020

## 2020-11-26 NOTE — Patient Instructions (Signed)
You will let us know about Tdap if not in last 5 years, make nurse visit.

## 2020-11-29 ENCOUNTER — Other Ambulatory Visit (HOSPITAL_COMMUNITY): Payer: Self-pay

## 2020-12-13 ENCOUNTER — Ambulatory Visit: Payer: No Typology Code available for payment source | Admitting: Medical

## 2020-12-25 ENCOUNTER — Telehealth: Payer: No Typology Code available for payment source | Admitting: Physician Assistant

## 2020-12-25 DIAGNOSIS — B9689 Other specified bacterial agents as the cause of diseases classified elsewhere: Secondary | ICD-10-CM

## 2020-12-25 DIAGNOSIS — J019 Acute sinusitis, unspecified: Secondary | ICD-10-CM

## 2020-12-25 MED ORDER — DOXYCYCLINE HYCLATE 100 MG PO CAPS: 100.0000 mg | ORAL_CAPSULE | Freq: Two times a day (BID) | ORAL | 0 refills | Status: DC

## 2020-12-25 NOTE — Progress Notes (Signed)
I have spent 5 minutes in review of e-visit questionnaire, review and updating patient chart, medical decision making and response to patient.   Eliyanna Ault Cody Tredarius Cobern, PA-C    

## 2020-12-25 NOTE — Progress Notes (Signed)
We are sorry that you are not feeling well.  Here is how we plan to help!  Based on what you have shared with me it looks like you have sinusitis.  Sinusitis is inflammation and infection in the sinus cavities of the head.  Based on your presentation I believe you most likely have Acute Bacterial Sinusitis.  This is an infection caused by bacteria and is treated with antibiotics. I have prescribed Doxycycline 100mg by mouth twice a day for 10 days. [name] use an oral decongestant such as Mucinex D or if you have glaucoma or high blood pressure use plain Mucinex. Saline nasal spray help and can safely be used as often as needed for congestion.  If you develop worsening sinus pain, fever or notice severe headache and vision changes, or if symptoms are not better after completion of antibiotic, please schedule an appointment with a health care provider.    Sinus infections are not as easily transmitted as other respiratory infection, however we still recommend that you avoid close contact with loved ones, especially the very young and elderly.  Remember to wash your hands thoroughly throughout the day as this is the number one way to prevent the spread of infection!  Home Care:  Only take medications as instructed by your medical team.  Complete the entire course of an antibiotic.  Do not take these medications with alcohol.  A steam or ultrasonic humidifier can help congestion.  You can place a towel over your head and breathe in the steam from hot water coming from a faucet.  Avoid close contacts especially the very young and the elderly.  Cover your mouth when you cough or sneeze.  Always remember to wash your hands.  Get Help Right Away If:  You develop worsening fever or sinus pain.  You develop a severe head ache or visual changes.  Your symptoms persist after you have completed your treatment plan.  Make sure you  Understand these instructions.  Will watch your  condition.  Will get help right away if you are not doing well or get worse.  Your e-visit answers were reviewed by a board certified advanced clinical practitioner to complete your personal care plan.  Depending on the condition, your plan could have included both over the counter or prescription medications.  If there is a problem please reply  once you have received a response from your provider.  Your safety is important to us.  If you have drug allergies check your prescription carefully.    You can use MyChart to ask questions about today's visit, request a non-urgent call back, or ask for a work or school excuse for 24 hours related to this e-Visit. If it has been greater than 24 hours you will need to follow up with your provider, or enter a new e-Visit to address those concerns.  You will get an e-mail in the next two days asking about your experience.  I hope that your e-visit has been valuable and will speed your recovery. Thank you for using e-visits.    

## 2020-12-26 ENCOUNTER — Other Ambulatory Visit: Payer: Self-pay

## 2020-12-26 DIAGNOSIS — J069 Acute upper respiratory infection, unspecified: Secondary | ICD-10-CM

## 2020-12-29 ENCOUNTER — Telehealth: Payer: No Typology Code available for payment source | Admitting: Family

## 2020-12-29 DIAGNOSIS — J069 Acute upper respiratory infection, unspecified: Secondary | ICD-10-CM

## 2020-12-29 MED ORDER — FLUTICASONE PROPIONATE 50 MCG/ACT NA SUSP: 2.0000 | Freq: Every day | NASAL | 6 refills | Status: DC

## 2020-12-29 MED ORDER — BENZONATATE 100 MG PO CAPS
100.0000 mg | ORAL_CAPSULE | Freq: Three times a day (TID) | ORAL | 0 refills | Status: DC | PRN
Start: 2020-12-29 — End: 2021-01-03

## 2020-12-29 NOTE — Progress Notes (Signed)
We are sorry you are not feeling well.  Here is how we plan to help!  Based on what you have shared with me, it looks like you Kolander have a viral upper respiratory infection.  Upper respiratory infections are caused by a large number of viruses; however, rhinovirus is the most common cause.   Symptoms vary from person to person, with common symptoms including sore throat, cough, fatigue or lack of energy and feeling of general discomfort.  A low-grade fever of up to 100.4 Maes present, but is often uncommon.  Symptoms vary however, and are closely related to a person's age or underlying illnesses.  The most common symptoms associated with an upper respiratory infection are nasal discharge or congestion, cough, sneezing, headache and pressure in the ears and face.  These symptoms usually persist for about 3 to 10 days, but can last up to 2 weeks.  It is important to know that upper respiratory infections do not cause serious illness or complications in most cases.    Upper respiratory infections can be transmitted from person to person, with the most common method of transmission being a person's hands.  The virus is able to live on the skin and can infect other persons for up to 2 hours after direct contact.  Also, these can be transmitted when someone coughs or sneezes; thus, it is important to cover the mouth to reduce this risk.  To keep the spread of the illness at bay, good hand hygiene is very important.  This is an infection that is most likely caused by a virus. There are no specific treatments other than to help you with the symptoms until the infection runs its course.  We are sorry you are not feeling well.  Here is how we plan to help!   For nasal congestion, you Newton use an oral decongestants such as Mucinex D or if you have glaucoma or high blood pressure use plain Mucinex.  Saline nasal spray or nasal drops can help and can safely be used as often as needed for congestion.  For your congestion,  I have prescribed Fluticasone nasal spray one spray in each nostril twice a day  If you do not have a history of heart disease, hypertension, diabetes or thyroid disease, prostate/bladder issues or glaucoma, you Blystone also use Sudafed to treat nasal congestion.  It is highly recommended that you consult with a pharmacist or your primary care physician to ensure this medication is safe for you to take.     If you have a cough, you Velarde use cough suppressants such as Delsym and Robitussin.  If you have glaucoma or high blood pressure, you can also use Coricidin HBP.   For cough I have prescribed for you A prescription cough medication called Tessalon Perles 100 mg. You Dixson take 1-2 capsules every 8 hours as needed for cough  If you have a sore or scratchy throat, use a saltwater gargle-  to  teaspoon of salt dissolved in a 4-ounce to 8-ounce glass of warm water.  Gargle the solution for approximately 15-30 seconds and then spit.  It is important not to swallow the solution.  You can also use throat lozenges/cough drops and Chloraseptic spray to help with throat pain or discomfort.  Warm or cold liquids can also be helpful in relieving throat pain.  For headache, pain or general discomfort, you can use Ibuprofen or Tylenol as directed.   Some authorities believe that zinc sprays or the use of   Echinacea Oliger shorten the course of your symptoms.   HOME CARE . Only take medications as instructed by your medical team. . Be sure to drink plenty of fluids. Water is fine as well as fruit juices, sodas and electrolyte beverages. You Buttrey want to stay away from caffeine or alcohol. If you are nauseated, try taking small sips of liquids. How do you know if you are getting enough fluid? Your urine should be a pale yellow or almost colorless. . Get rest. . Taking a steamy shower or using a humidifier Munshi help nasal congestion and ease sore throat pain. You can place a towel over your head and breathe in the steam  from hot water coming from a faucet. . Using a saline nasal spray works much the same way. . Cough drops, hard candies and sore throat lozenges Eckford ease your cough. . Avoid close contacts especially the very young and the elderly . Cover your mouth if you cough or sneeze . Always remember to wash your hands.   GET HELP RIGHT AWAY IF: . You develop worsening fever. . If your symptoms do not improve within 10 days . You develop yellow or green discharge from your nose over 3 days. . You have coughing fits . You develop a severe head ache or visual changes. . You develop shortness of breath, difficulty breathing or start having chest pain . Your symptoms persist after you have completed your treatment plan  MAKE SURE YOU   Understand these instructions.  Will watch your condition.  Will get help right away if you are not doing well or get worse.  Your e-visit answers were reviewed by a board certified advanced clinical practitioner to complete your personal care plan. Depending upon the condition, your plan could have included both over the counter or prescription medications. Please review your pharmacy choice. If there is a problem, you Decoste call our nursing hot line at and have the prescription routed to another pharmacy. Your safety is important to us. If you have drug allergies check your prescription carefully.   You can use MyChart to ask questions about today's visit, request a non-urgent call back, or ask for a work or school excuse for 24 hours related to this e-Visit. If it has been greater than 24 hours you will need to follow up with your provider, or enter a new e-Visit to address those concerns. You will get an e-mail in the next two days asking about your experience.  I hope that your e-visit has been valuable and will speed your recovery. Thank you for using e-visits.  Approximately 5 minutes was spent documenting and reviewing patient's chart.      

## 2021-01-03 ENCOUNTER — Ambulatory Visit (INDEPENDENT_AMBULATORY_CARE_PROVIDER_SITE_OTHER): Payer: No Typology Code available for payment source | Admitting: Nurse Practitioner

## 2021-01-03 ENCOUNTER — Other Ambulatory Visit: Payer: Self-pay

## 2021-01-03 ENCOUNTER — Encounter: Payer: Self-pay | Admitting: Nurse Practitioner

## 2021-01-03 VITALS — HR 107 | Temp 98.4°F | Ht 61.0 in | Wt 221.0 lb

## 2021-01-03 DIAGNOSIS — J329 Chronic sinusitis, unspecified: Secondary | ICD-10-CM

## 2021-01-03 DIAGNOSIS — J069 Acute upper respiratory infection, unspecified: Secondary | ICD-10-CM

## 2021-01-03 DIAGNOSIS — B9689 Other specified bacterial agents as the cause of diseases classified elsewhere: Secondary | ICD-10-CM

## 2021-01-03 MED ORDER — LEVOFLOXACIN 500 MG PO TABS: 500.0000 mg | ORAL_TABLET | Freq: Every day | ORAL | 0 refills | Status: DC

## 2021-01-03 MED ORDER — HYDROCOD POLST-CPM POLST ER 10-8 MG/5ML PO SUER: 5.0000 mL | Freq: Two times a day (BID) | ORAL | 0 refills | Status: DC | PRN

## 2021-01-03 MED ORDER — PREDNISONE 20 MG PO TABS: ORAL_TABLET | ORAL | 0 refills | Status: DC

## 2021-01-03 NOTE — Progress Notes (Signed)
   Subjective:    Patient ID: Jessica [name], female    DOB: 01-02-1971, 50 y.o.   MRN: [id number]  HPI Cough and congestion , coughing up green/ yellow mucous Sinus pressure behind the eyes - just completed doxycycline - tessalon does not work  R ear pressure Has had 2 separate E visits.  12/25/2020 acute bacterial sinusitis and 12/29/2020 viral URI.  Also has had recurrent sinusitis.  Has seen Dr. Benjamine Mola for ENT issues in the past. Continues to have head congestion.  Headache has improved.  Right ear fullness, no pain.  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  Sore throat.  Cough is much worse at night.  No wheezing.  Producing yellow-green mucus during the day.  Difficult to sleep at nighttime due to frequent cough. Taking fluids well.  Voiding normally.    Objective:   Physical Exam NAD.  Alert, oriented.  TMs clear effusion, no erythema.  Right TM small perforation is noted when patient creates pressure with Valsalva.  Nares clear.  Pharynx mildly erythematous with PND noted.  Neck supple consult anterior adenopathy.  Lungs clear.  Heart regular rate rhythm. Today's Vitals   01/03/21 1130  Pulse: (!) 107  Temp: 98.4 F (36.9 C)  SpO2: 99%  Weight: 221 lb (100.2 kg)  Height: 5\' 1"  (1.549 m)   Body mass index is 41.76 kg/m.         Assessment & Plan:   Problem List Items Addressed This Visit   None Visit Diagnoses     Bacterial URI    -  Primary   Recurrent sinusitis       Relevant Medications   levofloxacin (LEVAQUIN) 500 MG tablet   predniSONE (DELTASONE) 20 MG tablet   chlorpheniramine-HYDROcodone (TUSSIONEX PENNKINETIC ER) 10-8 MG/5ML SUER      Meds ordered this encounter  Medications   levofloxacin (LEVAQUIN) 500 MG tablet    Sig: Take 1 tablet (500 mg total) by mouth daily.    Dispense:  10 tablet    Refill:  0    Order Specific Question:   Supervising Provider    Answer:   Sallee Lange A [[id number]]   predniSONE (DELTASONE) 20 MG tablet     Sig: 3 po qd x 3 d then 2 po qd x 3 d then 1 po qd x 2 d    Dispense:  17 tablet    Refill:  0    Order Specific Question:   Supervising Provider    Answer:   Sallee Lange A [[id number]]   chlorpheniramine-HYDROcodone (TUSSIONEX PENNKINETIC ER) 10-8 MG/5ML SUER    Sig: Take 5 mLs by mouth every 12 (twelve) hours as needed for cough.    Dispense:  100 mL    Refill:  0    Order Specific Question:   Supervising Provider    Answer:   Sallee Lange A [[id number]]   Continue OTC meds as directed during the day.  Drowsiness precautions with Tussionex.  Mainly for nighttime use.  Call back by the end of next week if no improvement, sooner if worse. Patient has been referred back to Dr. Benjamine Mola for evaluation of recurrent sinusitis.

## 2021-01-29 ENCOUNTER — Telehealth: Payer: Self-pay | Admitting: Family Medicine

## 2021-01-29 ENCOUNTER — Other Ambulatory Visit: Payer: Self-pay | Admitting: Nurse Practitioner

## 2021-01-29 MED ORDER — CIPRO HC 0.2-1 % OT SUSP: 3.0000 [drp] | Freq: Two times a day (BID) | OTIC | 0 refills | Status: DC

## 2021-01-29 MED ORDER — NEOMYCIN-POLYMYXIN-HC 3.5-10000-1 OT SOLN: 4.0000 [drp] | Freq: Four times a day (QID) | OTIC | 0 refills | Status: DC

## 2021-01-29 NOTE — Telephone Encounter (Signed)
Patient wanting to know if she can be seen at [facility] for ear pain today they are short handed and cant make the 1:30 appointment here. Please advise

## 2021-01-29 NOTE — Progress Notes (Signed)
-  otitis externa to right ear

## 2021-01-29 NOTE — Telephone Encounter (Signed)
Called pt and she states she called for an appt for ear pain today. 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

## 2021-03-13 ENCOUNTER — Encounter: Payer: Self-pay | Admitting: Nurse Practitioner

## 2021-03-15 ENCOUNTER — Other Ambulatory Visit: Payer: Self-pay | Admitting: Nurse Practitioner

## 2021-03-22 ENCOUNTER — Other Ambulatory Visit: Payer: Self-pay | Admitting: Nurse Practitioner

## 2021-03-22 DIAGNOSIS — F419 Anxiety disorder, unspecified: Secondary | ICD-10-CM

## 2021-03-22 DIAGNOSIS — R7303 Prediabetes: Secondary | ICD-10-CM

## 2021-03-22 DIAGNOSIS — K219 Gastro-esophageal reflux disease without esophagitis: Secondary | ICD-10-CM

## 2021-03-22 DIAGNOSIS — I10 Essential (primary) hypertension: Secondary | ICD-10-CM

## 2021-03-22 DIAGNOSIS — E7800 Pure hypercholesterolemia, unspecified: Secondary | ICD-10-CM

## 2021-03-22 DIAGNOSIS — G4700 Insomnia, unspecified: Secondary | ICD-10-CM

## 2021-03-22 MED ORDER — OMEPRAZOLE 20 MG PO CPDR
20.0000 mg | DELAYED_RELEASE_CAPSULE | Freq: Every day | ORAL | 1 refills | Status: DC
  Filled 2021-03-22: qty 90, 90d supply, fill #0

## 2021-03-22 MED ORDER — ATORVASTATIN CALCIUM 10 MG PO TABS
10.0000 mg | ORAL_TABLET | Freq: Every day | ORAL | 0 refills | Status: DC
  Filled 2021-03-22: qty 90, 90d supply, fill #0

## 2021-03-22 MED ORDER — CITALOPRAM HYDROBROMIDE 20 MG PO TABS
30.0000 mg | ORAL_TABLET | Freq: Every day | ORAL | 0 refills | Status: DC
  Filled 2021-03-22 – 2021-04-17 (×2): qty 135, 90d supply, fill #0

## 2021-03-22 MED ORDER — METFORMIN HCL 500 MG PO TABS
500.0000 mg | ORAL_TABLET | Freq: Two times a day (BID) | ORAL | 0 refills | Status: DC
  Filled 2021-03-22 – 2021-04-17 (×2): qty 180, 90d supply, fill #0

## 2021-03-22 MED ORDER — LISINOPRIL 20 MG PO TABS
ORAL_TABLET | ORAL | 0 refills | Status: DC
  Filled 2021-03-22: qty 135, 90d supply, fill #0

## 2021-03-24 ENCOUNTER — Other Ambulatory Visit (HOSPITAL_COMMUNITY): Payer: Self-pay

## 2021-03-25 ENCOUNTER — Other Ambulatory Visit (HOSPITAL_COMMUNITY): Payer: Self-pay

## 2021-03-25 ENCOUNTER — Other Ambulatory Visit: Payer: Self-pay | Admitting: Nurse Practitioner

## 2021-03-25 DIAGNOSIS — I10 Essential (primary) hypertension: Secondary | ICD-10-CM

## 2021-03-25 DIAGNOSIS — E7800 Pure hypercholesterolemia, unspecified: Secondary | ICD-10-CM

## 2021-03-27 ENCOUNTER — Other Ambulatory Visit (HOSPITAL_COMMUNITY): Payer: Self-pay

## 2021-03-28 ENCOUNTER — Ambulatory Visit (INDEPENDENT_AMBULATORY_CARE_PROVIDER_SITE_OTHER): Payer: No Typology Code available for payment source

## 2021-03-28 ENCOUNTER — Telehealth: Payer: Self-pay | Admitting: Radiology

## 2021-03-28 ENCOUNTER — Other Ambulatory Visit: Payer: Self-pay

## 2021-03-28 ENCOUNTER — Ambulatory Visit (INDEPENDENT_AMBULATORY_CARE_PROVIDER_SITE_OTHER): Payer: No Typology Code available for payment source | Admitting: Surgical

## 2021-03-28 ENCOUNTER — Ambulatory Visit (HOSPITAL_COMMUNITY)
Admission: RE | Admit: 2021-03-28 | Discharge: 2021-03-28 | Disposition: A | Discharge status: Home / Self Care | Payer: No Typology Code available for payment source | Source: Ambulatory Visit | Attending: Orthopedic Surgery | Admitting: Orthopedic Surgery

## 2021-03-28 ENCOUNTER — Ambulatory Visit: Payer: Self-pay

## 2021-03-28 DIAGNOSIS — M79605 Pain in left leg: Secondary | ICD-10-CM | POA: Insufficient documentation

## 2021-03-28 DIAGNOSIS — S8012XA Contusion of left lower leg, initial encounter: Secondary | ICD-10-CM | POA: Diagnosis not present

## 2021-03-28 NOTE — Telephone Encounter (Signed)
thanks

## 2021-03-28 NOTE — Progress Notes (Signed)
Left lower ext venous  has been completed. Refer to [facility] under chart review to view preliminary results. Results given to [facility].  03/28/2021  3:54 PM [name] Ellenberger, Bonnye Fava

## 2021-03-28 NOTE — Telephone Encounter (Signed)
[name] called, patient is negative for DVT in the left leg, however she does have a small hematoma where she injured her leg. Study was ordered by [facility].

## 2021-03-29 ENCOUNTER — Other Ambulatory Visit: Payer: Self-pay | Admitting: Nurse Practitioner

## 2021-03-29 ENCOUNTER — Encounter: Payer: Self-pay | Admitting: Surgical

## 2021-03-29 DIAGNOSIS — M79605 Pain in left leg: Secondary | ICD-10-CM

## 2021-03-29 NOTE — Progress Notes (Signed)
Office Visit Note   Patient: Jessica [name]           Date of Birth: 01-01-1971           MRN: [id number] Visit Date: 03/28/2021 Requested by: Erven Colla, DO [place],  [place] [address] PCP: Erven Colla, DO  Subjective: Chief Complaint  Patient presents with   Left Leg - Pain    HPI: Jessica [name] is a 50 y.o. female who presents to the office complaining of shin pain of the left leg.  Patient states that her husband left out of town to go to [employer] and she was sleeping alone last night.  She awoke standing up at the door to her bedroom holding her blanket.  She does not normally sleepwalking and she cannot recall how she got there but she did notice left shin pain primarily with a new left shin abrasion that was not there when she went to sleep.  She is able to ambulate though she does have a little bit of an antalgic gait according to her daughter.  She has constant numbness and tingling sensation in her left foot that she has not had before.  She has no motor dysfunction by her history.  Denies any history of DVT/PE personally but she does report that her mother had a blood clot that she cannot recall what kind.  Denies any groin pain or low back pain.  No radicular pain down the leg.  No knee pain..                ROS: All systems reviewed are negative as they relate to the chief complaint within the history of present illness.  Patient denies fevers or chills.  Assessment & Plan: Visit Diagnoses:  1. Leg hematoma, left, initial encounter   2. Pain in left leg     Plan: Patient is a 50 year old female who presents complaining of left shin pain.  She had an episode where she apparently sleepwalked though she has no history of sleepwalking.  She awoke standing up in her bedroom with onset of left shin pain.  No history of injury that she can recall but she did have a new wound on the anterior shin was not present when she went to bed.  On examination she has tenderness  throughout the anterior compartment of the calf, just near the abrasion primarily.  Ultrasound probe was applied which did demonstrate small hematoma just beneath the abrasion.  Radiographs of the left tib-fib, femur, lumbar spine are negative for any acute changes or injury.  She did have a little fragmentation off one of the vertebral bodies of the lumbar spine but this was present on CT scan from 2019 so no new changes.  Due to the pain with passive motion of the ankle as well as the tenderness in the anterior compartment, compartment pressures were measured which measured around 30 mmHg which was compared with her diastolic pressure of 123XX123 mmHg at the time.  No concern for compartment syndrome.  Plan for her to weight-bear as tolerated but elevate the leg.  Ultrasound of the left lower extremity was obtained to rule out any development of DVT.  She was negative for DVT.  Plan to follow-up in 2 weeks for clinical recheck to ensure that she is improving.  She will call the office with any concerning symptoms if her pain worsens in the meantime.  Follow-Up Instructions: No follow-ups on file.  Orders:  Orders Placed This Encounter  Procedures   XR Tibia/Fibula Left   XR FEMUR MIN 2 VIEWS LEFT   XR Lumbar Spine 2-3 Views   No orders of the defined types were placed in this encounter.     Procedures: No procedures performed   Clinical Data: No additional findings.  Objective: Vital Signs: There were no vitals taken for this visit.  Physical Exam:  Constitutional: Patient appears well-developed HEENT:  Head: Normocephalic Eyes:EOM are normal Neck: Normal range of motion Cardiovascular: Normal rate Pulmonary/chest: Effort normal Neurologic: Patient is alert Skin: Skin is warm Psychiatric: Patient has normal mood and affect  Ortho Exam: Ortho exam demonstrates left leg with mildly increased swelling of the calf and shin compared with the contralateral leg.  Small abrasion over the  anterior shin in the mid calf.  No surrounding erythema around the abrasion.  She has tenderness primarily over this area and just lateral to the anterior tibial shaft.  Compartments are compressible but they do have an increased pressure compared with the contralateral side.  She has no difficulty flexing and extending her toes of the left foot but she does have pain with passive motion of the left ankle when it is dorsiflex and plantarflex.  No knee effusion noted.  No pain with hip range of motion.  Negative straight leg raise.  Specialty Comments:  No specialty comments available.  Imaging: No results found.   PMFS History: Patient Active Problem List   Diagnosis Date Noted   COVID-19 virus infection 04/09/2020   Dysuria 03/08/2020   Hematuria 09/13/2019   Urine frequency 04/04/2019   Routine general medical examination at a health care facility 03/06/2019   Pure hypercholesterolemia 03/06/2019   Other fatigue 03/06/2019   Family history of skin cancer 03/06/2019   Acute low back pain without sciatica 03/06/2019   Morbid obesity ([place]) 09/23/2018   URI, acute 06/20/2018   Back pain 06/20/2018   Right otitis media with effusion 06/20/2018   Prediabetes 11/10/2017   Gastroesophageal reflux disease without esophagitis 11/05/2017   Essential hypertension 10/24/2010   Anxiety 10/24/2010   Past Medical History:  Diagnosis Date   Anxiety    Cystitis, chronic    Gross hematuria    High cholesterol    Hypertension    Obesity     Family History  Problem Relation Age of Onset   Hyperlipidemia Mother    Diabetes Father    Hypertension Father    Stroke Father 33   Cancer Paternal Grandfather        skin   Hypertension Paternal Grandmother    Diabetes Paternal Grandmother    Cancer Maternal Grandmother        ovarian    Past Surgical History:  Procedure Laterality Date   ABDOMINAL HYSTERECTOMY     ADENOIDECTOMY     CARPAL TUNNEL RELEASE     TONSILLECTOMY     Social  History   Occupational History   Not on file  Tobacco Use   Smoking status: Former    Types: Cigarettes    Quit date: 06/06/2016    Years since quitting: 4.8   Smokeless tobacco: Never  Substance and Sexual Activity   Alcohol use: No   Drug use: No   Sexual activity: Yes    Birth control/protection: Surgical

## 2021-04-07 ENCOUNTER — Ambulatory Visit (INDEPENDENT_AMBULATORY_CARE_PROVIDER_SITE_OTHER): Payer: No Typology Code available for payment source | Admitting: Family Medicine

## 2021-04-07 ENCOUNTER — Other Ambulatory Visit: Payer: Self-pay

## 2021-04-07 DIAGNOSIS — J310 Chronic rhinitis: Secondary | ICD-10-CM | POA: Diagnosis not present

## 2021-04-07 DIAGNOSIS — R059 Cough, unspecified: Secondary | ICD-10-CM | POA: Diagnosis not present

## 2021-04-07 DIAGNOSIS — J329 Chronic sinusitis, unspecified: Secondary | ICD-10-CM | POA: Diagnosis not present

## 2021-04-07 MED ORDER — AZITHROMYCIN 250 MG PO TABS: ORAL_TABLET | ORAL | 0 refills | Status: AC

## 2021-04-07 MED ORDER — HYDROCOD POLST-CPM POLST ER 10-8 MG/5ML PO SUER: 5.0000 mL | Freq: Every evening | ORAL | 0 refills | Status: DC | PRN

## 2021-04-07 NOTE — Progress Notes (Signed)
Patient ID: Jessica [name], female    DOB: [birth year], 50 y.o.   MRN: [id number]  Virtual Visit via Telephone Note  I connected with Geanette M Buchheit on 04/07/21 at  2:50 PM EDT by telephone and verified that I am speaking with the correct person using two identifiers.  Location: Patient: work Provider: office   I discussed the limitations, risks, security and privacy concerns of performing an evaluation and management service by telephone and the availability of in person appointments. I also discussed with the patient that there Burek be a patient responsible charge related to this service. The patient expressed understanding and agreed to proceed.  Chief Complaint  Patient presents with   Cough    Congestion and sinus pressure- Patient states she has a sinus infection- started last Tuesday- taking otc mucinex- mucus is green- Patient requesting zpack and cough med   Subjective:    HPI Pt stating having a "sinus infection" Treating it last week with otc meds. Using mucinex sinus meds. 3 days ago then worsening coughing. Coughing up green sputum and sore throat. Drainage in back of throat and pressure under eyes. No fever.  Took a covid test at home- last week about 5 days ago, negative. Around grandson and he had RSV over weekend.    Medical History Sharmel has a past medical history of Anxiety, Cystitis, chronic, Gross hematuria, High cholesterol, Hypertension, and Obesity.   Outpatient Encounter Medications as of 04/07/2021  Medication Sig   azithromycin (ZITHROMAX) 250 MG tablet Take 2 tablets on day 1, then 1 tablet daily on days 2 through 5   chlorpheniramine-HYDROcodone (TUSSIONEX PENNKINETIC ER) 10-8 MG/5ML SUER Take 5 mLs by mouth at bedtime as needed for cough.   ALPRAZolam (XANAX) 0.5 MG tablet Take 1 tablet (0.5 mg total) by mouth daily as needed for anxiety.   atorvastatin (LIPITOR) 10 MG tablet Take 1 tablet (10 mg total) by mouth daily.   cetirizine (ZYRTEC) 10 MG tablet  Take 1 tablet (10 mg total) by mouth daily for 30 days.   citalopram (CELEXA) 20 MG tablet Take 1.5 tablets (30 mg total) by mouth daily.   fluticasone (FLONASE) 50 MCG/ACT nasal spray Place 2 sprays into both nostrils daily.   ibuprofen (ADVIL) 600 MG tablet Take 1 tablet (600 mg total) by mouth every 6 (six) hours as needed.   lisinopril (ZESTRIL) 20 MG tablet Take 1/2 tablet by mouth every morning and 1 tablet by mouth at bedtime   metFORMIN (GLUCOPHAGE) 500 MG tablet Take 1 tablet (500 mg total) by mouth 2 (two) times daily.   neomycin-polymyxin-hydrocortisone (CORTISPORIN) OTIC solution Place 4 drops into the right ear 4 (four) times daily.   omeprazole (PRILOSEC) 20 MG capsule Take 1 capsule (20 mg total) by mouth daily.   No facility-administered encounter medications on file as of 04/07/2021.     Review of Systems  Constitutional:  Negative for chills and fever.  HENT:  Positive for postnasal drip, sinus pressure, sinus pain and sore throat. Negative for congestion and rhinorrhea.   Respiratory:  Positive for cough. Negative for shortness of breath and wheezing.   Cardiovascular:  Negative for chest pain and leg swelling.  Gastrointestinal:  Negative for abdominal pain, diarrhea, nausea and vomiting.  Genitourinary:  Negative for dysuria and frequency.  Musculoskeletal:  Negative for arthralgias and back pain.  Skin:  Negative for rash.  Neurological:  Negative for dizziness, weakness and headaches.    Vitals There were no vitals taken for  this visit.  Objective:   Physical Exam No PE due to phone visit.   Assessment and Plan   1. Cough - Novel Coronavirus, NAA ([facility]) - SARS-COV-2, NAA 2 DAY TAT  2. Rhinosinusitis - azithromycin (ZITHROMAX) 250 MG tablet; Take 2 tablets on day 1, then 1 tablet daily on days 2 through 5  Dispense: 6 tablet; Refill: 0 - chlorpheniramine-HYDROcodone (TUSSIONEX PENNKINETIC ER) 10-8 MG/5ML SUER; Take 5 mLs by mouth at bedtime as needed  for cough.  Dispense: 60 mL; Refill: 0   Pt requesting abx and cough syrup.  Likely viral illness.  Gave script and advised that antibiotics won't cure viral syndrome. Since going on for over 1 wk, will give abx.  Cont to use flonase, mucinex, tylenol/ibuprofen, salt water gargles.  Call or rto if worsening. Pt to stay out of work until results of covid testing return.  Return if symptoms worsen or fail to improve.     Follow Up Instructions:    I discussed the assessment and treatment plan with the patient. The patient was provided an opportunity to ask questions and all were answered. The patient agreed with the plan and demonstrated an understanding of the instructions.   The patient was advised to call back or seek an in-person evaluation if the symptoms worsen or if the condition fails to improve as anticipated.  I provided 15 minutes of non-face-to-face time during this encounter.

## 2021-04-07 NOTE — Telephone Encounter (Signed)
Order was placed in [place]. Patient aware.

## 2021-04-08 ENCOUNTER — Ambulatory Visit (INDEPENDENT_AMBULATORY_CARE_PROVIDER_SITE_OTHER): Payer: No Typology Code available for payment source | Admitting: Nurse Practitioner

## 2021-04-08 ENCOUNTER — Encounter: Payer: Self-pay | Admitting: Nurse Practitioner

## 2021-04-08 DIAGNOSIS — J029 Acute pharyngitis, unspecified: Secondary | ICD-10-CM | POA: Diagnosis not present

## 2021-04-08 DIAGNOSIS — J069 Acute upper respiratory infection, unspecified: Secondary | ICD-10-CM

## 2021-04-08 LAB — SARS-COV-2, NAA 2 DAY TAT

## 2021-04-08 LAB — POCT RAPID STREP A (OFFICE): Rapid Strep A Screen: NEGATIVE

## 2021-04-08 LAB — NOVEL CORONAVIRUS, NAA: SARS-CoV-2, NAA: NOT DETECTED

## 2021-04-08 MED ORDER — MAGIC MOUTHWASH W/LIDOCAINE: 5.0000 mL | Freq: Four times a day (QID) | ORAL | 0 refills | Status: DC

## 2021-04-08 NOTE — Progress Notes (Signed)
Acute Office Visit  Subjective:    Patient ID: Jessica [name], female    DOB: [birth year], 50 y.o.   MRN: [id number]  No chief complaint on file.   HPI Patient is in today for sick visit. She has had sore throat and cough x 1 week. She states her grandson has RSV. She was prescribed   Past Medical History:  Diagnosis Date   Anxiety    Cystitis, chronic    Gross hematuria    High cholesterol    Hypertension    Obesity     Past Surgical History:  Procedure Laterality Date   ABDOMINAL HYSTERECTOMY     ADENOIDECTOMY     CARPAL TUNNEL RELEASE     TONSILLECTOMY      Family History  Problem Relation Age of Onset   Hyperlipidemia Mother    Diabetes Father    Hypertension Father    Stroke Father 70   Cancer Paternal Grandfather        skin   Hypertension Paternal Grandmother    Diabetes Paternal Grandmother    Cancer Maternal Grandmother        ovarian    Social History   Socioeconomic History   Marital status: Married    Spouse name: Not on file   Number of children: Not on file   Years of education: Not on file   Highest education level: Not on file  Occupational History   Not on file  Tobacco Use   Smoking status: Former    Types: Cigarettes    Quit date: 06/06/2016    Years since quitting: 4.8   Smokeless tobacco: Never  Substance and Sexual Activity   Alcohol use: No   Drug use: No   Sexual activity: Yes    Birth control/protection: Surgical  Other Topics Concern   Not on file  Social History Narrative   Not on file   Social Determinants of Health   Financial Resource Strain: Not on file  Food Insecurity: Not on file  Transportation Needs: Not on file  Physical Activity: Not on file  Stress: Not on file  Social Connections: Not on file  Intimate Partner Violence: Not on file    Outpatient Medications Prior to Visit  Medication Sig Dispense Refill   ALPRAZolam (XANAX) 0.5 MG tablet Take 1 tablet (0.5 mg total) by mouth daily as needed for  anxiety. 15 tablet 5   atorvastatin (LIPITOR) 10 MG tablet Take 1 tablet (10 mg total) by mouth daily. 90 tablet 0   azithromycin (ZITHROMAX) 250 MG tablet Take 2 tablets on day 1, then 1 tablet daily on days 2 through 5 6 tablet 0   cetirizine (ZYRTEC) 10 MG tablet Take 1 tablet (10 mg total) by mouth daily for 30 days. 30 tablet 0   chlorpheniramine-HYDROcodone (TUSSIONEX PENNKINETIC ER) 10-8 MG/5ML SUER Take 5 mLs by mouth at bedtime as needed for cough. 60 mL 0   citalopram (CELEXA) 20 MG tablet Take 1.5 tablets (30 mg total) by mouth daily. 135 tablet 0   fluticasone (FLONASE) 50 MCG/ACT nasal spray Place 2 sprays into both nostrils daily. 16 g 6   ibuprofen (ADVIL) 600 MG tablet Take 1 tablet (600 mg total) by mouth every 6 (six) hours as needed. 12 tablet 0   lisinopril (ZESTRIL) 20 MG tablet Take 1/2 tablet by mouth every morning and 1 tablet by mouth at bedtime 135 tablet 0   metFORMIN (GLUCOPHAGE) 500 MG tablet Take 1 tablet (500 mg  total) by mouth 2 (two) times daily. 180 tablet 0   neomycin-polymyxin-hydrocortisone (CORTISPORIN) OTIC solution Place 4 drops into the right ear 4 (four) times daily. 10 mL 0   omeprazole (PRILOSEC) 20 MG capsule Take 1 capsule (20 mg total) by mouth daily. 90 capsule 1   No facility-administered medications prior to visit.    Allergies  Allergen Reactions   Hctz [Hydrochlorothiazide] Photosensitivity   Penicillins Itching    Can tolerate cephalosporins.   Vioxx [Rofecoxib] Itching    Review of Systems  Constitutional: Negative.   HENT:  Positive for sore throat and voice change. Negative for congestion and mouth sores.   Respiratory:  Positive for cough. Negative for shortness of breath and wheezing.   Cardiovascular: Negative.       Objective:    Physical Exam Constitutional:      Appearance: Normal appearance.  HENT:     Right Ear: Tympanic membrane, ear canal and external ear normal.     Left Ear: Tympanic membrane, ear canal and  external ear normal.     Nose: Nose normal.     Mouth/Throat:     Mouth: Mucous membranes are moist.     Pharynx: Oropharynx is clear.  Cardiovascular:     Rate and Rhythm: Regular rhythm. Tachycardia present.     Pulses: Normal pulses.     Heart sounds: Normal heart sounds.  Pulmonary:     Effort: Pulmonary effort is normal.     Breath sounds: Normal breath sounds.  Musculoskeletal:     Cervical back: Normal range of motion and neck supple.  Lymphadenopathy:     Cervical: No cervical adenopathy.  Neurological:     Mental Status: She is alert.    There were no vitals taken for this visit. Wt Readings from Last 3 Encounters:  01/03/21 221 lb (100.2 kg)  11/26/20 216 lb (98 kg)  11/01/20 219 lb (99.3 kg)    Health Maintenance Due  Topic Date Due   COVID-19 Vaccine (1) Never done   INFLUENZA VACCINE  02/24/2021    There are no preventive care reminders to display for this patient.   Lab Results  Component Value Date   TSH 1.600 11/21/2020   Lab Results  Component Value Date   WBC 8.9 11/21/2020   HGB 12.3 11/21/2020   HCT 35.9 11/21/2020   MCV 88 11/21/2020   PLT 285 11/21/2020   Lab Results  Component Value Date   NA 142 11/21/2020   K 4.4 11/21/2020   CO2 20 11/21/2020   GLUCOSE 115 (H) 11/21/2020   BUN 11 11/21/2020   CREATININE 0.73 11/21/2020   BILITOT 0.3 11/21/2020   ALKPHOS 90 11/21/2020   AST 18 11/21/2020   ALT 18 11/21/2020   PROT 6.9 11/21/2020   ALBUMIN 4.5 11/21/2020   CALCIUM 9.4 11/21/2020   ANIONGAP 8 03/03/2018   EGFR 102 11/21/2020   Lab Results  Component Value Date   CHOL 151 11/21/2020   Lab Results  Component Value Date   HDL 43 11/21/2020   Lab Results  Component Value Date   LDLCALC 90 11/21/2020   Lab Results  Component Value Date   TRIG 94 11/21/2020   Lab Results  Component Value Date   CHOLHDL 3.5 11/21/2020   Lab Results  Component Value Date   HGBA1C 6.4 (H) 11/21/2020       Assessment & Plan:    Problem List Items Addressed This Visit       Respiratory  URI, acute    -taking z-pack and tussionex cough syrup currently -Strep test was negative today -continue abx and cough syrup -Rx. MMW with lidocaine for sore throat -tonsils visualized; no swelling, uvula midline -she is hoarse      Relevant Medications   magic mouthwash w/lidocaine SOLN   Other Visit Diagnoses     Sore throat    -  Primary   Relevant Orders   POCT rapid strep A (Completed)        Meds ordered this encounter  Medications   magic mouthwash w/lidocaine SOLN    Sig: Take 5 mLs by mouth 4 (four) times daily. Swish and swallow    Dispense:  240 mL    Refill:  0    Nystatin/Dexamethasone/Benadryl ( 1:2:5) w/ Lidocaine 1:1     Noreene Larsson, NP

## 2021-04-08 NOTE — Patient Instructions (Signed)
If no improvement after finishing z-pack, return to clinic.

## 2021-04-08 NOTE — Assessment & Plan Note (Signed)
-  taking z-pack and tussionex cough syrup currently -Strep test was negative today -continue abx and cough syrup -Rx. MMW with lidocaine for sore throat -tonsils visualized; no swelling, uvula midline -she is hoarse

## 2021-04-09 NOTE — Telephone Encounter (Signed)
Yes ma'am ; I sent it to her earlier.  (When I looked yesterday to see if she had been seen, the visit from the other day did not show up)

## 2021-04-17 ENCOUNTER — Other Ambulatory Visit: Payer: Self-pay | Admitting: Nurse Practitioner

## 2021-04-17 ENCOUNTER — Other Ambulatory Visit (HOSPITAL_COMMUNITY): Payer: Self-pay

## 2021-04-17 DIAGNOSIS — J029 Acute pharyngitis, unspecified: Secondary | ICD-10-CM

## 2021-04-17 MED ORDER — AZITHROMYCIN 250 MG PO TABS: ORAL_TABLET | ORAL | 0 refills | Status: DC

## 2021-05-16 ENCOUNTER — Ambulatory Visit: Payer: No Typology Code available for payment source | Admitting: Medical

## 2021-05-20 ENCOUNTER — Ambulatory Visit (INDEPENDENT_AMBULATORY_CARE_PROVIDER_SITE_OTHER): Payer: No Typology Code available for payment source | Admitting: Medical

## 2021-05-20 ENCOUNTER — Telehealth: Payer: Self-pay

## 2021-05-20 ENCOUNTER — Encounter: Payer: Self-pay | Admitting: Medical

## 2021-05-20 VITALS — BP 120/80 | HR 70 | Ht 61.0 in | Wt 223.6 lb

## 2021-05-20 DIAGNOSIS — I10 Essential (primary) hypertension: Secondary | ICD-10-CM

## 2021-05-20 DIAGNOSIS — F419 Anxiety disorder, unspecified: Secondary | ICD-10-CM | POA: Diagnosis not present

## 2021-05-20 DIAGNOSIS — E7800 Pure hypercholesterolemia, unspecified: Secondary | ICD-10-CM | POA: Diagnosis not present

## 2021-05-20 DIAGNOSIS — H6121 Impacted cerumen, right ear: Secondary | ICD-10-CM

## 2021-05-20 DIAGNOSIS — R7303 Prediabetes: Secondary | ICD-10-CM

## 2021-05-20 MED ORDER — OZEMPIC (0.25 OR 0.5 MG/DOSE) 2 MG/1.5ML ~~LOC~~ SOPN: 0.5000 mg | PEN_INJECTOR | SUBCUTANEOUS | 1 refills | Status: DC

## 2021-05-20 NOTE — Patient Instructions (Addendum)
Recommendations: Cut out the Dr. Malachi Bonds! Exercise most days, at least 150 minutes weekly Drink 80-100 ounces of water daily Make some changes in the diet to cut out calories or sugars Begin trial of Ozempic.  Start out at 0.25mg  weekly for 2 weeks.  If no nausea or side effects you can increase to the 0.5mg  weekly dose Recheck fasting for physical in 5-6 weeks   Prediabetes means you have a higher than normal blood sugar level. It's not high enough to be considered type 2 diabetes yet, but without making some lifestyle changes you are more likely to develop type 2 diabetes.  If you have prediabetes, the long-term damage of diabetes, especially to your heart, blood vessels and kidneys Dagher already be starting. You Frees not be able to change certain risk factors such as age, race, or family history, but you CAN make changes to your lifestyle, your eating habits, and your activity.  Although diabetes can develop at any age, the risk of prediabetes increases after age 22.  Your risk of prediabetes increases if you have a parent or sibling with type 2 diabetes.   Although it's unclear why, certain people including Black, Hispanic, American 46 and [place] American people, are more likely to develop prediabetes.  Ways to prevent or slow progression to diabetes: Eat healthy foods - Eating red meat and processed meat, and drinking sugar-sweetened beverages, is associated with a higher risk of prediabetes. A diet high in fruits, vegetables, nuts, whole grains and olive oil is associated with a lower risk of prediabetes. Get at least 150 minutes of moderate aerobic physical activity a week, or about 30 minutes on most days of the week.  The less active you are, the greater your risk of prediabetes. Physical activity helps you control your weight, uses up sugar for energy and makes the body use insulin more effectively. Lose excess weight - Being overweight is a primary risk factor for prediabetes. The more fatty  tissue you have, especially inside and between the muscle and skin around your abdomen, the more resistant your cells become to insulin. Waist size. A large waist size can indicate insulin resistance. The risk of insulin resistance goes up for men with waists larger than 40 inches and for women with waists larger than 35 inches. Control your blood pressure and cholesterol.  If your blood pressure is not 130/80 or less, discuss with your provider to help get this under control.  It is ideal to have an HDL good cholesterol number >50 and have a LDL bad cholesterol number <100.   Don't smoke One simple strategy to help you make good food choices and eat appropriate portions sizes is to divide up your plate. These three divisions on your plate promote healthy eating:  One-half: fruit and nonstarchy vegetables One-quarter: whole grains One-quarter: protein-rich foods, such as legumes, fish or lean meats   Sample meal planning  Breakfast You Guttierrez eat 1 of the following Smoothie with Almond milk, handful of kale or spinach, and 1-2 fruit servings of your choice such as berries or 1/2 banana Whole grain slice of toast and thin layer of low sugar jam or small amount of honey Whole grain slice of toast and avocado spread 1/2 cup of steel cut oats (oatmeal)   Mid-morning snack 1 fruit serving such as one of the following: medium-sized apple medium-sized orange, Tangerine 1/2 banana  3/4 cup of fresh berries or frozen berries  A protein source such as one of the following: 8  almonds  small handful of walnuts or other nuts Hummus and vegetable such as carrots   Lunch A protein source such as 1 of the following: 1 serving of beans such as black beans, pinto beans, green beans, or edamame (soy beans) Veggie burger  Non breaded fish such as salmon or tuna, either baked, grilled, or broiled Vegetable - Half of your plate should be a non-starchy vegetables!  So avoid white potatoes and corn.   Otherwise, eat a large portion of vegetables. Avocado, cucumber, tomato, carrots, greens, lettuce, squash, okra, etc.  Vegetables can include salad with olive oil/vinaigrette dressing Grains such as 1/2 cup of brown rice, quinoa, barley or other whole grain or 1 or 2 slices of whole grain bread   Mid-afternoon snack 1 fruit serving such as one of the following: medium-sized apple medium-sized orange, Tangerine 1/2 banana  3/4 cup of fresh berries or frozen berries  A protein source such as one of the following: 8 almonds  small handful of walnuts or other nuts Hummus and vegetable such as carrots   Dinner A protein source such as 1 of the following: 1 serving of beans such as black beans, pinto beans, green beans, or edamame (soy beans) Veggie burger  Non breaded fish such as salmon or tuna, either baked, grilled, or broiled Vegetable - Half of your plate should be a non-starchy vegetables!  So avoid white potatoes and corn.  Otherwise, eat a large portion of vegetables. Avocado, cucumber, tomato, carrots, greens, lettuce, squash, okra, etc.  Vegetables can include salad with olive oil/vinaigrette dressing Grains such as 1/2 cup of brown rice, quinoa, barley or other whole grain or 1 or 2 slices of whole grain bread   Beverages: Water Unsweet tea Home made juice with a juicer without sugar added other than small bit of honey or agave nectar Water with sugar free flavor such as [place]   AVOID.... For the time being I want you to cut out the following items completely: Soda, sweet tea, juice, beer or wine or alcohol Sweets such as cake, candy, pies, chips, cookies, chocolate

## 2021-05-20 NOTE — Progress Notes (Signed)
Subjective:  Jessica [name] is a 50 y.o. female who presents for Chief Complaint  Patient presents with   new pt get established    New pt get established. Needs refills on medication. Would like to discuss weight     New patient today to establish care.  Was seeing Dr. Golden Circle and Dr. Lovena Le prior at different primary care office.    She has a medical history significant for high blood pressure, high cholesterol, prediabetes, obesity, anxiety.  Has water in her ear bothering her from shower this morning.   Only is in right ear.   Has history of perforation.  Has seen Dr. Benjamine Mola and Dr. Constance Holster ENT in past, advised flonase.  They did not advised surgery.    Anxiety - uses Xanax periodically, not daily, no regularly.  Uses Celexa 20mg  daily.   This was being prescribed by primary care.  No prior psychiatry eval.   Not current seeing counselor.  Prediabetic - not checking sugars, doesn't not have glucometer.  Is on metformin BID x 2 years  Compliant with Lipitor without c/o  Compliant with Lisinopril 20mg  daily without c/o.  Concerned about weight.  Has tried phentermine prior, will see benefit but then gains back.   Was in weight manage program in past.  Feels like she eats healthy.  Jessica [name] is still a problems.  Exercise - some, minimal.  No other aggravating or relieving factors.    No other c/o.  The following portions of the patient's history were reviewed and updated as appropriate: allergies, current medications, past family history, past medical history, past social history, past surgical history and problem list.  ROS Otherwise as in subjective above  Objective: BP 120/80   Pulse 70   Ht 5\' 1"  (1.549 m)   Wt 223 lb 9.6 oz (101.4 kg)   BMI 42.25 kg/m   Wt Readings from Last 3 Encounters:  05/20/21 223 lb 9.6 oz (101.4 kg)  01/03/21 221 lb (100.2 kg)  11/26/20 216 lb (98 kg)   BP Readings from Last 3 Encounters:  05/20/21 120/80  11/26/20 (!) 138/96  11/01/20 (!) 130/92     General appearance: alert, no distress, well developed, well nourished HEENT: normocephalic, sclerae anicteric, conjunctiva pink and moist, right TM with moderate wet yellow wax, can barely visualize TM.  Left TM and canal normal Oral cavity: MMM, no lesions Neck: supple, no lymphadenopathy, no thyromegaly, no masses Heart: RRR, normal S1, S2, no murmurs Lungs: CTA bilaterally, no wheezes, rhonchi, or rales Pulses: 2+ radial pulses, 2+ pedal pulses, normal cap refill Ext: no edema    Assessment: Encounter Diagnoses  Name Primary?   Essential hypertension Yes   Prediabetes    Pure hypercholesterolemia    Anxiety    Morbid obesity (Jessica [name])    Cerumen debris on tympanic membrane of right ear      Plan: Obesity, prediabetes-work on efforts to lose weight through healthy diet and exercise.  Cut out soda.  Start exercising.  Begin trial of Ozempic for prediabetes and weight.  If Ozempic covered by insurance she will stop metformin for now  I reviewed labs done earlier in the year.  Her hemoglobin A1c's have been stable in the 6 range  Hypertension-continue current medication  Hyperlipidemia-continue current medication  Cerumen-given prior perforation or possible perforation she elected not to have irrigation lavage.  Consider ENT follow-up   Jessica [name] was seen today for new pt get established.  Diagnoses and all orders for this visit:  Essential hypertension  Prediabetes  Pure hypercholesterolemia  Anxiety  Morbid obesity ([place])  Cerumen debris on tympanic membrane of right ear  Other orders -     Semaglutide,0.25 or 0.5MG /DOS, (OZEMPIC, 0.25 OR 0.5 MG/DOSE,) 2 MG/1.5ML SOPN; Inject 0.5 mg into the skin once a week.   Follow up: 4-6 weeks for physical

## 2021-05-20 NOTE — Telephone Encounter (Signed)
Patient scheduled afternoon physical 07/07/21 please place future labs if possible she is going to have labs drawn at her office if appropriate prior to her physical here

## 2021-05-22 ENCOUNTER — Other Ambulatory Visit: Payer: Self-pay | Admitting: Medical

## 2021-05-22 DIAGNOSIS — R7303 Prediabetes: Secondary | ICD-10-CM

## 2021-05-22 DIAGNOSIS — R7309 Other abnormal glucose: Secondary | ICD-10-CM

## 2021-05-22 DIAGNOSIS — I10 Essential (primary) hypertension: Secondary | ICD-10-CM

## 2021-07-02 ENCOUNTER — Other Ambulatory Visit: Payer: Self-pay

## 2021-07-02 ENCOUNTER — Other Ambulatory Visit: Payer: Self-pay | Admitting: Nurse Practitioner

## 2021-07-02 DIAGNOSIS — I10 Essential (primary) hypertension: Secondary | ICD-10-CM

## 2021-07-03 ENCOUNTER — Other Ambulatory Visit: Payer: Self-pay

## 2021-07-04 ENCOUNTER — Encounter: Payer: No Typology Code available for payment source | Admitting: Medical

## 2021-07-07 ENCOUNTER — Encounter: Payer: No Typology Code available for payment source | Admitting: Medical

## 2021-07-16 ENCOUNTER — Other Ambulatory Visit: Payer: Self-pay | Admitting: Medical

## 2021-07-16 ENCOUNTER — Other Ambulatory Visit (HOSPITAL_COMMUNITY): Payer: Self-pay

## 2021-07-16 DIAGNOSIS — I10 Essential (primary) hypertension: Secondary | ICD-10-CM

## 2021-07-17 ENCOUNTER — Other Ambulatory Visit (HOSPITAL_COMMUNITY): Payer: Self-pay

## 2021-07-17 MED ORDER — LISINOPRIL 20 MG PO TABS
ORAL_TABLET | ORAL | 3 refills | Status: DC
  Filled 2021-07-17: qty 135, 90d supply, fill #0
  Filled 2021-09-25: qty 135, 90d supply, fill #1
  Filled 2022-01-08: qty 135, 90d supply, fill #2

## 2021-07-30 ENCOUNTER — Telehealth (INDEPENDENT_AMBULATORY_CARE_PROVIDER_SITE_OTHER): Payer: No Typology Code available for payment source | Admitting: Family Medicine

## 2021-07-30 ENCOUNTER — Other Ambulatory Visit: Payer: Self-pay

## 2021-07-30 ENCOUNTER — Encounter: Payer: Self-pay | Admitting: Family Medicine

## 2021-07-30 VITALS — Temp 102.0°F | Wt 223.0 lb

## 2021-07-30 DIAGNOSIS — U071 COVID-19: Secondary | ICD-10-CM

## 2021-07-30 MED ORDER — HYDROCOD POLST-CPM POLST ER 10-8 MG/5ML PO SUER: 5.0000 mL | Freq: Two times a day (BID) | ORAL | 0 refills | Status: DC | PRN

## 2021-07-30 NOTE — Progress Notes (Signed)
° °  Subjective:    Patient ID: Jessica [name], female    DOB: [birth year], 51 y.o.   MRN: [id number]  HPI Documentation for virtual audio and video telecommunications through [place] encounter: The patient was located at home. 2 patient identifiers used.  The provider was located in the office. The patient did consent to this visit and is aware of possible charges through their insurance for this visit. The other persons participating in this telemedicine service were none. Time spent on call was 4 minutes and in review of previous records >20 minutes total for counseling and coordination of care. This virtual service is not related to other E/M service within previous 7 days.  She states that last Saturday she developed a sore throat and sneezing following the next day by fever, chills, myalgias.  Temperature she states was 104.7.  She tested positive for COVID on that day.  She has had 2 vaccines in the past.  She has been using Tylenol for fever aches and pains.  She states that she is starting to feel better but is still running a temperature with sore throat, fatigue, sinus congestion and a dry cough.  Review of Systems     Objective:   Physical Exam Alert and in no distress otherwise not examined       Assessment & Plan:  COVID-19 virus infection She is to continue with ibuprofen as well as using Tylenol alternating.  Robitussin-DM during the day and NyQuil at night.  Since she is improving, she Martire return to work using a mask on Friday but she will check with [place] to make sure they are on the same page.  She is comfortable with that.

## 2021-08-05 ENCOUNTER — Other Ambulatory Visit (HOSPITAL_COMMUNITY): Payer: Self-pay

## 2021-08-06 ENCOUNTER — Other Ambulatory Visit: Payer: Self-pay | Admitting: Nurse Practitioner

## 2021-08-06 DIAGNOSIS — G4700 Insomnia, unspecified: Secondary | ICD-10-CM

## 2021-08-06 DIAGNOSIS — F419 Anxiety disorder, unspecified: Secondary | ICD-10-CM

## 2021-08-06 DIAGNOSIS — E7800 Pure hypercholesterolemia, unspecified: Secondary | ICD-10-CM

## 2021-08-07 ENCOUNTER — Other Ambulatory Visit (HOSPITAL_COMMUNITY): Payer: Self-pay

## 2021-08-08 ENCOUNTER — Encounter: Payer: Self-pay | Admitting: Internal Medicine

## 2021-08-12 ENCOUNTER — Other Ambulatory Visit (HOSPITAL_COMMUNITY): Payer: Self-pay

## 2021-08-12 ENCOUNTER — Telehealth: Payer: No Typology Code available for payment source | Admitting: Physician Assistant

## 2021-08-12 DIAGNOSIS — R3989 Other symptoms and signs involving the genitourinary system: Secondary | ICD-10-CM | POA: Diagnosis not present

## 2021-08-12 MED ORDER — CIPROFLOXACIN HCL 500 MG PO TABS: 500.0000 mg | ORAL_TABLET | Freq: Two times a day (BID) | ORAL | 0 refills | Status: AC

## 2021-08-12 MED ORDER — OZEMPIC (0.25 OR 0.5 MG/DOSE) 2 MG/1.5ML ~~LOC~~ SOPN
PEN_INJECTOR | SUBCUTANEOUS | 1 refills | Status: DC
  Filled 2021-08-12: qty 1.5, 30d supply, fill #0

## 2021-08-12 NOTE — Progress Notes (Signed)
E-Visit for Urinary Problems  We are sorry that you are not feeling well.  Here is how we plan to help!  Based on what you shared with me it looks like you most likely have a simple urinary tract infection.  A UTI (Urinary Tract Infection) is a bacterial infection of the bladder.  Most cases of urinary tract infections are simple to treat but a key part of your care is to encourage you to drink plenty of fluids and watch your symptoms carefully.  I have prescribed Cipro 500mg  Take one tablet twice daily for 5 days.  Your symptoms should gradually improve. Call us if the burning in your urine worsens, you develop worsening fever, back pain or pelvic pain or if your symptoms do not resolve after completing the antibiotic.  Urinary tract infections can be prevented by drinking plenty of water to keep your body hydrated.  Also be sure when you wipe, wipe from front to back and don't hold it in!  If possible, empty your bladder every 4 hours.  HOME CARE Drink plenty of fluids Compete the full course of the antibiotics even if the symptoms resolve Remember, when you need to gogo. Holding in your urine can increase the likelihood of getting a UTI! GET HELP RIGHT AWAY IF: You cannot urinate You get a high fever Worsening back pain occurs You see blood in your urine You feel sick to your stomach or throw up You feel like you are going to pass out  MAKE SURE YOU  Understand these instructions. Will watch your condition. Will get help right away if you are not doing well or get worse.   Thank you for choosing an e-visit.  Your e-visit answers were reviewed by a board certified advanced clinical practitioner to complete your personal care plan. Depending upon the condition, your plan could have included both over the counter or prescription medications.  Please review your pharmacy choice. Make sure the pharmacy is open so you can pick up prescription now. If there is a problem, you Deason  contact your provider through [employer] and have the prescription routed to another pharmacy.  Your safety is important to [place]. If you have drug allergies check your prescription carefully.   For the next 24 hours you can use MyChart to ask questions about today's visit, request a non-urgent call back, or ask for a work or school excuse. You will get an email in the next two days asking about your experience. I hope that your e-visit has been valuable and will speed your recovery.  I provided 5 minutes of non face-to-face time during this encounter for chart review and documentation.

## 2021-08-25 ENCOUNTER — Other Ambulatory Visit (HOSPITAL_COMMUNITY): Payer: Self-pay

## 2021-08-25 ENCOUNTER — Telehealth: Payer: Self-pay | Admitting: Internal Medicine

## 2021-08-25 DIAGNOSIS — E7800 Pure hypercholesterolemia, unspecified: Secondary | ICD-10-CM

## 2021-08-25 MED ORDER — ATORVASTATIN CALCIUM 10 MG PO TABS
10.0000 mg | ORAL_TABLET | Freq: Every day | ORAL | 0 refills | Status: DC
  Filled 2021-08-25 – 2021-09-25 (×2): qty 90, 90d supply, fill #0

## 2021-08-25 MED ORDER — OZEMPIC (0.25 OR 0.5 MG/DOSE) 2 MG/1.5ML ~~LOC~~ SOPN
0.5000 mg | PEN_INJECTOR | SUBCUTANEOUS | 1 refills | Status: DC
  Filled 2021-08-25: qty 1.5, fill #0

## 2021-08-25 NOTE — Telephone Encounter (Signed)
Pt asked for a refill on atorvastatin and ozempic

## 2021-08-29 ENCOUNTER — Telehealth (INDEPENDENT_AMBULATORY_CARE_PROVIDER_SITE_OTHER): Payer: No Typology Code available for payment source | Admitting: Medical

## 2021-08-29 DIAGNOSIS — R300 Dysuria: Secondary | ICD-10-CM

## 2021-08-29 DIAGNOSIS — R3915 Urgency of urination: Secondary | ICD-10-CM | POA: Insufficient documentation

## 2021-08-29 DIAGNOSIS — Z87440 Personal history of urinary (tract) infections: Secondary | ICD-10-CM | POA: Diagnosis not present

## 2021-08-29 MED ORDER — NITROFURANTOIN MONOHYD MACRO 100 MG PO CAPS: 100.0000 mg | ORAL_CAPSULE | Freq: Two times a day (BID) | ORAL | 0 refills | Status: DC

## 2021-08-29 NOTE — Progress Notes (Signed)
Subjective:     Patient ID: Jessica [name], female   DOB: 13-Jun-1971, 51 y.o.   MRN: [id number]  This visit type was conducted due to national recommendations for restrictions regarding the COVID-19 Pandemic (e.g. social distancing) in an effort to limit this patient's exposure and mitigate transmission in our community.  Due to their co-morbid illnesses, this patient is at least at moderate risk for complications without adequate follow up.  This format is felt to be most appropriate for this patient at this time.    Documentation for virtual audio and video telecommunications through [place] encounter:  The patient was located at home. The provider was located in the office. The patient did consent to this visit and is aware of possible charges through their insurance for this visit.  The other persons participating in this telemedicine service were none. Time spent on call was 20 minutes and in review of previous records 20 minutes total.  This virtual service is not related to other E/M service within previous 7 days.   HPI Chief Complaint  Patient presents with   UTI    Did evisit the other day for possible uti. Pt states she still has symptoms. Did urine at work the other day. Still having pain in lower stomach, feels like she has to urinate more but can't   Virtual consult for UTI symptoms.    Did a virtual consult for UTI symptoms 2 weeks ago.   Was given cipro.  Did a 5 day course.  She never felt completely resolved.    Current symptoms include pain in lower abdomen, feeling of urgency, after voiding feels urgency to go more.  No burning sensation.  No blood in urine.  No fever, no body aches or chills.  No back pain.  No other abdominal discomfort.   No nausea or vomiting.  No vaginal discharge.     S/p hysterectomy.  Still has one ovary.  Married, no concern for STD  In the past year thinks she has had UTI maybe 3 or so.   Last urology consult years ago.     Has cut out  soft drinks since first of the year.  Drinks water and use.    Just recently started on ozempic.   Past Medical History:  Diagnosis Date   Anxiety    Cystitis, chronic    Gross hematuria    High cholesterol    Hypertension    Obesity    Current Outpatient Medications on File Prior to Visit  Medication Sig Dispense Refill   ALPRAZolam (XANAX) 0.5 MG tablet Take 1 tablet (0.5 mg total) by mouth daily as needed for anxiety. 15 tablet 5   atorvastatin (LIPITOR) 10 MG tablet Take 1 tablet (10 mg total) by mouth daily. 90 tablet 0   citalopram (CELEXA) 20 MG tablet Take 1.5 tablets (30 mg total) by mouth daily. 135 tablet 0   Docusate Calcium (STOOL SOFTENER PO) Take by mouth.     fluticasone (FLONASE) 50 MCG/ACT nasal spray Place 2 sprays into both nostrils daily. 16 g 6   lisinopril (ZESTRIL) 20 MG tablet Take 1/2 tablet by mouth every morning and 1 tablet by mouth at bedtime 135 tablet 3   metFORMIN (GLUCOPHAGE) 500 MG tablet Take 1 tablet (500 mg total) by mouth 2 (two) times daily. 180 tablet 0   omeprazole (PRILOSEC) 20 MG capsule Take 1 capsule (20 mg total) by mouth daily. 90 capsule 1   Semaglutide,0.25 or 0.5MG /DOS, (OZEMPIC, 0.25  OR 0.5 MG/DOSE,) 2 MG/1.5ML SOPN Inject 0.5 mg into the skin once a week. 1.5 mL 1   No current facility-administered medications on file prior to visit.     Review of Systems As in subjective    Objective:   Physical Exam Due to coronavirus pandemic stay at home measures, patient visit was virtual and they were not examined in person.   There were no vitals taken for this visit.  Gen: wd, wn, nad      Assessment:     Encounter Diagnoses  Name Primary?   Dysuria Yes   Urinary urgency    History of urinary tract infection        Plan:     We discussed her symptoms and concerns.  She Barnaby come in person today for urinalysis clean-catch and urine culture and other testing.  If she can get by here in time then she will start [place] as a  trial for empiric UTI.  She Lingerfelt ultimately need to go back to urology given potential history of recurrent UTIs several times in the past year  Jessica [name] was seen today for uti.  Diagnoses and all orders for this visit:  Dysuria -     Urinalysis, Routine w reflex microscopic; Future -     Urine Culture; Future  Urinary urgency  History of urinary tract infection  Other orders -     nitrofurantoin, macrocrystal-monohydrate, (MACROBID) 100 MG capsule; Take 1 capsule (100 mg total) by mouth 2 (two) times daily.   F/u either in person, or she Memon go by [facility] for labs if she can't get by today.

## 2021-09-03 ENCOUNTER — Other Ambulatory Visit (HOSPITAL_COMMUNITY): Payer: Self-pay

## 2021-09-17 ENCOUNTER — Telehealth: Payer: Self-pay | Admitting: Internal Medicine

## 2021-09-17 ENCOUNTER — Other Ambulatory Visit (HOSPITAL_COMMUNITY): Payer: Self-pay

## 2021-09-17 MED ORDER — OZEMPIC (0.25 OR 0.5 MG/DOSE) 2 MG/1.5ML ~~LOC~~ SOPN
0.5000 mg | PEN_INJECTOR | SUBCUTANEOUS | 1 refills | Status: DC
  Filled 2021-09-17: qty 1.5, 28d supply, fill #0

## 2021-09-17 NOTE — Telephone Encounter (Signed)
Pt needs a refill on omzempic. She is on .5mg 

## 2021-09-19 ENCOUNTER — Other Ambulatory Visit: Payer: Self-pay | Admitting: Nurse Practitioner

## 2021-09-19 ENCOUNTER — Other Ambulatory Visit (HOSPITAL_COMMUNITY): Payer: Self-pay

## 2021-09-19 DIAGNOSIS — F419 Anxiety disorder, unspecified: Secondary | ICD-10-CM

## 2021-09-19 DIAGNOSIS — G4700 Insomnia, unspecified: Secondary | ICD-10-CM

## 2021-09-25 ENCOUNTER — Other Ambulatory Visit: Payer: Self-pay | Admitting: Nurse Practitioner

## 2021-09-25 DIAGNOSIS — G4700 Insomnia, unspecified: Secondary | ICD-10-CM

## 2021-09-25 DIAGNOSIS — F419 Anxiety disorder, unspecified: Secondary | ICD-10-CM

## 2021-09-26 ENCOUNTER — Other Ambulatory Visit (HOSPITAL_COMMUNITY): Payer: Self-pay

## 2021-09-26 ENCOUNTER — Other Ambulatory Visit: Payer: Self-pay | Admitting: Medical

## 2021-09-26 DIAGNOSIS — F419 Anxiety disorder, unspecified: Secondary | ICD-10-CM

## 2021-09-26 DIAGNOSIS — G4700 Insomnia, unspecified: Secondary | ICD-10-CM

## 2021-09-26 MED ORDER — CITALOPRAM HYDROBROMIDE 20 MG PO TABS
30.0000 mg | ORAL_TABLET | Freq: Every day | ORAL | 0 refills | Status: DC
  Filled 2021-09-26: qty 135, 90d supply, fill #0

## 2021-09-27 ENCOUNTER — Other Ambulatory Visit (HOSPITAL_COMMUNITY): Payer: Self-pay

## 2021-10-10 ENCOUNTER — Telehealth (INDEPENDENT_AMBULATORY_CARE_PROVIDER_SITE_OTHER): Payer: No Typology Code available for payment source | Admitting: Medical

## 2021-10-10 DIAGNOSIS — J301 Allergic rhinitis due to pollen: Secondary | ICD-10-CM

## 2021-10-10 DIAGNOSIS — R6889 Other general symptoms and signs: Secondary | ICD-10-CM

## 2021-10-10 DIAGNOSIS — R0981 Nasal congestion: Secondary | ICD-10-CM | POA: Diagnosis not present

## 2021-10-10 NOTE — Patient Instructions (Signed)
Symptoms suggest more of an allergic rhinitis picture versus a sinus infection. ? ?I recommend you begin over-the-counter allergy pill at bedtime such as Zyrtec or Allegra for the next several weeks.  This medicine alone B[place]ner help to significantly improve your symptoms ? ?However if need be, you can add Pataday allergy eyedrops over-the-counter for itchy watery eyes ? ?For significant stuffy nose and congestion in the head you can use Flonase nasal spray over-the-counter daily ? ?Consider nasal saline flush before bedtime and in the morning to flush out mucus from the sinuses and nostrils ? ?Drink plenty of water daily ? ?If changes in symptoms or not improving call or recheck.  If significant head pressure, upper teeth pain, thick yellow-green mucus different than what you have now then call back ?

## 2021-10-10 NOTE — Progress Notes (Signed)
?Subjective:  ?  ? Patient ID: Jessica [name], female   DOB: 1970-08-08, 51 y.o.   MRN: [id number] ? ?This visit type was conducted due to national recommendations for restrictions regarding the COVID-19 Pandemic (e.g. social distancing) in an effort to limit this patient's exposure and mitigate transmission in our community.  Due to their co-morbid illnesses, this patient is at least at moderate risk for complications without adequate follow up.  This format is felt to be most appropriate for this patient at this time.   ? ?Documentation for virtual audio and video telecommunications through [place] encounter: ? ?The patient was located at home. ?The provider was located in the office. ?The patient did consent to this visit and is aware of possible charges through their insurance for this visit. ? ?The other persons participating in this telemedicine service were none. ?Time spent on call was 20 minutes and in review of previous records 20 minutes total. ? ?This virtual service is not related to other E/M service within previous 7 days. ? ? ?HPI ?Chief Complaint  ?Patient presents with  ? possible sinus infection  ?  Itchy tired eyes   Scratchy thoart.  Nasal congestion and runny ears draining.started monday  ? ?She has concerns for sinus infection.   Nose runny and congestion, throat is itchy, sinuses pressure, ear feels like fluid in ears, itchy eyes.   Started about 5 days ago.  No SOB, no wheezy.   No fever.  No NVD.   No cough.  Has scratchy throat, post nasal drainage throughout the day.   Using sudafed.  She notes maybe 3 sinus infections in the past.  Has seen ENT in past, maybe over a year ago, Dr. Benjamine Mola.  Has hole in right ear drum.   No other aggravating or relieving factors. No other complaint. ? ?Past Medical History:  ?Diagnosis Date  ? Anxiety   ? Cystitis, chronic   ? Gross hematuria   ? High cholesterol   ? Hypertension   ? Obesity   ? ?Current Outpatient Medications on File Prior to Visit   ?Medication Sig Dispense Refill  ? ALPRAZolam (XANAX) 0.5 MG tablet Take 1 tablet (0.5 mg total) by mouth daily as needed for anxiety. 15 tablet 5  ? atorvastatin (LIPITOR) 10 MG tablet Take 1 tablet (10 mg total) by mouth daily. 90 tablet 0  ? citalopram (CELEXA) 20 MG tablet Take 1 and 1/2 tablets (30 mg total) by mouth daily. 135 tablet 0  ? Docusate Calcium (STOOL SOFTENER PO) Take by mouth.    ? fluticasone (FLONASE) 50 MCG/ACT nasal spray Place 2 sprays into both nostrils daily. 16 g 6  ? lisinopril (ZESTRIL) 20 MG tablet Take 1/2 tablet by mouth every morning and 1 tablet by mouth at bedtime 135 tablet 3  ? metFORMIN (GLUCOPHAGE) 500 MG tablet Take 1 tablet (500 mg total) by mouth 2 (two) times daily. 180 tablet 0  ? nitrofurantoin, macrocrystal-monohydrate, (MACROBID) 100 MG capsule Take 1 capsule (100 mg total) by mouth 2 (two) times daily. 14 capsule 0  ? omeprazole (PRILOSEC) 20 MG capsule Take 1 capsule (20 mg total) by mouth daily. 90 capsule 1  ? Semaglutide,0.25 or 0.'5MG'$ /DOS, (OZEMPIC, 0.25 OR 0.5 MG/DOSE,) 2 MG/1.5ML SOPN Inject 0.5 mg into the skin once a week. 1.5 mL 1  ? ?No current facility-administered medications on file prior to visit.  ? ?Review of Systems ?As in subjective ?   ?Objective:  ? Physical Exam ?Due to coronavirus  pandemic stay at home measures, patient visit was virtual and they were not examined in person.   ?Gen: wd, wn, nad ?No wheezing or labored breathing ? ?   ?Assessment:  ?   ?Encounter Diagnoses  ?Name Primary?  ? Allergic rhinitis due to pollen, unspecified seasonality Yes  ? Itchy eyes   ? Head congestion   ? ? ?   ?Plan:  ?   ?Symptoms suggest more of an allergic rhinitis picture versus a sinus infection. ? ?I recommend you begin over-the-counter allergy pill at bedtime such as Zyrtec or Allegra for the next several weeks.  This medicine alone [place] help to significantly improve your symptoms ? ?However if need be, you can add Pataday allergy eyedrops over-the-counter  for itchy watery eyes ? ?For significant stuffy nose and congestion in the head you can use Flonase nasal spray over-the-counter daily ? ?Consider nasal saline flush before bedtime and in the morning to flush out mucus from the sinuses and nostrils ? ?Drink plenty of water daily ? ?If changes in symptoms or not improving call or recheck.  If significant head pressure, upper teeth pain, thick yellow-green mucus different than what you have now then call back ? ? ?[name] was seen today for possible sinus infection. ? ?Diagnoses and all orders for this visit: ? ?Allergic rhinitis due to pollen, unspecified seasonality ? ?Itchy eyes ? ?Head congestion ? ?F/u prn ? ?

## 2021-12-01 ENCOUNTER — Other Ambulatory Visit (HOSPITAL_COMMUNITY): Payer: Self-pay

## 2021-12-01 ENCOUNTER — Ambulatory Visit (INDEPENDENT_AMBULATORY_CARE_PROVIDER_SITE_OTHER): Payer: No Typology Code available for payment source | Admitting: Medical

## 2021-12-01 VITALS — BP 120/70 | HR 74 | Wt 220.6 lb

## 2021-12-01 DIAGNOSIS — K219 Gastro-esophageal reflux disease without esophagitis: Secondary | ICD-10-CM | POA: Diagnosis not present

## 2021-12-01 DIAGNOSIS — E7800 Pure hypercholesterolemia, unspecified: Secondary | ICD-10-CM

## 2021-12-01 DIAGNOSIS — Z6841 Body Mass Index (BMI) 40.0 and over, adult: Secondary | ICD-10-CM | POA: Insufficient documentation

## 2021-12-01 DIAGNOSIS — R7301 Impaired fasting glucose: Secondary | ICD-10-CM

## 2021-12-01 DIAGNOSIS — I10 Essential (primary) hypertension: Secondary | ICD-10-CM

## 2021-12-01 MED ORDER — WEGOVY 0.5 MG/0.5ML ~~LOC~~ SOAJ
0.5000 mg | SUBCUTANEOUS | 0 refills | Status: DC
  Filled 2021-12-01 – 2021-12-11 (×3): qty 2, 28d supply, fill #0

## 2021-12-01 NOTE — Assessment & Plan Note (Signed)
Continue current medication, recheck for fasting labs and physical in a month ?

## 2021-12-01 NOTE — Assessment & Plan Note (Signed)
Compliant with medication, return in about a month for fasting physical ?

## 2021-12-01 NOTE — Progress Notes (Signed)
Subjective: ? Jessica [name] is a 51 y.o. female who presents for ?Chief Complaint  ?Patient presents with  ? discuss weight loss medication  ?  Discuss weight loss medication. Ozempic wasn't working for you. Never titrated up from .'5mg'$   ?   ?Here for concerns about weight.  She has been using Ozempic to help with weight loss.  Initially it seemed to go okay but she was never titrated up from 0.5 mg.  After point she did not seem to have any improvements and weight loss.  She is active walking.  She is trying to get her phone to figure out how to track steps.   ? ?Prior medications other than Ozempic for weight loss including phentermine.  She did okay on this.  She does not recall any side effects from the medication. ? ?She is proud of herself that she has not drank any soft drinks since New Year's 2023. ? ?Hypertension-compliant with medication ? ?Hyperlipidemia-compliant with medication ? ? ?No other aggravating or relieving factors.   ? ?No other c/o. ? ? ?Past Medical History:  ?Diagnosis Date  ? Anxiety   ? Cystitis, chronic   ? Gross hematuria   ? High cholesterol   ? Hypertension   ? Obesity   ? ?Current Outpatient Medications on File Prior to Visit  ?Medication Sig Dispense Refill  ? ALPRAZolam (XANAX) 0.5 MG tablet Take 1 tablet (0.5 mg total) by mouth daily as needed for anxiety. 15 tablet 5  ? atorvastatin (LIPITOR) 10 MG tablet Take 1 tablet (10 mg total) by mouth daily. 90 tablet 0  ? citalopram (CELEXA) 20 MG tablet Take 1 and 1/2 tablets (30 mg total) by mouth daily. 135 tablet 0  ? Docusate Calcium (STOOL SOFTENER PO) Take by mouth.    ? fluticasone (FLONASE) 50 MCG/ACT nasal spray Place 2 sprays into both nostrils daily. 16 g 6  ? lisinopril (ZESTRIL) 20 MG tablet Take 1/2 tablet by mouth every morning and 1 tablet by mouth at bedtime 135 tablet 3  ? metFORMIN (GLUCOPHAGE) 500 MG tablet Take 1 tablet (500 mg total) by mouth 2 (two) times daily. 180 tablet 0  ? omeprazole (PRILOSEC) 20 MG capsule Take  1 capsule (20 mg total) by mouth daily. 90 capsule 1  ? ?No current facility-administered medications on file prior to visit.  ? ? ? ?The following portions of the patient's history were reviewed and updated as appropriate: allergies, current medications, past family history, past medical history, past social history, past surgical history and problem list. ? ?ROS ?Otherwise as in subjective above ? ?Objective: ?BP 120/70   Pulse 74   Wt 220 lb 9.6 oz (100.1 kg)   BMI 41.68 kg/m?  ? ?Wt Readings from Last 3 Encounters:  ?12/01/21 220 lb 9.6 oz (100.1 kg)  ?07/30/21 223 lb (101.2 kg)  ?05/20/21 223 lb 9.6 oz (101.4 kg)  ? ?BP Readings from Last 3 Encounters:  ?12/01/21 120/70  ?05/20/21 120/80  ?11/26/20 (!) 138/96  ? ? ?General appearance: alert, no distress, well developed, well nourished ?Neck: supple, no lymphadenopathy, no thyromegaly, no masses ?Heart: RRR, normal S1, S2, no murmurs ?Lungs: CTA bilaterally, no wheezes, rhonchi, or rales ?Pulses: 2+ radial pulses, 2+ pedal pulses, normal cap refill ?Ext: no edema ? ? ?Assessment: ?Encounter Diagnoses  ?Name Primary?  ? Impaired fasting glucose Yes  ? Essential hypertension   ? Gastroesophageal reflux disease without esophagitis   ? Pure hypercholesterolemia   ? BMI 40.0-44.9, adult ([place])   ? ? ? ?  Plan: ?Problem List Items Addressed This Visit   ? ? Essential hypertension  ?  Continue current medication, recheck for fasting labs and physical in a month ? ?  ?  ? Gastroesophageal reflux disease without esophagitis  ? Pure hypercholesterolemia  ?  Compliant with medication, return in about a month for fasting physical ? ?  ?  ? BMI 40.0-44.9, adult ([place])  ?  We discussed her underlying health issues.  She has done okay on the Ozempic but felt like the dose needs to be titrated up.  Given that she has prediabetes we Zarzycki need to switch to a different medication.  Begin trial of Wegovy at the second dose.  Discussed risk and benefits and proper use of medication.   We will plan to go up titrate up monthly.  Counseled on exercise, congratulated her on not drinking any soda since January 2023.  Continue to lose weight through healthy diet and exercise measures. ?  ?  ? Relevant Medications  ? Semaglutide-Weight Management (WEGOVY) 0.5 MG/0.5ML SOAJ  ? Impaired fasting glucose - Primary  ? ? ?Follow up: 4 to 6 weeks for fasting physical. ?

## 2021-12-01 NOTE — Assessment & Plan Note (Signed)
We discussed her underlying health issues.  She has done okay on the Ozempic but felt like the dose needs to be titrated up.  Given that she has prediabetes we [place] need to switch to a different medication.  Begin trial of Wegovy at the second dose.  Discussed risk and benefits and proper use of medication.  We will plan to go up titrate up monthly.  Counseled on exercise, congratulated her on not drinking any soda since January 2023.  Continue to lose weight through healthy diet and exercise measures. ?

## 2021-12-02 ENCOUNTER — Ambulatory Visit (INDEPENDENT_AMBULATORY_CARE_PROVIDER_SITE_OTHER): Payer: No Typology Code available for payment source

## 2021-12-02 ENCOUNTER — Other Ambulatory Visit: Payer: Self-pay | Admitting: Medical

## 2021-12-02 ENCOUNTER — Ambulatory Visit (INDEPENDENT_AMBULATORY_CARE_PROVIDER_SITE_OTHER): Payer: No Typology Code available for payment source | Admitting: Orthopaedic Surgery

## 2021-12-02 DIAGNOSIS — M79673 Pain in unspecified foot: Secondary | ICD-10-CM

## 2021-12-02 DIAGNOSIS — M79671 Pain in right foot: Secondary | ICD-10-CM

## 2021-12-02 NOTE — Progress Notes (Signed)
? ?Office Visit Note ?  ?Patient: Jessica [name]           ?Date of Birth: 03-11-1971           ?MRN: [id number] ?Visit Date: 12/02/2021 ?             ?Requested by: Jessica Hurl, PA-C ?[address] ?[place],  [place] [address] ?PCP: Jessica Hurl, PA-C ? ? ?Assessment & Plan: ?Visit Diagnoses:  ?1. Pain in right foot   ? ? ?Plan: Impression is acute strain to the right posterior tibial tendon insertion.  She does have an ossicle on x-ray but she has a similar one in the left foot as well.  Posterior tibial tendon function is intact.  We will immobilize in a cam boot for couple weeks and then wean as tolerated.  She did feel immediate improvement in her symptoms after being placed in the cam boot. ? ?Follow-Up Instructions: No follow-ups on file.  ? ?Orders:  ?Orders Placed This Encounter  ?Procedures  ? XR Foot Complete Right  ? ?No orders of the defined types were placed in this encounter. ? ? ? ? Procedures: ?No procedures performed ? ? ?Clinical Data: ?No additional findings. ? ? ?Subjective: ?Chief Complaint  ?Patient presents with  ? Right Foot - Pain  ? ? ?HPI ? 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  She stepped in a hole and has felt weird discomfort to the medial side.  This is tender to touch.  Pain with ambulation. ? ?Review of Systems ? ? ?Objective: ?Vital Signs: There were no vitals taken for this visit. ? ?Physical Exam ? ?Ortho Exam ? ?Examination of right foot shows no significant swelling or any evidence of bruising.  She has tenderness at the insertion of the posterior tibial tendon.  She is able to do a single heel raise without any weakness or significant discomfort.  Achilles tendon and plantar fascia and peroneal tendons are all unremarkable. ? ?Specialty Comments:  ?No specialty comments available. ? ?Imaging: ?XR Foot Complete Right ? ?Result Date: 12/02/2021 ?No acute or structural  abnormalities.  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 ? ?PMFS History: ?Patient Active Problem List  ? Diagnosis Date Noted  ? BMI 40.0-44.9, adult ([place]) 12/01/2021  ? Impaired fasting glucose 12/01/2021  ? History of urinary tract infection 08/29/2021  ? COVID-19 virus infection 04/09/2020  ? Hematuria 09/13/2019  ? Routine general medical examination at a health care facility 03/06/2019  ? Pure hypercholesterolemia 03/06/2019  ? Other fatigue 03/06/2019  ? Family history of skin cancer 03/06/2019  ? Morbid obesity ([place]) 09/23/2018  ? Back pain 06/20/2018  ? Right otitis media with effusion 06/20/2018  ? Prediabetes 11/10/2017  ? Gastroesophageal reflux disease without esophagitis 11/05/2017  ? Essential hypertension 10/24/2010  ? Anxiety 10/24/2010  ? ?Past Medical History:  ?Diagnosis Date  ? Anxiety   ? Cystitis, chronic   ? Gross hematuria   ? High cholesterol   ? Hypertension   ? Obesity   ?  ?Family History  ?Problem Relation Age of Onset  ? Hyperlipidemia Mother   ? Diabetes Father   ? Hypertension Father   ? Stroke Father 33  ? Cancer Paternal Grandfather   ?     skin  ? Hypertension Paternal Grandmother   ? Diabetes Paternal Grandmother   ? Cancer Maternal Grandmother   ?  ovarian  ?  ?Past Surgical History:  ?Procedure Laterality Date  ? ABDOMINAL HYSTERECTOMY    ? ADENOIDECTOMY    ? CARPAL TUNNEL RELEASE    ? TONSILLECTOMY    ? TOTAL ABDOMINAL HYSTERECTOMY    ? ?Social History  ? ?Occupational History  ? Not on file  ?Tobacco Use  ? Smoking status: Former  ?  Types: Cigarettes  ?  Quit date: 06/06/2016  ?  Years since quitting: 5.4  ? Smokeless tobacco: Never  ?Substance and Sexual Activity  ? Alcohol use: No  ? Drug use: No  ? Sexual activity: Yes  ?  Birth control/protection: Surgical  ? ? ? ? ? ? ?

## 2021-12-03 ENCOUNTER — Other Ambulatory Visit (HOSPITAL_COMMUNITY): Payer: Self-pay

## 2021-12-11 ENCOUNTER — Other Ambulatory Visit (HOSPITAL_COMMUNITY): Payer: Self-pay

## 2021-12-12 NOTE — Telephone Encounter (Signed)
Working on PA

## 2021-12-13 ENCOUNTER — Telehealth: Payer: Self-pay

## 2021-12-13 NOTE — Telephone Encounter (Signed)
P.A. WEGOVY 

## 2021-12-18 NOTE — Telephone Encounter (Signed)
Additional questions answered and faxed for P.A.

## 2021-12-23 ENCOUNTER — Ambulatory Visit: Payer: No Typology Code available for payment source | Admitting: Medical

## 2021-12-25 MED ORDER — WEGOVY 0.5 MG/0.5ML ~~LOC~~ SOAJ: 0.5000 mg | SUBCUTANEOUS | 0 refills | Status: DC

## 2021-12-27 NOTE — Telephone Encounter (Signed)
P.A. approved til 07/17/22, sent mychart message

## 2021-12-31 ENCOUNTER — Other Ambulatory Visit: Payer: Self-pay

## 2022-01-01 ENCOUNTER — Other Ambulatory Visit: Payer: Self-pay | Admitting: Medical

## 2022-01-01 ENCOUNTER — Other Ambulatory Visit (HOSPITAL_COMMUNITY): Payer: Self-pay

## 2022-01-01 MED ORDER — WEGOVY 1.7 MG/0.75ML ~~LOC~~ SOAJ: 1.7000 mg | SUBCUTANEOUS | 1 refills | Status: DC

## 2022-01-01 MED ORDER — WEGOVY 1.7 MG/0.75ML ~~LOC~~ SOAJ
1.7000 mg | SUBCUTANEOUS | 1 refills | Status: DC
  Filled 2022-01-01: qty 3, 28d supply, fill #0
  Filled 2022-01-13: qty 3, 28d supply, fill #1

## 2022-01-08 ENCOUNTER — Other Ambulatory Visit (HOSPITAL_COMMUNITY): Payer: Self-pay

## 2022-01-08 ENCOUNTER — Other Ambulatory Visit: Payer: Self-pay | Admitting: Medical

## 2022-01-08 DIAGNOSIS — G4700 Insomnia, unspecified: Secondary | ICD-10-CM

## 2022-01-08 DIAGNOSIS — E7800 Pure hypercholesterolemia, unspecified: Secondary | ICD-10-CM

## 2022-01-08 DIAGNOSIS — F419 Anxiety disorder, unspecified: Secondary | ICD-10-CM

## 2022-01-08 MED ORDER — ATORVASTATIN CALCIUM 10 MG PO TABS
10.0000 mg | ORAL_TABLET | Freq: Every day | ORAL | 0 refills | Status: DC
  Filled 2022-01-08: qty 90, 90d supply, fill #0

## 2022-01-08 MED ORDER — CITALOPRAM HYDROBROMIDE 20 MG PO TABS
30.0000 mg | ORAL_TABLET | Freq: Every day | ORAL | 0 refills | Status: DC
  Filled 2022-01-08: qty 135, 90d supply, fill #0

## 2022-01-13 ENCOUNTER — Other Ambulatory Visit (HOSPITAL_COMMUNITY): Payer: Self-pay

## 2022-01-15 ENCOUNTER — Ambulatory Visit (INDEPENDENT_AMBULATORY_CARE_PROVIDER_SITE_OTHER): Payer: No Typology Code available for payment source | Admitting: Medical

## 2022-01-15 ENCOUNTER — Encounter: Payer: Self-pay | Admitting: Medical

## 2022-01-15 VITALS — BP 130/80 | HR 75 | Wt 220.0 lb

## 2022-01-15 DIAGNOSIS — R5383 Other fatigue: Secondary | ICD-10-CM

## 2022-01-15 DIAGNOSIS — R7301 Impaired fasting glucose: Secondary | ICD-10-CM

## 2022-01-15 DIAGNOSIS — E7800 Pure hypercholesterolemia, unspecified: Secondary | ICD-10-CM

## 2022-01-15 DIAGNOSIS — K5909 Other constipation: Secondary | ICD-10-CM

## 2022-01-15 DIAGNOSIS — R7303 Prediabetes: Secondary | ICD-10-CM

## 2022-01-15 DIAGNOSIS — Z87440 Personal history of urinary (tract) infections: Secondary | ICD-10-CM

## 2022-01-15 DIAGNOSIS — F419 Anxiety disorder, unspecified: Secondary | ICD-10-CM

## 2022-01-15 DIAGNOSIS — Z0000 Encounter for general adult medical examination without abnormal findings: Secondary | ICD-10-CM | POA: Diagnosis not present

## 2022-01-15 DIAGNOSIS — H02401 Unspecified ptosis of right eyelid: Secondary | ICD-10-CM | POA: Insufficient documentation

## 2022-01-15 DIAGNOSIS — I10 Essential (primary) hypertension: Secondary | ICD-10-CM

## 2022-01-15 DIAGNOSIS — R400 Somnolence: Secondary | ICD-10-CM

## 2022-01-15 DIAGNOSIS — K219 Gastro-esophageal reflux disease without esophagitis: Secondary | ICD-10-CM

## 2022-01-15 DIAGNOSIS — Z808 Family history of malignant neoplasm of other organs or systems: Secondary | ICD-10-CM

## 2022-01-15 DIAGNOSIS — K5790 Diverticulosis of intestine, part unspecified, without perforation or abscess without bleeding: Secondary | ICD-10-CM | POA: Insufficient documentation

## 2022-01-15 DIAGNOSIS — Z6841 Body Mass Index (BMI) 40.0 and over, adult: Secondary | ICD-10-CM

## 2022-01-15 DIAGNOSIS — I700 Atherosclerosis of aorta: Secondary | ICD-10-CM

## 2022-01-15 NOTE — Progress Notes (Signed)
Subjective:   HPI  Jessica [name] is a 51 y.o. female who presents for Chief Complaint  Patient presents with   Annual Exam    Fasting CPE- [facility]    Patient Care Team: Kaycee Haycraft, Camelia Eng, PA-C as PCP - General (Family Medicine) Hinda Lenis (Dermatology) Dr. Ronnald Ramp and Dr. France Ravens, dentist [facility] Dr. Frankey Shown, orthopedics [facility] gynecology   Concerns: Despirte good hours of sleep, still feels sleepy often, fatigued.  Husband snores so not sure if she snores or not.    Getting some weird dreams as well  On week 2 of current dose of Wegovy . Still working on effors to lose weight through healthy diet and exercise. Sleepy , tired,    Reviewed their medical, surgical, family, social, medication, and allergy history and updated chart as appropriate.  Past Medical History:  Diagnosis Date   Anxiety    Cystitis, chronic    GERD (gastroesophageal reflux disease)    Gross hematuria    years ago, saw urology   High cholesterol    Hypertension    Obesity     Family History  Problem Relation Age of Onset   Hyperlipidemia Mother    Heart disease Father    Diabetes Father    Hypertension Father    Stroke Father 73   Heart failure Father    Cancer Maternal Grandmother        ovarian   Hypertension Paternal Grandmother    Diabetes Paternal Grandmother    Cancer Paternal Grandfather        skin     Current Outpatient Medications:    ALPRAZolam (XANAX) 0.5 MG tablet, Take 1 tablet (0.5 mg total) by mouth daily as needed for anxiety., Disp: 15 tablet, Rfl: 5   atorvastatin (LIPITOR) 10 MG tablet, Take 1 tablet (10 mg total) by mouth daily., Disp: 90 tablet, Rfl: 0   citalopram (CELEXA) 20 MG tablet, Take 1 and 1/2 tablets (30 mg total) by mouth daily., Disp: 135 tablet, Rfl: 0   Docusate Calcium (STOOL SOFTENER PO), Take by mouth., Disp: , Rfl:    fluticasone (FLONASE) 50 MCG/ACT nasal spray, Place 2 sprays into both nostrils daily.,  Disp: 16 g, Rfl: 6   lisinopril (ZESTRIL) 20 MG tablet, Take 1/2 tablet by mouth every morning and 1 tablet by mouth at bedtime, Disp: 135 tablet, Rfl: 3   metFORMIN (GLUCOPHAGE) 500 MG tablet, Take 1 tablet (500 mg total) by mouth 2 (two) times daily., Disp: 180 tablet, Rfl: 0   omeprazole (PRILOSEC) 20 MG capsule, Take 1 capsule (20 mg total) by mouth daily., Disp: 90 capsule, Rfl: 1   Semaglutide-Weight Management (WEGOVY) 1.7 MG/0.75ML SOAJ, Inject 1.7 mg into the skin once a week., Disp: 3 mL, Rfl: 1  Allergies  Allergen Reactions   Hctz [Hydrochlorothiazide] Photosensitivity   Penicillins Itching    Can tolerate cephalosporins.   Vioxx [Rofecoxib] Itching      Review of Systems Constitutional: -fever, -chills, -sweats, -unexpected weight change, -decreased appetite, +fatigue Allergy: -sneezing, -itching, -congestion Dermatology: -changing moles, --rash, -lumps ENT: -runny nose, -ear pain, -sore throat, -hoarseness, -sinus pain, -teeth pain, - ringing in ears, -hearing loss, -nosebleeds Cardiology: -chest pain, -palpitations, -swelling, -difficulty breathing when lying flat, -waking up short of breath Respiratory: -cough, -shortness of breath, -difficulty breathing with exercise or exertion, -wheezing, -coughing up blood Gastroenterology: -abdominal pain, -nausea, -vomiting, -diarrhea, -constipation, -blood in stool, -changes in bowel movement, -difficulty swallowing or eating  Hematology: -bleeding, -bruising  Musculoskeletal: -joint aches, -muscle aches, -joint swelling, -back pain, -neck pain, -cramping, -changes in gait Ophthalmology: denies vision changes, eye redness, itching, discharge Urology: -burning with urination, -difficulty urinating, -blood in urine, -urinary frequency, -urgency, -incontinence Neurology: -headache, -weakness, -tingling, -numbness, -memory loss, -falls, -dizziness Psychology: -depressed mood, -agitation, +sleep problems Breast/gyn: -breast tendnerss,  -discharge, -lumps, -vaginal discharge,- irregular periods, -heavy periods      01/15/2022   12:08 PM 08/29/2021    1:58 PM 05/20/2021    3:32 PM 04/07/2021    2:47 PM 11/26/2020   10:17 AM  Depression screen PHQ 2/9  Decreased Interest 0 0 0 0 0  Down, Depressed, Hopeless 0 0 0 0 0  PHQ - 2 Score 0 0 0 0 0       Objective:  BP 130/80   Pulse 75   Wt 220 lb (99.8 kg)   SpO2 98%   BMI 41.57 kg/m   BP Readings from Last 3 Encounters:  01/15/22 130/80  12/01/21 120/70  05/20/21 120/80   Wt Readings from Last 3 Encounters:  01/15/22 220 lb (99.8 kg)  12/01/21 220 lb 9.6 oz (100.1 kg)  07/30/21 223 lb (101.2 kg)    General appearance: alert, no distress, WD/WN, Caucasian female Skin: unremarkable HEENT: normocephalic, conjunctiva/corneas normal, sclerae anicteric, PERRLA, EOMi, nares patent, no discharge or erythema, pharynx normal Oral cavity: MMM, tongue normal, teeth in good repair Neck: supple, no lymphadenopathy, no thyromegaly, no masses, normal ROM, no bruits Chest: non tender, normal shape and expansion Heart: RRR, normal S1, S2, no murmurs Lungs: CTA bilaterally, no wheezes, rhonchi, or rales Abdomen: +bs, soft, non tender, non distended, no masses, no hepatomegaly, no splenomegaly, no bruits Back: non tender, normal ROM, no scoliosis Musculoskeletal: upper extremities non tender, no obvious deformity, normal ROM throughout, lower extremities non tender, no obvious deformity, normal ROM throughout Extremities: no edema, no cyanosis, no clubbing Pulses: 2+ symmetric, upper and lower extremities, normal cap refill Neurological: alert, oriented x 3, CN2-12 intact, strength normal upper extremities and lower extremities, sensation normal throughout, DTRs 2+ throughout, no cerebellar signs, gait normal Psychiatric: normal affect, behavior normal, pleasant  breast/gyn/rectal - deferred to gynecology     Assessment and Plan :   Encounter Diagnoses  Name Primary?    Routine general medical examination at a health care facility Yes   Pure hypercholesterolemia    Prediabetes    Impaired fasting glucose    History of urinary tract infection    Gastroesophageal reflux disease without esophagitis    Family history of skin cancer    Essential hypertension    BMI 40.0-44.9, adult (HCC)    Aortic atherosclerosis (HCC)    Anxiety    Diverticulosis    Chronic constipation    Ptosis of right eyelid    Other fatigue    Daytime somnolence      This visit was a preventative care visit, also known as wellness visit or routine physical.   Topics typically include healthy lifestyle, diet, exercise, preventative care, vaccinations, sick and well care, proper use of emergency dept and after hours care, as well as other concerns.     Recommendations: Continue to return yearly for your annual wellness and preventative care visits.  This gives [place] a chance to discuss healthy lifestyle, exercise, vaccinations, review your chart record, and perform screenings where appropriate.  I recommend you see your eye doctor yearly for routine vision care.  I recommend you see your dentist yearly for  routine dental care including hygiene visits twice yearly.  See your gynecologist yearly for routine gynecological care.    Vaccination recommendations were reviewed Immunization History  Administered Date(s) Administered   Influenza,inj,Quad PF,6+ Mos 05/22/2020   Influenza-Unspecified 02/24/2018, 04/20/2019, 05/01/2021   MMR 06/11/2017, 07/13/2017   PFIZER(Purple Top)SARS-COV-2 Vaccination 03/28/2020, 05/03/2020, 07/24/2020   Td 01/25/2004   Tdap 06/10/2017      Screening for cancer: Colon cancer screening: We will refer you for screening colonoscopy  Breast cancer screening: You should perform a self breast exam monthly.   We reviewed recommendations for regular mammograms and breast cancer screening.  Cervical cancer screening: We reviewed recommendations for  pap smear screening.   Skin cancer screening: Check your skin regularly for new changes, growing lesions, or other lesions of concern Come in for evaluation if you have skin lesions of concern.  Lung cancer screening: If you have a greater than 20 pack year history of tobacco use, then you Arnaud qualify for lung cancer screening with a chest CT scan.   Please call your insurance company to inquire about coverage for this test.  We currently don't have screenings for other cancers besides breast, cervical, colon, and lung cancers.  If you have a strong family history of cancer or have other cancer screening concerns, please let me know.    Bone health: Get at least 150 minutes of aerobic exercise weekly Get weight bearing exercise at least once weekly Bone density test:  A bone density test is an imaging test that uses a type of X-ray to measure the amount of calcium and other minerals in your bones. The test Shadowens be used to diagnose or screen you for a condition that causes weak or thin bones (osteoporosis), predict your risk for a broken bone (fracture), or determine how well your osteoporosis treatment is working. The bone density test is recommended for females 79 and older, or females or males <62 if certain risk factors such as thyroid disease, long term use of steroids such as for asthma or rheumatological issues, vitamin D deficiency, estrogen deficiency, family history of osteoporosis, self or family history of fragility fracture in first degree relative.    Heart health: Get at least 150 minutes of aerobic exercise weekly Limit alcohol It is important to maintain a healthy blood pressure and healthy cholesterol numbers  Heart disease screening: Screening for heart disease includes screening for blood pressure, fasting lipids, glucose/diabetes screening, BMI height to weight ratio, reviewed of smoking status, physical activity, and diet.    Goals include blood pressure 120/80 or  less, maintaining a healthy lipid/cholesterol profile, preventing diabetes or keeping diabetes numbers under good control, not smoking or using tobacco products, exercising most days per week or at least 150 minutes per week of exercise, and eating healthy variety of fruits and vegetables, healthy oils, and avoiding unhealthy food choices like fried food, fast food, high sugar and high cholesterol foods.    Other tests [place] possibly include EKG test, CT coronary calcium score, echocardiogram, exercise treadmill stress test.     Medical care options: I recommend you continue to seek care here first for routine care.  We try really hard to have available appointments Monday through Friday daytime hours for sick visits, acute visits, and physicals.  Urgent care should be used for after hours and weekends for significant issues that cannot wait till the next day.  The emergency department should be used for significant potentially life-threatening emergencies.  The emergency department is expensive,  can often have long wait times for less significant concerns, so try to utilize primary care, urgent care, or telemedicine when possible to avoid unnecessary trips to the emergency department.  Virtual visits and telemedicine have been introduced since the pandemic started in 2020, and can be convenient ways to receive medical care.  We offer virtual appointments as well to assist you in a variety of options to seek medical care.   Advanced Directives: I recommend you consider completing a [employer] and Living Will.   These documents respect your wishes and help alleviate burdens on your loved ones if you were to become terminally ill or be in a position to need those documents enforced.    You can complete Advanced Directives yourself, have them notarized, then have copies made for our office, for you and for anybody you feel should have them in safe keeping.  Or, you can have an attorney  prepare these documents.   If you haven't updated your Last Will and Testament in a while, it Bruun be worthwhile having an attorney prepare these documents together and save on some costs.      Separate significant issues discussed: Fatigue, somnolence - consider sleep study, continue efforts to lose weight  Obesity - continue efforts to lose weight through health diet and exercise, wegovy  Impaired glucose-updated labs today  Hyperlipidemia-continue statin, updated labs today  Atherosclerosis noted on prior scan-continue statin, low-cholesterol diet  Diverticulitis noted on prior scan-follow-up with gastroenterology for colonoscopy  Ptosis-discussed possible causes.  Likely related to prediabetes.  Follow-up pending labs.  Can consider neurology  Chronic constipation-follow-up with gastroenterology   Jalexis was seen today for annual exam.  Diagnoses and all orders for this visit:  Routine general medical examination at a health care facility -     Ambulatory referral to Gastroenterology -     Comprehensive metabolic panel -     CBC -     Hemoglobin A1c -     Urinalysis -     Lipid panel  Pure hypercholesterolemia -     Lipid panel  Prediabetes -     Hemoglobin A1c  Impaired fasting glucose -     Hemoglobin A1c  History of urinary tract infection -     Urinalysis  Gastroesophageal reflux disease without esophagitis  Family history of skin cancer  Essential hypertension  BMI 40.0-44.9, adult (HCC)  Aortic atherosclerosis (HCC)  Anxiety  Diverticulosis  Chronic constipation  Ptosis of right eyelid  Other fatigue  Daytime somnolence    Follow-up pending labs, yearly for physical

## 2022-01-16 ENCOUNTER — Other Ambulatory Visit: Payer: Self-pay | Admitting: Medical

## 2022-01-16 DIAGNOSIS — R7303 Prediabetes: Secondary | ICD-10-CM

## 2022-01-16 DIAGNOSIS — K219 Gastro-esophageal reflux disease without esophagitis: Secondary | ICD-10-CM

## 2022-01-16 LAB — HEMOGLOBIN A1C
Est. average glucose Bld gHb Est-mCnc: 157 mg/dL
Hgb A1c MFr Bld: 7.1 % — ABNORMAL HIGH (ref 4.8–5.6)

## 2022-01-16 LAB — CBC
Hematocrit: 37 % (ref 34.0–46.6)
Hemoglobin: 12.6 g/dL (ref 11.1–15.9)
MCH: 29.3 pg (ref 26.6–33.0)
MCHC: 34.1 g/dL (ref 31.5–35.7)
MCV: 86 fL (ref 79–97)
Platelets: 322 10*3/uL (ref 150–450)
RBC: 4.3 x10E6/uL (ref 3.77–5.28)
RDW: 12.7 % (ref 11.7–15.4)
WBC: 10 10*3/uL (ref 3.4–10.8)

## 2022-01-16 LAB — URINALYSIS
Bilirubin, UA: NEGATIVE
Glucose, UA: NEGATIVE
Ketones, UA: NEGATIVE
Leukocytes,UA: NEGATIVE
Nitrite, UA: NEGATIVE
RBC, UA: NEGATIVE
Specific Gravity, UA: 1.028 (ref 1.005–1.030)
Urobilinogen, Ur: 0.2 mg/dL (ref 0.2–1.0)
pH, UA: 5.5 (ref 5.0–7.5)

## 2022-01-16 LAB — COMPREHENSIVE METABOLIC PANEL
ALT: 24 IU/L (ref 0–32)
AST: 17 IU/L (ref 0–40)
Albumin/Globulin Ratio: 1.7 (ref 1.2–2.2)
Albumin: 4.5 g/dL (ref 3.8–4.8)
Alkaline Phosphatase: 103 IU/L (ref 44–121)
BUN/Creatinine Ratio: 14 (ref 9–23)
BUN: 9 mg/dL (ref 6–24)
Bilirubin Total: 0.3 mg/dL (ref 0.0–1.2)
CO2: 21 mmol/L (ref 20–29)
Calcium: 9.2 mg/dL (ref 8.7–10.2)
Chloride: 102 mmol/L (ref 96–106)
Creatinine, Ser: 0.64 mg/dL (ref 0.57–1.00)
Globulin, Total: 2.7 g/dL (ref 1.5–4.5)
Glucose: 87 mg/dL (ref 70–99)
Potassium: 4.1 mmol/L (ref 3.5–5.2)
Sodium: 139 mmol/L (ref 134–144)
Total Protein: 7.2 g/dL (ref 6.0–8.5)
eGFR: 109 mL/min/{1.73_m2} (ref >59)

## 2022-01-16 LAB — LIPID PANEL
Chol/HDL Ratio: 3.7 ratio (ref 0.0–4.4)
Cholesterol, Total: 150 mg/dL (ref 100–199)
HDL: 41 mg/dL (ref >39)
LDL Chol Calc (NIH): 90 mg/dL (ref 0–99)
Triglycerides: 102 mg/dL (ref 0–149)
VLDL Cholesterol Cal: 19 mg/dL (ref 5–40)

## 2022-01-16 MED ORDER — METFORMIN HCL 500 MG PO TABS: 500.0000 mg | ORAL_TABLET | Freq: Two times a day (BID) | ORAL | 3 refills | Status: DC

## 2022-01-16 MED ORDER — OMEPRAZOLE 20 MG PO CPDR: 20.0000 mg | DELAYED_RELEASE_CAPSULE | Freq: Every day | ORAL | 1 refills | Status: DC

## 2022-01-19 DIAGNOSIS — R400 Somnolence: Secondary | ICD-10-CM

## 2022-01-19 DIAGNOSIS — R5383 Other fatigue: Secondary | ICD-10-CM

## 2022-01-28 ENCOUNTER — Ambulatory Visit (AMBULATORY_SURGERY_CENTER): Payer: Self-pay | Admitting: *Deleted

## 2022-01-28 ENCOUNTER — Other Ambulatory Visit (HOSPITAL_COMMUNITY): Payer: Self-pay

## 2022-01-28 VITALS — Ht 61.0 in | Wt 219.6 lb

## 2022-01-28 DIAGNOSIS — Z1211 Encounter for screening for malignant neoplasm of colon: Secondary | ICD-10-CM

## 2022-01-28 MED ORDER — ONDANSETRON HCL 4 MG PO TABS: ORAL_TABLET | ORAL | 0 refills | Status: DC

## 2022-01-28 MED ORDER — ONDANSETRON HCL 4 MG PO TABS
ORAL_TABLET | ORAL | 0 refills | Status: DC
  Filled 2022-01-28: qty 2, 1d supply, fill #0

## 2022-01-28 MED ORDER — NA SULFATE-K SULFATE-MG SULF 17.5-3.13-1.6 GM/177ML PO SOLN
1.0000 | Freq: Once | ORAL | 0 refills | Status: AC
  Filled 2022-01-28: qty 354, 1d supply, fill #0

## 2022-01-28 NOTE — Progress Notes (Signed)
No egg or soy allergy known to patient  No issues known to pt with past sedation with any surgeries or procedures Patient denies ever being told they had issues or difficulty with intubation  No FH of Malignant Hyperthermia Pt is not on diet pills Pt is not on  home 02  Pt is not on blood thinners  Pt denies issues with constipation  No A fib or A flutter Have any cardiac testing pending--   Coupon to pt in PV today , Code to Pharmacy and  NO PA's for preps discussed with pt In PV today  Discussed with pt there will be an out-of-pocket cost for prep and that varies from $0 to 70 +  dollars - pt verbalized understanding  Pt instructed to use Singlecare.com or GoodRx for a price reduction on prep      Pt.stated "I stopped Mancel Parsons it made me sick last dose on 01/16/22.

## 2022-01-30 ENCOUNTER — Encounter: Payer: No Typology Code available for payment source | Admitting: Medical

## 2022-02-03 ENCOUNTER — Other Ambulatory Visit: Payer: Self-pay | Admitting: Medical

## 2022-02-03 MED ORDER — WEGOVY 0.5 MG/0.5ML ~~LOC~~ SOAJ: 0.5000 mg | SUBCUTANEOUS | 0 refills | Status: DC

## 2022-02-03 MED ORDER — WEGOVY 0.25 MG/0.5ML ~~LOC~~ SOAJ: 0.2500 mg | SUBCUTANEOUS | 0 refills | Status: DC

## 2022-02-04 ENCOUNTER — Other Ambulatory Visit: Payer: Self-pay | Admitting: Medical

## 2022-02-04 ENCOUNTER — Other Ambulatory Visit (HOSPITAL_COMMUNITY): Payer: Self-pay

## 2022-02-04 ENCOUNTER — Other Ambulatory Visit (HOSPITAL_BASED_OUTPATIENT_CLINIC_OR_DEPARTMENT_OTHER): Payer: Self-pay

## 2022-02-04 MED ORDER — WEGOVY 0.5 MG/0.5ML ~~LOC~~ SOAJ
0.5000 mg | SUBCUTANEOUS | 0 refills | Status: DC
Start: 2022-02-04 — End: 2022-04-24
  Filled 2022-02-04: qty 2, 28d supply, fill #0

## 2022-02-04 MED ORDER — WEGOVY 0.25 MG/0.5ML ~~LOC~~ SOAJ
0.2500 mg | SUBCUTANEOUS | 0 refills | Status: DC
Start: 2022-02-04 — End: 2022-03-03
  Filled 2022-02-04: qty 2, 28d supply, fill #0

## 2022-02-11 ENCOUNTER — Other Ambulatory Visit (HOSPITAL_COMMUNITY): Payer: Self-pay

## 2022-02-12 ENCOUNTER — Other Ambulatory Visit (HOSPITAL_BASED_OUTPATIENT_CLINIC_OR_DEPARTMENT_OTHER): Payer: Self-pay

## 2022-02-13 ENCOUNTER — Telehealth: Payer: Self-pay | Admitting: Internal Medicine

## 2022-02-13 NOTE — Telephone Encounter (Signed)
Sent pt a message about sleep study as she contacted me about it

## 2022-02-22 ENCOUNTER — Telehealth: Payer: No Typology Code available for payment source | Admitting: Family

## 2022-02-22 DIAGNOSIS — R399 Unspecified symptoms and signs involving the genitourinary system: Secondary | ICD-10-CM

## 2022-02-22 MED ORDER — SULFAMETHOXAZOLE-TRIMETHOPRIM 800-160 MG PO TABS: 1.0000 | ORAL_TABLET | Freq: Two times a day (BID) | ORAL | 0 refills | Status: DC

## 2022-02-22 NOTE — Progress Notes (Signed)

## 2022-02-23 ENCOUNTER — Encounter: Payer: Self-pay | Admitting: Internal Medicine

## 2022-02-24 ENCOUNTER — Ambulatory Visit (AMBULATORY_SURGERY_CENTER): Payer: No Typology Code available for payment source | Admitting: Internal Medicine

## 2022-02-24 ENCOUNTER — Encounter: Payer: Self-pay | Admitting: Internal Medicine

## 2022-02-24 VITALS — BP 147/102 | HR 77 | Temp 98.2°F | Resp 11 | Ht 61.0 in | Wt 219.6 lb

## 2022-02-24 DIAGNOSIS — Z1211 Encounter for screening for malignant neoplasm of colon: Secondary | ICD-10-CM

## 2022-02-24 DIAGNOSIS — D120 Benign neoplasm of cecum: Secondary | ICD-10-CM | POA: Diagnosis not present

## 2022-02-24 DIAGNOSIS — D124 Benign neoplasm of descending colon: Secondary | ICD-10-CM

## 2022-02-24 DIAGNOSIS — K635 Polyp of colon: Secondary | ICD-10-CM | POA: Diagnosis not present

## 2022-02-24 MED ORDER — SODIUM CHLORIDE 0.9 % IV SOLN: 500.0000 mL | Freq: Once | INTRAVENOUS | Status: DC

## 2022-02-24 NOTE — Progress Notes (Signed)
Report to PACU, RN, vss, BBS= Clear.  

## 2022-02-24 NOTE — Progress Notes (Signed)
HISTORY OF PRESENT ILLNESS:  Jessica [name] is a 51 y.o. female who presents today for screening colonoscopy.  No active complaints  REVIEW OF SYSTEMS:  All non-GI ROS negative. Past Medical History:  Diagnosis Date   Allergy    Anemia    AS A CHILD   Anxiety    Cataract    BORN WITH IT   Cystitis, chronic    GERD (gastroesophageal reflux disease)    Gross hematuria    years ago, saw urology   Heart murmur    AS A CHILD   High cholesterol    Hypertension    Obesity    Pre-diabetes     Past Surgical History:  Procedure Laterality Date   ABDOMINAL HYSTERECTOMY     uterus and 1 ovary, still has 1 ovary   ADENOIDECTOMY     CARPAL TUNNEL RELEASE Bilateral    TONSILLECTOMY     ULNAR TUNNEL RELEASE Bilateral     Social History Toby Ayad Thurgood  reports that she quit smoking about 5 years ago. Her smoking use included cigarettes. She has been exposed to tobacco smoke. She has never used smokeless tobacco. She reports that she does not drink alcohol and does not use drugs.  family history includes Cancer in her maternal grandmother and paternal grandfather; Diabetes in her father and paternal grandmother; Heart disease in her father; Heart failure in her father; Hyperlipidemia in her mother; Hypertension in her father and paternal grandmother; Stroke (age of onset: 19) in her father.  Allergies  Allergen Reactions   Hctz [Hydrochlorothiazide] Photosensitivity   Penicillins Itching    Can tolerate cephalosporins.   Vioxx [Rofecoxib] Itching       PHYSICAL EXAMINATION: Vital signs: BP (!) 156/111   Pulse 83   Temp 98.2 F (36.8 C)   Ht '5\' 1"'$  (1.549 m)   Wt 219 lb 9.6 oz (99.6 kg)   SpO2 97%   BMI 41.49 kg/m  General: Well-developed, well-nourished, no acute distress HEENT: Sclerae are anicteric, conjunctiva pink. Oral mucosa intact Lungs: Clear Heart: Regular Abdomen: soft, nontender, nondistended, no obvious ascites, no peritoneal signs, normal bowel sounds. No  organomegaly. Extremities: No edema Psychiatric: alert and oriented x3. Cooperative      ASSESSMENT:  Colon cancer screening   PLAN:   Screening colonoscopy

## 2022-02-24 NOTE — Progress Notes (Signed)
VS by [place]  Pt's states no medical or surgical changes since previsit or office visit.  

## 2022-02-24 NOTE — Op Note (Signed)
[place] Patient Name: Jessica [name] Procedure Date: 02/24/2022 8:54 AM MRN: [id number] Endoscopist: Docia Chuck. Henrene Pastor , MD Age: 51 Referring MD:  Date of Birth: 1971-02-22 Gender: Female Account #: [id number] Procedure:                Colonoscopy with cold snare polypectomy x 2 Indications:              Screening for colorectal malignant neoplasm Medicines:                Monitored Anesthesia Care Procedure:                Pre-Anesthesia Assessment:                           - Prior to the procedure, a History and Physical                            was performed, and patient medications and                            allergies were reviewed. The patient's tolerance of                            previous anesthesia was also reviewed. The risks                            and benefits of the procedure and the sedation                            options and risks were discussed with the patient.                            All questions were answered, and informed consent                            was obtained. Prior Anticoagulants: The patient has                            taken no previous anticoagulant or antiplatelet                            agents. ASA Grade Assessment: II - A patient with                            mild systemic disease. After reviewing the risks                            and benefits, the patient was deemed in                            satisfactory condition to undergo the procedure.                           After obtaining informed consent, the colonoscope  was passed under direct vision. Throughout the                            procedure, the patient's blood pressure, pulse, and                            oxygen saturations were monitored continuously. The                            CF [id number] #[id number] was introduced through the anus                            and advanced to the the cecum, identified by                             appendiceal orifice and ileocecal valve. The                            ileocecal valve, appendiceal orifice, and rectum                            were photographed. The quality of the bowel                            preparation was excellent. The colonoscopy was                            performed without difficulty. The patient tolerated                            the procedure well. The bowel preparation used was                            SUPREP via split dose instruction. Scope In: 8:59:51 AM Scope Out: 9:16:49 AM Scope Withdrawal Time: 0 hours 14 minutes 31 seconds  Total Procedure Duration: 0 hours 16 minutes 58 seconds  Findings:                 Two polyps were found in the descending colon and                            cecum. The polyps were 3 to 5 mm in size. These                            polyps were removed with a cold snare. Resection                            and retrieval were complete.                           The exam was otherwise without abnormality on                            direct and retroflexion views.  Complications:            No immediate complications. Estimated blood loss:                            None. Estimated Blood Loss:     Estimated blood loss: none. Impression:               - Two 3 to 5 mm polyps in the descending colon and                            in the cecum, removed with a cold snare. Resected                            and retrieved.                           - The examination was otherwise normal on direct                            and retroflexion views. Recommendation:           - Repeat colonoscopy in 5-10 years for surveillance.                           - Patient has a contact number available for                            emergencies. The signs and symptoms of potential                            delayed complications were discussed with the                            patient. Return to normal activities tomorrow.                             Written discharge instructions were provided to the                            patient.                           - Resume previous diet.                           - Continue present medications.                           - Await pathology results. Docia Chuck. Henrene Pastor, MD 02/24/2022 9:21:09 AM This report has been signed electronically.

## 2022-02-24 NOTE — Patient Instructions (Signed)
Handout on polyps given to patient. Await pathology results. Resume previous diet and continue present medications. Repeat colonoscopy in 5-10 years for surveillance.   YOU HAD AN ENDOSCOPIC PROCEDURE TODAY AT [facility]:   Refer to the procedure report that was given to you for any specific questions about what was found during the examination.  If the procedure report does not answer your questions, please call your gastroenterologist to clarify.  If you requested that your care partner not be given the details of your procedure findings, then the procedure report has been included in a sealed envelope for you to review at your convenience later.  YOU SHOULD EXPECT: Some feelings of bloating in the abdomen. Passage of more gas than usual.  Walking can help get rid of the air that was put into your GI tract during the procedure and reduce the bloating. If you had a lower endoscopy (such as a colonoscopy or flexible sigmoidoscopy) you Seiden notice spotting of blood in your stool or on the toilet paper. If you underwent a bowel prep for your procedure, you Denise not have a normal bowel movement for a few days.  Please Note:  You might notice some irritation and congestion in your nose or some drainage.  This is from the oxygen used during your procedure.  There is no need for concern and it should clear up in a day or so.  SYMPTOMS TO REPORT IMMEDIATELY:  Following lower endoscopy (colonoscopy or flexible sigmoidoscopy):  Excessive amounts of blood in the stool  Significant tenderness or worsening of abdominal pains  Swelling of the abdomen that is new, acute  Fever of 100F or higher   For urgent or emergent issues, a gastroenterologist can be reached at any hour by calling [phone]. Do not use MyChart messaging for urgent concerns.    DIET:  We do recommend a small meal at first, but then you Crumrine proceed to your regular diet.  Drink plenty of fluids but you should avoid  alcoholic beverages for 24 hours.  ACTIVITY:  You should plan to take it easy for the rest of today and you should NOT DRIVE or use heavy machinery until tomorrow (because of the sedation medicines used during the test).    FOLLOW UP: Our staff will call the number listed on your records the next business day following your procedure.  We will call around 7:15- 8:00 am to check on you and address any questions or concerns that you Forehand have regarding the information given to you following your procedure. If we do not reach you, we will leave a message.  If you develop any symptoms (ie: fever, flu-like symptoms, shortness of breath, cough etc.) before then, please call [phone].  If you test positive for Covid 19 in the 2 weeks post procedure, please call and report this information to [place].    If any biopsies were taken you will be contacted by phone or by letter within the next 1-3 weeks.  Please call us at [phone] if you have not heard about the biopsies in 3 weeks.    SIGNATURES/CONFIDENTIALITY: You and/or your care partner have signed paperwork which will be entered into your electronic medical record.  These signatures attest to the fact that that the information above on your After Visit Summary has been reviewed and is understood.  Full responsibility of the confidentiality of this discharge information lies with you and/or your care-partner.

## 2022-02-24 NOTE — Progress Notes (Signed)
Called to room to assist during endoscopic procedure.  Patient ID and intended procedure confirmed with present staff. Received instructions for my participation in the procedure from the performing physician.  

## 2022-02-25 ENCOUNTER — Telehealth: Payer: Self-pay | Admitting: *Deleted

## 2022-02-25 NOTE — Telephone Encounter (Signed)
No answer for post procedure callback. Unable to leave VM. 

## 2022-02-26 ENCOUNTER — Encounter: Payer: Self-pay | Admitting: Internal Medicine

## 2022-03-02 ENCOUNTER — Encounter: Payer: No Typology Code available for payment source | Admitting: Physician Assistant

## 2022-03-02 ENCOUNTER — Telehealth: Payer: No Typology Code available for payment source | Admitting: Physician Assistant

## 2022-03-02 DIAGNOSIS — R3989 Other symptoms and signs involving the genitourinary system: Secondary | ICD-10-CM

## 2022-03-02 MED ORDER — NITROFURANTOIN MONOHYD MACRO 100 MG PO CAPS: 100.0000 mg | ORAL_CAPSULE | Freq: Two times a day (BID) | ORAL | 0 refills | Status: DC

## 2022-03-02 NOTE — Addendum Note (Signed)
Addended by: Mar Daring on: 03/02/2022 07:03 PM   Modules accepted: Orders

## 2022-03-02 NOTE — Progress Notes (Signed)
Duplicate encounter

## 2022-03-02 NOTE — Telephone Encounter (Signed)
This encounter was created in error - please disregard.

## 2022-03-02 NOTE — Progress Notes (Signed)
E-Visit for Urinary Problems  We are sorry that you are not feeling well.  Here is how we plan to help!  Based on what you shared with me it looks like you most likely have a simple urinary tract infection.  A UTI (Urinary Tract Infection) is a bacterial infection of the bladder.  Most cases of urinary tract infections are simple to treat but a key part of your care is to encourage you to drink plenty of fluids and watch your symptoms carefully.  I have prescribed MacroBid 100 mg twice a day for 5 days.  Your symptoms should gradually improve. Call us if the burning in your urine worsens, you develop worsening fever, back pain or pelvic pain or if your symptoms do not resolve after completing the antibiotic.  Urinary tract infections can be prevented by drinking plenty of water to keep your body hydrated.  Also be sure when you wipe, wipe from front to back and don't hold it in!  If possible, empty your bladder every 4 hours.  HOME CARE Drink plenty of fluids Compete the full course of the antibiotics even if the symptoms resolve Remember, when you need to go.go. Holding in your urine can increase the likelihood of getting a UTI! GET HELP RIGHT AWAY IF: You cannot urinate You get a high fever Worsening back pain occurs You see blood in your urine You feel sick to your stomach or throw up You feel like you are going to pass out  MAKE SURE YOU  Understand these instructions. Will watch your condition. Will get help right away if you are not doing well or get worse.   Thank you for choosing an e-visit.  Your e-visit answers were reviewed by a board certified advanced clinical practitioner to complete your personal care plan. Depending upon the condition, your plan could have included both over the counter or prescription medications.  Please review your pharmacy choice. Make sure the pharmacy is open so you can pick up prescription now. If there is a problem, you Dunne contact your  provider through [employer] and have the prescription routed to another pharmacy.  Your safety is important to [place]. If you have drug allergies check your prescription carefully.   For the next 24 hours you can use MyChart to ask questions about today's visit, request a non-urgent call back, or ask for a work or school excuse. You will get an email in the next two days asking about your experience. I hope that your e-visit has been valuable and will speed your recovery.  I provided 5 minutes of non face-to-face time during this encounter for chart review and documentation.

## 2022-03-02 NOTE — Progress Notes (Signed)
Because you have failed first-line treatment, are having associated diarrhea, I feel your condition warrants further evaluation and I recommend that you be seen in a face to face visit. When first-line treatments are failed we recommend to be seen for a urine culture to see if there are any resistant bacteria.    NOTE: There will be NO CHARGE for this eVisit   If you are having a true medical emergency please call 911.      For an urgent face to face visit, [facility] has seven urgent care centers for your convenience:     [facility] [place] [facility] [place] Get Driving Directions [phone] [address] [place], [place] [address]    [facility]) Get Driving Directions [phone] [place], [place] [address]  [facility] [place] [facility]) Get Driving Directions [phone] [address] [place],  [place]  [address]  [facility] - at [address] Get Driving Directions  [phone] [address] [place],  [place] [address]   [facility] Get Driving Directions [phone] [address] [place], [place], [place] [address]   [facility] Get Driving Directions  [phone] [address], [place] [address]   [facility] [place] Get Driving Directions [phone] [address], [place], [place] [address]  Your MyChart E-visit questionnaire answers were reviewed by a board certified advanced clinical practitioner to complete your personal care plan based on your specific symptoms.  Thank you for using e-Visits.   I provided 5 minutes of non face-to-face time during this encounter for chart review and documentation.

## 2022-03-03 ENCOUNTER — Other Ambulatory Visit: Payer: Self-pay | Admitting: Medical

## 2022-03-03 ENCOUNTER — Encounter: Payer: Self-pay | Admitting: Family Medicine

## 2022-03-03 ENCOUNTER — Ambulatory Visit (INDEPENDENT_AMBULATORY_CARE_PROVIDER_SITE_OTHER): Payer: No Typology Code available for payment source | Admitting: Family Medicine

## 2022-03-03 VITALS — BP 124/84 | HR 89 | Temp 98.4°F | Wt 220.6 lb

## 2022-03-03 DIAGNOSIS — N3000 Acute cystitis without hematuria: Secondary | ICD-10-CM | POA: Diagnosis not present

## 2022-03-03 DIAGNOSIS — H9202 Otalgia, left ear: Secondary | ICD-10-CM | POA: Diagnosis not present

## 2022-03-03 DIAGNOSIS — R399 Unspecified symptoms and signs involving the genitourinary system: Secondary | ICD-10-CM | POA: Diagnosis not present

## 2022-03-03 LAB — POCT URINALYSIS DIP (PROADVANTAGE DEVICE)
Blood, UA: NEGATIVE
Glucose, UA: NEGATIVE mg/dL
Ketones, POC UA: NEGATIVE mg/dL
Leukocytes, UA: NEGATIVE
Nitrite, UA: POSITIVE — AB
Protein Ur, POC: NEGATIVE mg/dL
Specific Gravity, Urine: 1.015
Urobilinogen, Ur: 0.2
pH, UA: 7.5 (ref 5.0–8.0)

## 2022-03-03 MED ORDER — CIPROFLOXACIN HCL 500 MG PO TABS: 500.0000 mg | ORAL_TABLET | Freq: Two times a day (BID) | ORAL | 0 refills | Status: DC

## 2022-03-03 NOTE — Telephone Encounter (Signed)
Jessica [name] is requesting to fill pt wegovy. Please advise [facility]

## 2022-03-03 NOTE — Progress Notes (Signed)
   Subjective:    Patient ID: Jessica [name], female    DOB: 1971/01/31, 51 y.o.   MRN: [id number]  HPI She has a 2-week history of difficulty with urgency, frequency and dysuria.  Is gotten worse the last couple days.  She also complains of left earache but no fever, chills, cough or congestion.   Review of Systems     Objective:   Physical Exam Left TM and canal is normal.  Right TM difficult to see but but appears normal.  Throat clear.  Neck is supple without adenopathy.  Urine dipstick was positive for nitrites.  6       Assessment & Plan:   UTI symptoms - Plan: POCT Urinalysis DIP (Proadvantage Device)  Acute cystitis without hematuria  Left ear pain Cipro 500 She has an allergy to sulfa and penicillin.  She has been on Cipro in the past and I gave it to her again.  Discussed possible C. difficile with her. Tylenol for the earache.

## 2022-03-04 ENCOUNTER — Other Ambulatory Visit (HOSPITAL_BASED_OUTPATIENT_CLINIC_OR_DEPARTMENT_OTHER): Payer: Self-pay

## 2022-03-04 ENCOUNTER — Other Ambulatory Visit (HOSPITAL_COMMUNITY): Payer: Self-pay

## 2022-03-04 MED ORDER — WEGOVY 0.25 MG/0.5ML ~~LOC~~ SOAJ
0.5000 mg | SUBCUTANEOUS | 0 refills | Status: DC
  Filled 2022-03-04 (×2): qty 2, 28d supply, fill #0

## 2022-03-06 ENCOUNTER — Other Ambulatory Visit (HOSPITAL_BASED_OUTPATIENT_CLINIC_OR_DEPARTMENT_OTHER): Payer: Self-pay

## 2022-03-23 ENCOUNTER — Other Ambulatory Visit: Payer: Self-pay | Admitting: Family Medicine

## 2022-03-23 ENCOUNTER — Other Ambulatory Visit (HOSPITAL_BASED_OUTPATIENT_CLINIC_OR_DEPARTMENT_OTHER): Payer: Self-pay

## 2022-03-23 NOTE — Telephone Encounter (Signed)
[facility] is requesting to fill pt wegovy . Please advise [facility]

## 2022-03-24 ENCOUNTER — Other Ambulatory Visit (HOSPITAL_BASED_OUTPATIENT_CLINIC_OR_DEPARTMENT_OTHER): Payer: Self-pay

## 2022-03-24 MED ORDER — WEGOVY 0.5 MG/0.5ML ~~LOC~~ SOAJ
0.5000 mg | SUBCUTANEOUS | 0 refills | Status: DC
Start: 2022-03-24 — End: 2022-06-26
  Filled 2022-03-24 – 2022-04-28 (×3): qty 2, 28d supply, fill #0

## 2022-03-27 ENCOUNTER — Ambulatory Visit (HOSPITAL_BASED_OUTPATIENT_CLINIC_OR_DEPARTMENT_OTHER): Payer: No Typology Code available for payment source | Attending: Medical | Admitting: Internal Medicine

## 2022-03-27 DIAGNOSIS — R400 Somnolence: Secondary | ICD-10-CM | POA: Insufficient documentation

## 2022-03-27 DIAGNOSIS — G4733 Obstructive sleep apnea (adult) (pediatric): Secondary | ICD-10-CM | POA: Diagnosis not present

## 2022-03-27 DIAGNOSIS — R5383 Other fatigue: Secondary | ICD-10-CM | POA: Insufficient documentation

## 2022-03-31 ENCOUNTER — Other Ambulatory Visit (HOSPITAL_BASED_OUTPATIENT_CLINIC_OR_DEPARTMENT_OTHER): Payer: Self-pay

## 2022-04-01 ENCOUNTER — Encounter: Payer: Self-pay | Admitting: Internal Medicine

## 2022-04-01 ENCOUNTER — Other Ambulatory Visit (HOSPITAL_BASED_OUTPATIENT_CLINIC_OR_DEPARTMENT_OTHER): Payer: Self-pay

## 2022-04-02 ENCOUNTER — Other Ambulatory Visit (HOSPITAL_BASED_OUTPATIENT_CLINIC_OR_DEPARTMENT_OTHER): Payer: Self-pay

## 2022-04-04 DIAGNOSIS — R400 Somnolence: Secondary | ICD-10-CM | POA: Diagnosis not present

## 2022-04-04 DIAGNOSIS — R5383 Other fatigue: Secondary | ICD-10-CM

## 2022-04-04 NOTE — Procedures (Signed)
    Patient Name: [name], Jessica Study Date: 03/27/2022 Gender: Female D.O.B: 1971/05/01 Age (years): 50 Referring Provider: Chana Bode Height (inches): 56 Interpreting Physician: Baird Lyons MD, ABSM Weight (lbs): 220 RPSGT: [name] BMI: 42 MRN: [id number] Neck Size: 15.00  CLINICAL INFORMATION Sleep Study Type: HST Indication for sleep study: Insomnia with OSA Epworth Sleepiness Score: 6  SLEEP STUDY TECHNIQUE A multi-channel overnight portable sleep study was performed. The channels recorded were: nasal airflow, thoracic respiratory movement, and oxygen saturation with a pulse oximetry. Snoring was also monitored.  MEDICATIONS Patient self administered medications include: none reported.  SLEEP ARCHITECTURE Patient was studied for 363.5 minutes. The sleep efficiency was 99.9 % and the patient was supine for 0%. The arousal index was 0.0 per hour.  RESPIRATORY PARAMETERS The overall AHI was 35.3 per hour, with a central apnea index of 0 per hour. The oxygen nadir was 75% during sleep.  CARDIAC DATA Mean heart rate during sleep was 90.9 bpm.  IMPRESSIONS - Severe obstructive sleep apnea occurred during this study (AHI = 35.3/h). - Oxygen desaturation was noted during this study (Min O2 = 75%).Mean 95% - Patient snored.  DIAGNOSIS - Obstructive Sleep Apnea (G47.33)  RECOMMENDATIONS - Suggest CPAP titration sleep study or autopap. Other options would be based on clinical judgment. - Be careful with alcohol, sedatives and other CNS depressants that Ball[place] worsen sleep apnea and disrupt normal sleep architecture. - Sleep hygiene should be reviewed to assess factors that Leeper improve sleep quality. - Weight management and regular exercise should be initiated or continued.  [Electronically signed] 04/04/2022 12:58 PM  Baird Lyons MD, [place], American Board of Sleep Medicine NPI: [id number]                         [place], [place] of Sleep [facility]  ELECTRONICALLY SIGNED ON:  04/04/2022, 12:55 PM [place] PH: [phone]   FX: [phone] [place]

## 2022-04-15 ENCOUNTER — Other Ambulatory Visit (HOSPITAL_BASED_OUTPATIENT_CLINIC_OR_DEPARTMENT_OTHER): Payer: Self-pay

## 2022-04-17 ENCOUNTER — Other Ambulatory Visit: Payer: Self-pay | Admitting: Internal Medicine

## 2022-04-17 DIAGNOSIS — G4733 Obstructive sleep apnea (adult) (pediatric): Secondary | ICD-10-CM

## 2022-04-17 NOTE — Progress Notes (Signed)
I have put in order for cpap machine to [facility] and pt is scheduled for 04/24/22 to discuss this

## 2022-04-20 ENCOUNTER — Other Ambulatory Visit (HOSPITAL_BASED_OUTPATIENT_CLINIC_OR_DEPARTMENT_OTHER): Payer: Self-pay

## 2022-04-20 ENCOUNTER — Other Ambulatory Visit (HOSPITAL_BASED_OUTPATIENT_CLINIC_OR_DEPARTMENT_OTHER): Payer: Self-pay | Admitting: Nurse Practitioner

## 2022-04-20 MED ORDER — CIPROFLOXACIN HCL 500 MG PO TABS
500.0000 mg | ORAL_TABLET | Freq: Two times a day (BID) | ORAL | 0 refills | Status: DC
  Filled 2022-04-20: qty 10, 5d supply, fill #0

## 2022-04-20 MED ORDER — FLUCONAZOLE 150 MG PO TABS
150.0000 mg | ORAL_TABLET | Freq: Once | ORAL | 0 refills | Status: AC
  Filled 2022-04-20: qty 2, 2d supply, fill #0

## 2022-04-21 ENCOUNTER — Other Ambulatory Visit (HOSPITAL_BASED_OUTPATIENT_CLINIC_OR_DEPARTMENT_OTHER): Payer: Self-pay

## 2022-04-24 ENCOUNTER — Ambulatory Visit (INDEPENDENT_AMBULATORY_CARE_PROVIDER_SITE_OTHER): Payer: No Typology Code available for payment source | Admitting: Medical

## 2022-04-24 ENCOUNTER — Other Ambulatory Visit (HOSPITAL_BASED_OUTPATIENT_CLINIC_OR_DEPARTMENT_OTHER): Payer: Self-pay

## 2022-04-24 VITALS — BP 120/80 | HR 64 | Wt 217.4 lb

## 2022-04-24 DIAGNOSIS — I10 Essential (primary) hypertension: Secondary | ICD-10-CM | POA: Diagnosis not present

## 2022-04-24 DIAGNOSIS — F419 Anxiety disorder, unspecified: Secondary | ICD-10-CM

## 2022-04-24 DIAGNOSIS — G4733 Obstructive sleep apnea (adult) (pediatric): Secondary | ICD-10-CM | POA: Diagnosis not present

## 2022-04-24 DIAGNOSIS — R7301 Impaired fasting glucose: Secondary | ICD-10-CM

## 2022-04-24 DIAGNOSIS — R7303 Prediabetes: Secondary | ICD-10-CM

## 2022-04-24 DIAGNOSIS — R400 Somnolence: Secondary | ICD-10-CM

## 2022-04-24 DIAGNOSIS — Z6841 Body Mass Index (BMI) 40.0 and over, adult: Secondary | ICD-10-CM

## 2022-04-24 MED ORDER — WEGOVY 1 MG/0.5ML ~~LOC~~ SOAJ
1.0000 mg | SUBCUTANEOUS | 1 refills | Status: DC
Start: 2022-04-24 — End: 2022-06-17
  Filled 2022-04-24 – 2022-05-26 (×2): qty 2, 28d supply, fill #0
  Filled 2022-06-17: qty 2, 28d supply, fill #1

## 2022-04-24 NOTE — Progress Notes (Signed)
Subjective:  Jessica [name] is a 51 y.o. female who presents for Chief Complaint  Patient presents with   discuss sleep study    Discuss sleep study-      Here to discuss recent sleep study results.  She is doing fine on Wegovy, just recently started the 0.5 mg Is exercising, trying to eat healthy.  She is compliant with her other medicines as usual.  No other aggravating or relieving factors.    No other c/o.  Past Medical History:  Diagnosis Date   Allergy    Anemia    AS A CHILD   Anxiety    Cataract    BORN WITH IT   Cystitis, chronic    GERD (gastroesophageal reflux disease)    Gross hematuria    years ago, saw urology   Heart murmur    AS A CHILD   High cholesterol    Hypertension    Obesity    Pre-diabetes    Current Outpatient Medications on File Prior to Visit  Medication Sig Dispense Refill   ALPRAZolam (XANAX) 0.5 MG tablet Take 1 tablet (0.5 mg total) by mouth daily as needed for anxiety. 15 tablet 5   atorvastatin (LIPITOR) 10 MG tablet Take 1 tablet (10 mg total) by mouth daily. 90 tablet 0   Cetirizine HCl (ZYRTEC ALLERGY PO) Take by mouth daily. TAKE ONE PILL DAILY     citalopram (CELEXA) 20 MG tablet Take 1 and 1/2 tablets (30 mg total) by mouth daily. 135 tablet 0   Docusate Calcium (STOOL SOFTENER PO) Take by mouth.     fluticasone (FLONASE) 50 MCG/ACT nasal spray Place 2 sprays into both nostrils daily. 16 g 6   lisinopril (ZESTRIL) 20 MG tablet Take 1/2 tablet by mouth every morning and 1 tablet by mouth at bedtime 135 tablet 3   metFORMIN (GLUCOPHAGE) 500 MG tablet Take 1 tablet (500 mg total) by mouth 2 (two) times daily. 180 tablet 3   omeprazole (PRILOSEC) 20 MG capsule Take 1 capsule (20 mg total) by mouth daily. (Patient taking differently: Take 20 mg by mouth as needed.) 90 capsule 1   No current facility-administered medications on file prior to visit.     The following portions of the patient's history were reviewed and updated as appropriate:  allergies, current medications, past family history, past medical history, past social history, past surgical history and problem list.  ROS Otherwise as in subjective above  Objective: BP 120/80   Pulse 64   Wt 217 lb 6.4 oz (98.6 kg)   BMI 41.08 kg/m   General appearance: alert, no distress, well developed, well nourished    Assessment: Encounter Diagnoses  Name Primary?   OSA (obstructive sleep apnea) Yes   Impaired fasting glucose    Prediabetes    Essential hypertension    Daytime somnolence    Anxiety    BMI 40.0-44.9, adult (HCC)      Plan: Sleep apnea-we discussed her recent sleep study results showing severe sleep apnea.  We discussed risk of uncontrolled sleep apnea.  We discussed ways to deal with sleep apnea including weight loss, elevate head of bed, CPAP, mouthpiece or weight device, other options.  She is willing to begin trial of CPAP.  We will follow-up in 4 to 6 weeks hopefully  Obesity-continue efforts to lose weight through healthy diet, exercise and we will go ahead and increase the Wegovy dose at this point.  Prediabetes-continue efforts with healthy diet and exercise and  weight loss  Continue rest of medicines as usual  Jessica [name] was seen today for discuss sleep study.  Diagnoses and all orders for this visit:  OSA (obstructive sleep apnea)  Impaired fasting glucose  Prediabetes  Essential hypertension  Daytime somnolence  Anxiety  BMI 40.0-44.9, adult ([place])  Other orders -     Semaglutide-Weight Management (WEGOVY) 1 MG/0.5ML SOAJ; Inject 1 mg into the skin once a week.    Follow up: 4-8 wk

## 2022-04-27 ENCOUNTER — Other Ambulatory Visit (HOSPITAL_BASED_OUTPATIENT_CLINIC_OR_DEPARTMENT_OTHER): Payer: Self-pay

## 2022-04-28 ENCOUNTER — Other Ambulatory Visit (HOSPITAL_BASED_OUTPATIENT_CLINIC_OR_DEPARTMENT_OTHER): Payer: Self-pay

## 2022-05-07 ENCOUNTER — Telehealth: Payer: Self-pay | Admitting: Internal Medicine

## 2022-05-07 ENCOUNTER — Other Ambulatory Visit (HOSPITAL_BASED_OUTPATIENT_CLINIC_OR_DEPARTMENT_OTHER): Payer: Self-pay

## 2022-05-07 DIAGNOSIS — R7303 Prediabetes: Secondary | ICD-10-CM

## 2022-05-07 DIAGNOSIS — E7800 Pure hypercholesterolemia, unspecified: Secondary | ICD-10-CM

## 2022-05-07 MED ORDER — METFORMIN HCL 500 MG PO TABS
500.0000 mg | ORAL_TABLET | Freq: Two times a day (BID) | ORAL | 0 refills | Status: DC
  Filled 2022-05-07 – 2022-05-26 (×2): qty 180, 90d supply, fill #0

## 2022-05-07 MED ORDER — ATORVASTATIN CALCIUM 10 MG PO TABS
10.0000 mg | ORAL_TABLET | Freq: Every day | ORAL | 0 refills | Status: DC
  Filled 2022-05-07: qty 90, 90d supply, fill #0

## 2022-05-07 NOTE — Telephone Encounter (Signed)
Pt has asked that meds be refilled

## 2022-05-14 ENCOUNTER — Ambulatory Visit (HOSPITAL_BASED_OUTPATIENT_CLINIC_OR_DEPARTMENT_OTHER): Payer: No Typology Code available for payment source

## 2022-05-14 ENCOUNTER — Telehealth (INDEPENDENT_AMBULATORY_CARE_PROVIDER_SITE_OTHER): Payer: No Typology Code available for payment source | Admitting: Medical

## 2022-05-14 ENCOUNTER — Other Ambulatory Visit (HOSPITAL_BASED_OUTPATIENT_CLINIC_OR_DEPARTMENT_OTHER): Payer: Self-pay

## 2022-05-14 ENCOUNTER — Other Ambulatory Visit (INDEPENDENT_AMBULATORY_CARE_PROVIDER_SITE_OTHER): Payer: No Typology Code available for payment source

## 2022-05-14 DIAGNOSIS — R319 Hematuria, unspecified: Secondary | ICD-10-CM

## 2022-05-14 DIAGNOSIS — N390 Urinary tract infection, site not specified: Secondary | ICD-10-CM

## 2022-05-14 MED ORDER — DEXAMETHASONE SODIUM PHOSPHATE 10 MG/ML IJ SOLN
10.0000 mg | Freq: Once | INTRAMUSCULAR | Status: AC
  Administered 2022-05-14: 10 mg via INTRAVENOUS

## 2022-05-14 MED ORDER — CEFTRIAXONE SODIUM 500 MG IJ SOLR
500.0000 mg | Freq: Once | INTRAMUSCULAR | Status: AC
  Administered 2022-05-14: 500 mg via INTRAMUSCULAR

## 2022-05-14 MED ORDER — NITROFURANTOIN MONOHYD MACRO 100 MG PO CAPS
100.0000 mg | ORAL_CAPSULE | Freq: Two times a day (BID) | ORAL | 0 refills | Status: DC
  Filled 2022-05-14: qty 14, 7d supply, fill #0

## 2022-05-14 NOTE — Progress Notes (Signed)
Subjective:     Patient ID: Jessica [name], female   DOB: 17-May-1971, 51 y.o.   MRN: [id number]  This visit type was conducted due to national recommendations for restrictions regarding the COVID-19 Pandemic (e.g. social distancing) in an effort to limit this patient's exposure and mitigate transmission in our community.  Due to their co-morbid illnesses, this patient is at least at moderate risk for complications without adequate follow up.  This format is felt to be most appropriate for this patient at this time.    Documentation for virtual audio and video telecommunications through [facility] encounter:  The patient was located at home. The provider was located in the office. The patient did consent to this visit and is aware of possible charges through their insurance for this visit.  The other persons participating in this telemedicine service were none. Time spent on call was 20 minutes and in review of previous records 20 minutes total.  This virtual service is not related to other E/M service within previous 7 days.   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 SaraBeth NP at her work recommends Rocephin if you feel is needed and can send for urine culture Symptoms- back pain ---urgency and frequency and feel BLAH.    Virtual for possible UTI.  She has discomfort, burning with urination, frequency, urgency.  She has had UTIs in 2 other times in the last few months.  Has seen urology in the past, but was several years ago.  No fever, she does see blood on the toilet paper though.  No nausea or vomiting.  No body aches or chills.  No vaginal discharge.  No new soaps.  Married, no concern for STD. No other aggravating or relieving factors. No other complaint.   Past Medical History:  Diagnosis Date   Allergy    Anemia    AS A CHILD   Anxiety    Cataract    BORN WITH IT    Cystitis, chronic    GERD (gastroesophageal reflux disease)    Gross hematuria    years ago, saw urology   Heart murmur    AS A CHILD   High cholesterol    Hypertension    Obesity    Pre-diabetes    Current Outpatient Medications on File Prior to Visit  Medication Sig Dispense Refill   ALPRAZolam (XANAX) 0.5 MG tablet Take 1 tablet (0.5 mg total) by mouth daily as needed for anxiety. 15 tablet 5   atorvastatin (LIPITOR) 10 MG tablet Take 1 tablet (10 mg total) by mouth daily. 90 tablet 0   Cetirizine HCl (ZYRTEC ALLERGY PO) Take by mouth daily. TAKE ONE PILL DAILY     citalopram (CELEXA) 20 MG tablet Take 1 and 1/2 tablets (30 mg total) by mouth daily. 135 tablet 0   Docusate Calcium (STOOL SOFTENER PO) Take by mouth.     fluticasone (FLONASE) 50 MCG/ACT nasal spray Place 2 sprays into both nostrils daily. 16 g 6   lisinopril (ZESTRIL) 20 MG tablet Take 1/2 tablet by mouth every morning and 1 tablet by mouth at bedtime 135 tablet 3   metFORMIN (GLUCOPHAGE) 500 MG tablet Take 1 tablet (500 mg total) by mouth 2 (two) times daily. 180 tablet 0   omeprazole (PRILOSEC) 20 MG capsule Take 1 capsule (20 mg total) by mouth daily. (Patient taking differently: Take 20  mg by mouth as needed.) 90 capsule 1   Semaglutide-Weight Management (WEGOVY) 0.5 MG/0.5ML SOAJ Inject 0.5 mg into the skin once a week. 2 mL 0   Semaglutide-Weight Management (WEGOVY) 1 MG/0.5ML SOAJ Inject 1 mg into the skin once a week. 2 mL 1   No current facility-administered medications on file prior to visit.    Review of Systems As in subjective    Objective:   Physical Exam Due to coronavirus pandemic stay at home measures, patient visit was virtual and they were not examined in person.   There were no vitals taken for this visit.  Gen: wd, wn, nad      Assessment:     Encounter Diagnosis  Name Primary?   Urinary tract infection with hematuria, site unspecified Yes       Plan:     We discussed concerns  and symptoms.  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  The urine is abnormal for urinalysis.  She will make sure urine gets sent out for culture.  Begin antibiotic below.  Referral to urology given recent back-to-back urinary tract infections in the last 2 months  [name] was seen today for possible uti.  Diagnoses and all orders for this visit:  Urinary tract infection with hematuria, site unspecified -     Urine Culture -     Ambulatory referral to Urology  Other orders -     nitrofurantoin, macrocrystal-monohydrate, (MACROBID) 100 MG capsule; Take 1 capsule (100 mg total) by mouth 2 (two) times daily.    F/u prn

## 2022-05-14 NOTE — Progress Notes (Signed)
Patient was given rocephin and dexamethasone per [name] for UTI. Patient tolerated injection well.

## 2022-05-15 NOTE — Addendum Note (Signed)
Addended by: Joua Bake, Clarise Cruz E on: 05/15/2022 06:02 PM   Modules accepted: Level of Service

## 2022-05-17 LAB — URINE CULTURE

## 2022-05-18 ENCOUNTER — Other Ambulatory Visit: Payer: Self-pay | Admitting: Medical

## 2022-05-18 ENCOUNTER — Other Ambulatory Visit (HOSPITAL_BASED_OUTPATIENT_CLINIC_OR_DEPARTMENT_OTHER): Payer: Self-pay

## 2022-05-18 MED ORDER — CIPROFLOXACIN HCL 500 MG PO TABS
500.0000 mg | ORAL_TABLET | Freq: Two times a day (BID) | ORAL | 0 refills | Status: AC
  Filled 2022-05-18: qty 14, 7d supply, fill #0

## 2022-05-26 ENCOUNTER — Other Ambulatory Visit (HOSPITAL_BASED_OUTPATIENT_CLINIC_OR_DEPARTMENT_OTHER): Payer: Self-pay

## 2022-05-26 DIAGNOSIS — K219 Gastro-esophageal reflux disease without esophagitis: Secondary | ICD-10-CM

## 2022-05-27 ENCOUNTER — Other Ambulatory Visit (HOSPITAL_BASED_OUTPATIENT_CLINIC_OR_DEPARTMENT_OTHER): Payer: Self-pay

## 2022-06-01 ENCOUNTER — Other Ambulatory Visit (HOSPITAL_BASED_OUTPATIENT_CLINIC_OR_DEPARTMENT_OTHER): Payer: Self-pay

## 2022-06-01 MED ORDER — OMEPRAZOLE 20 MG PO CPDR
20.0000 mg | DELAYED_RELEASE_CAPSULE | Freq: Every day | ORAL | 1 refills | Status: AC
  Filled 2022-06-01: qty 90, 90d supply, fill #0

## 2022-06-12 ENCOUNTER — Ambulatory Visit: Payer: No Typology Code available for payment source | Admitting: Medical

## 2022-06-17 ENCOUNTER — Other Ambulatory Visit (HOSPITAL_BASED_OUTPATIENT_CLINIC_OR_DEPARTMENT_OTHER): Payer: Self-pay

## 2022-06-17 MED ORDER — WEGOVY 1 MG/0.5ML ~~LOC~~ SOAJ
1.0000 mg | SUBCUTANEOUS | 1 refills | Status: DC
  Filled 2022-06-17: qty 2, 28d supply, fill #0

## 2022-06-26 ENCOUNTER — Ambulatory Visit (INDEPENDENT_AMBULATORY_CARE_PROVIDER_SITE_OTHER): Payer: No Typology Code available for payment source | Admitting: Medical

## 2022-06-26 ENCOUNTER — Other Ambulatory Visit (HOSPITAL_BASED_OUTPATIENT_CLINIC_OR_DEPARTMENT_OTHER): Payer: Self-pay

## 2022-06-26 VITALS — BP 110/70 | HR 74 | Wt 213.4 lb

## 2022-06-26 DIAGNOSIS — I10 Essential (primary) hypertension: Secondary | ICD-10-CM

## 2022-06-26 DIAGNOSIS — Z6841 Body Mass Index (BMI) 40.0 and over, adult: Secondary | ICD-10-CM | POA: Diagnosis not present

## 2022-06-26 DIAGNOSIS — E7800 Pure hypercholesterolemia, unspecified: Secondary | ICD-10-CM

## 2022-06-26 DIAGNOSIS — H6121 Impacted cerumen, right ear: Secondary | ICD-10-CM

## 2022-06-26 DIAGNOSIS — R7301 Impaired fasting glucose: Secondary | ICD-10-CM

## 2022-06-26 DIAGNOSIS — H938X3 Other specified disorders of ear, bilateral: Secondary | ICD-10-CM

## 2022-06-26 LAB — POCT GLYCOSYLATED HEMOGLOBIN (HGB A1C): Hemoglobin A1C: 6.3 % — AB (ref 4.0–5.6)

## 2022-06-26 IMAGING — MG MM DIGITAL SCREENING BILAT W/ TOMO AND CAD
8 series · 8 of 24 positions shown · non-contrast
Comparison: Previous exam(s).

CLINICAL DATA: Screening.

EXAM:
DIGITAL SCREENING BILATERAL MAMMOGRAM WITH TOMOSYNTHESIS AND CAD
TECHNIQUE: Bilateral screening digital craniocaudal and mediolateral oblique
mammograms were obtained. Bilateral screening digital breast
tomosynthesis was performed. The images were evaluated with
computer-aided detection.

[L CC synth-2D]
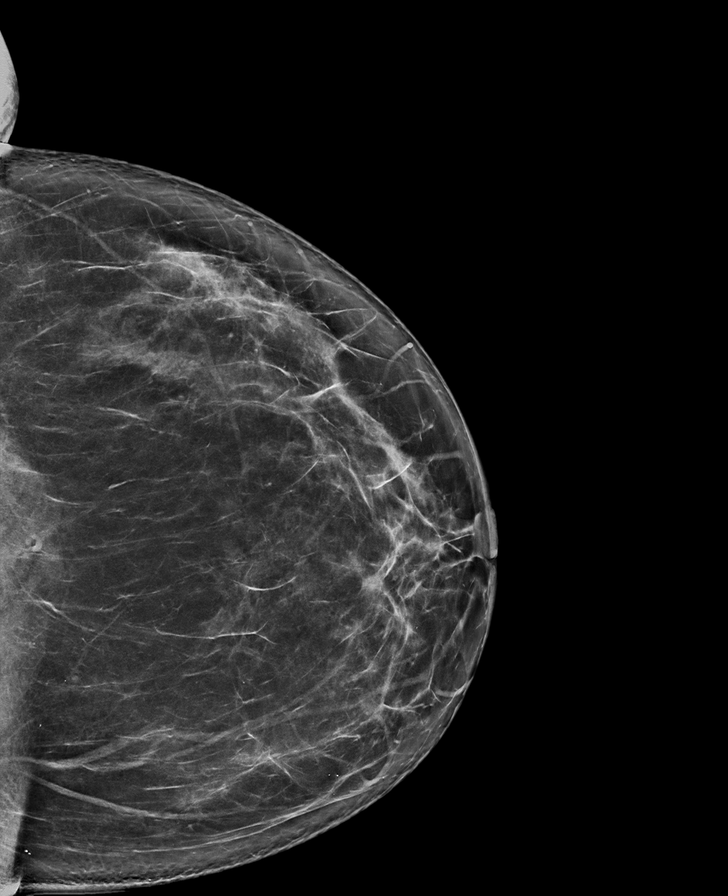

[R CC synth-2D]
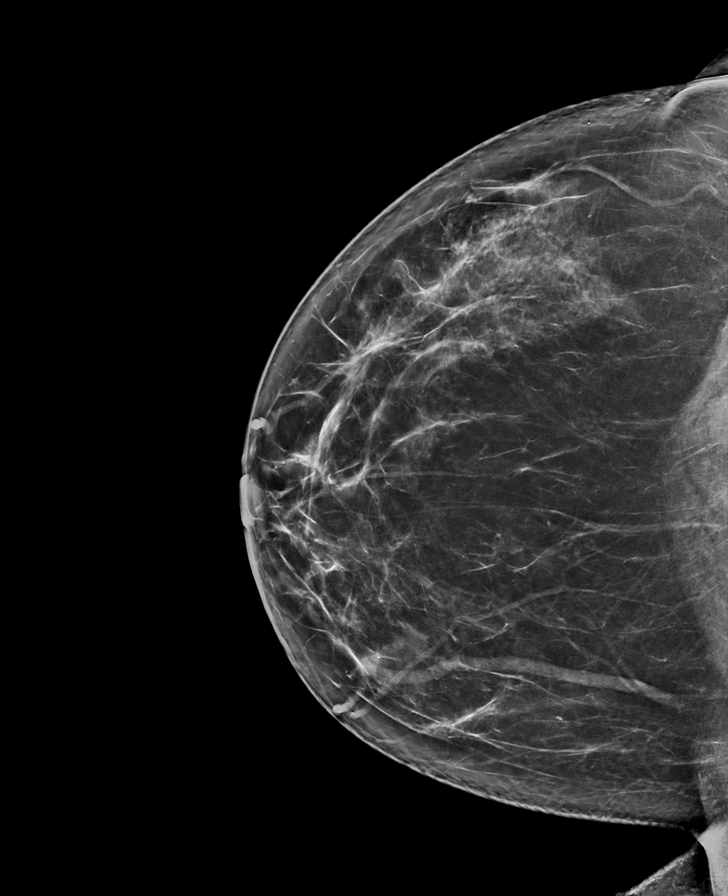

[R MLO synth-2D]
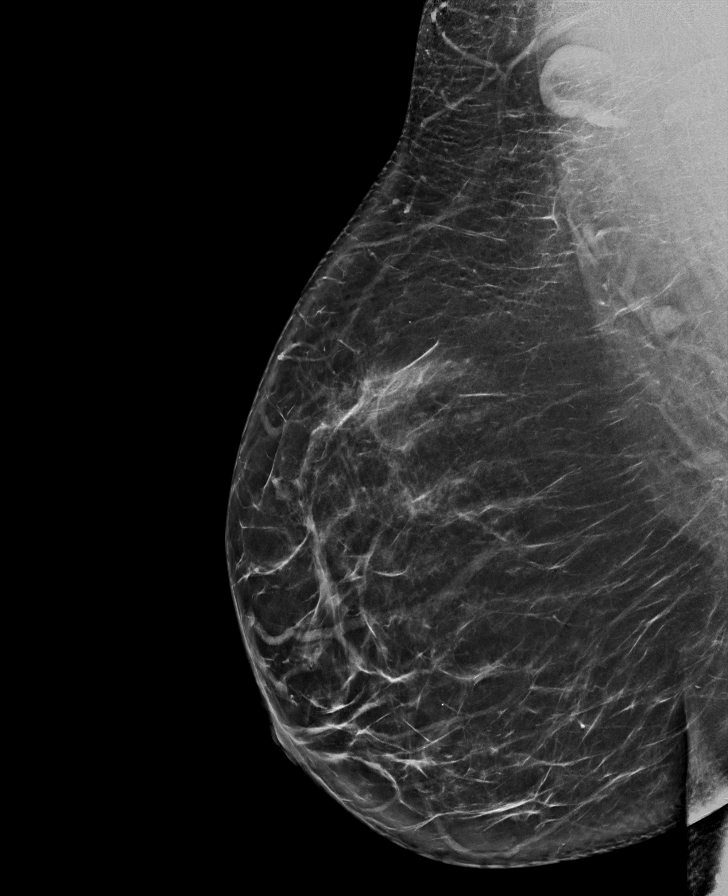

[L MLO synth-2D]
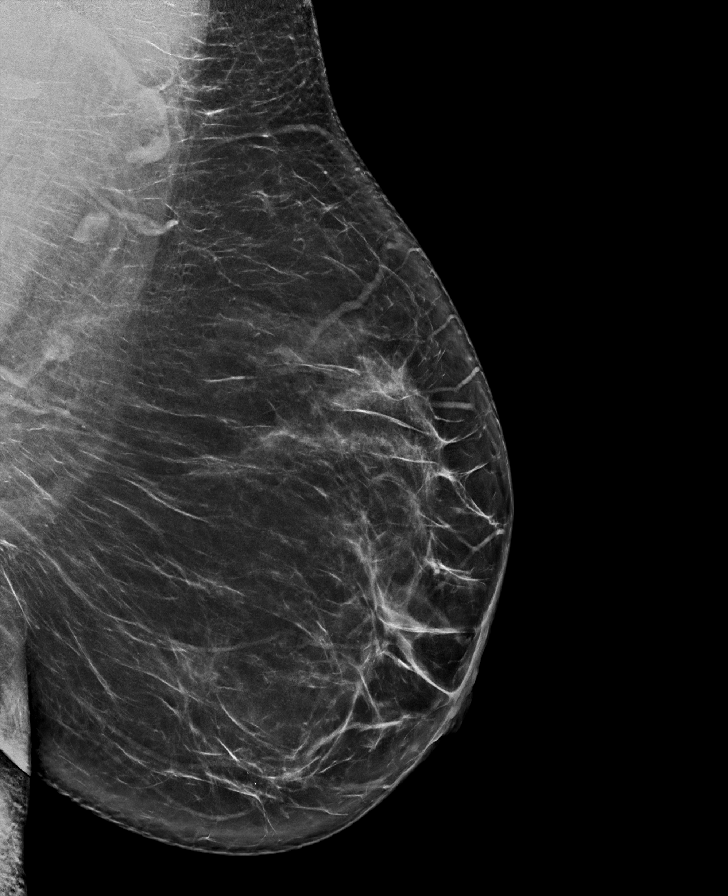

[R CC tomo · tomo slice 43/85.0]
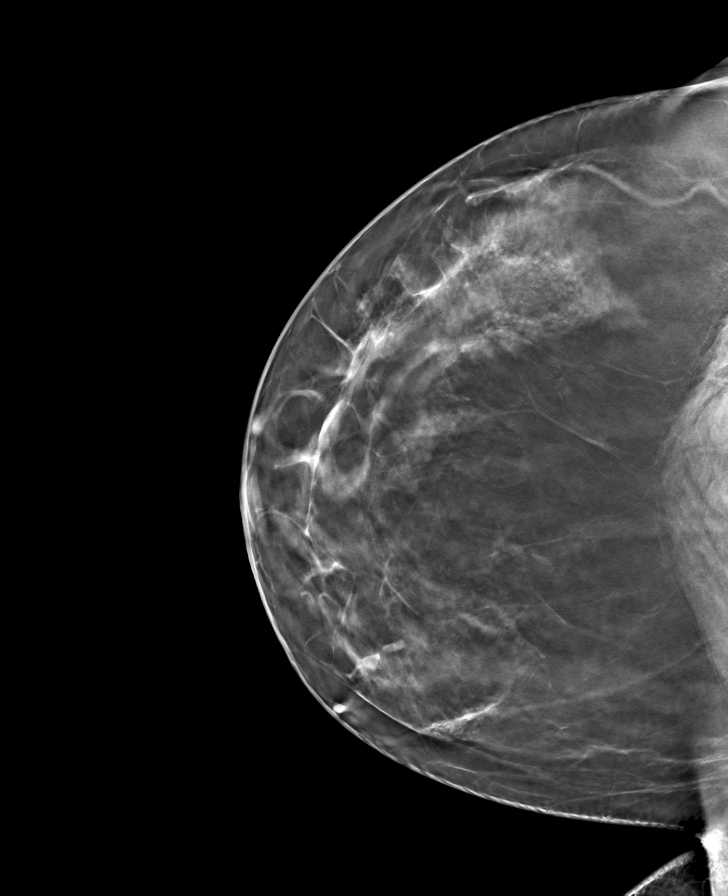

[R MLO tomo · tomo slice 47/93.0]
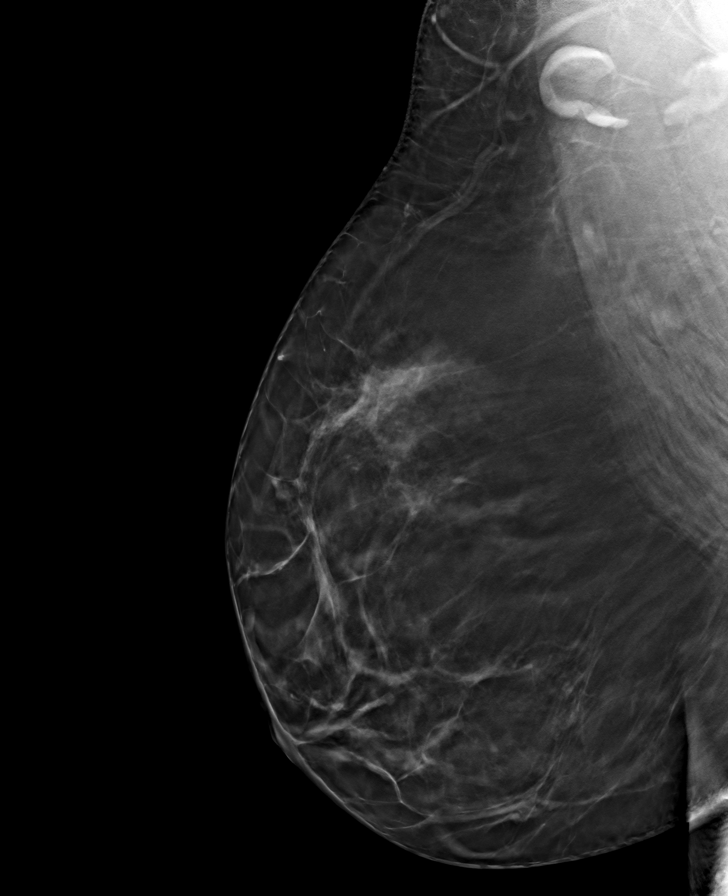

[L MLO tomo · tomo slice 47/92.0]
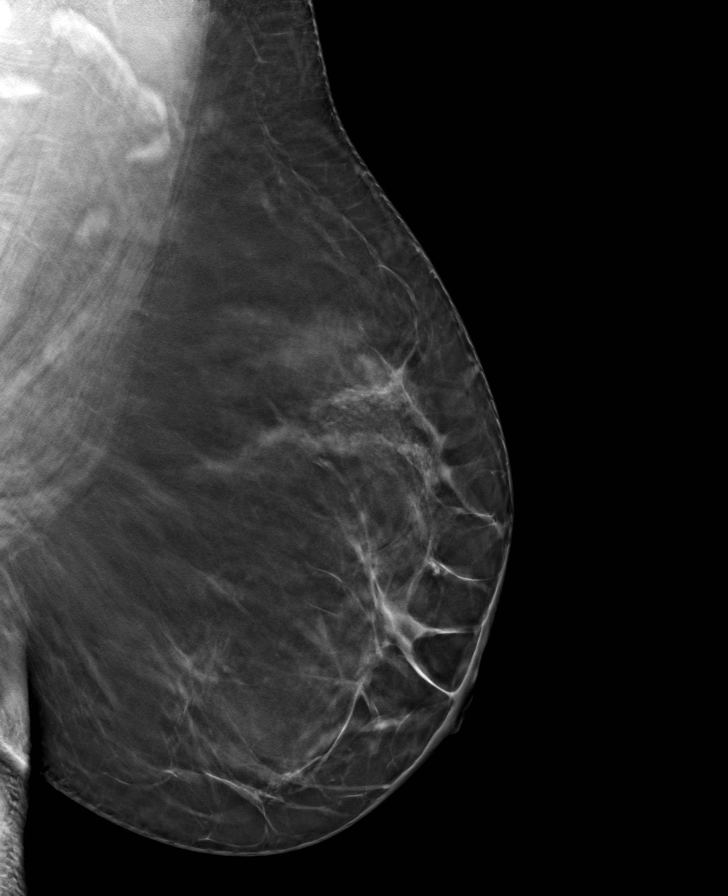

[L CC tomo · tomo slice 43/84.0]
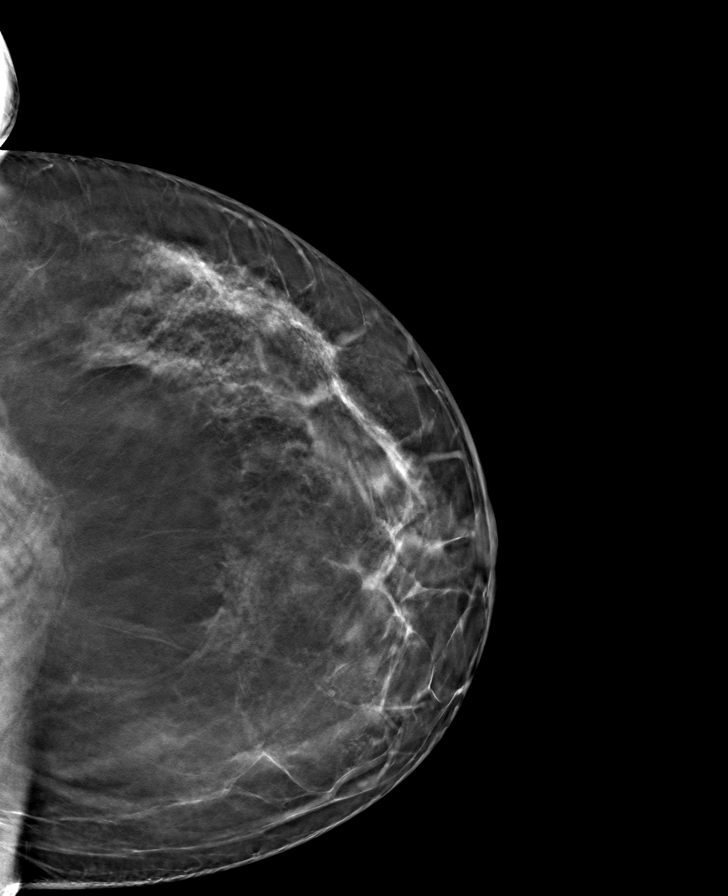

[8 of 24 positions shown; findings below may reference images not displayed]

ACR Breast Density Category b: There are scattered areas of
fibroglandular density.
FINDINGS: There are no findings suspicious for malignancy. The images were
evaluated with computer-aided detection.
IMPRESSION: No mammographic evidence of malignancy. A result letter of this
screening mammogram will be mailed directly to the patient.

RECOMMENDATION:
Screening mammogram in one year. (Code:WJ-I-BG6)

BI-RADS CATEGORY  1: Negative.

## 2022-06-26 MED ORDER — CEFUROXIME AXETIL 500 MG PO TABS
500.0000 mg | ORAL_TABLET | Freq: Two times a day (BID) | ORAL | 0 refills | Status: AC
  Filled 2022-06-26: qty 20, 10d supply, fill #0

## 2022-06-26 MED ORDER — WEGOVY 1.7 MG/0.75ML ~~LOC~~ SOAJ
1.7000 mg | SUBCUTANEOUS | 1 refills | Status: DC
  Filled 2022-06-26: qty 3, 28d supply, fill #0
  Filled 2022-08-02: qty 3, 28d supply, fill #1

## 2022-06-26 NOTE — Progress Notes (Signed)
Subjective:  Jessica [name] is a 50 y.o. female who presents for Chief Complaint  Patient presents with   Follow-up    Follow-up- on weight loss.       Here for f/u on weight loss efforts.     Exercise - parking further from entrances, walking her driveway.     Eating habits currently are improved.  Avoiding soda, has cut back on sweet tea.  Using more fruits and vegetables.   Avoiding red meat.    Compliant with VPXTGG.  No reported side effects.  Having some ear pressure as well. No URI symptoms, no fever, no cough or congestion  No other aggravating or relieving factors.    No other c/o.  Past Medical History:  Diagnosis Date   Allergy    Anemia    AS A CHILD   Anxiety    Cataract    BORN WITH IT   Cystitis, chronic    GERD (gastroesophageal reflux disease)    Gross hematuria    years ago, saw urology   Heart murmur    AS A CHILD   High cholesterol    Hypertension    Obesity    Pre-diabetes    Current Outpatient Medications on File Prior to Visit  Medication Sig Dispense Refill   ALPRAZolam (XANAX) 0.5 MG tablet Take 1 tablet (0.5 mg total) by mouth daily as needed for anxiety. 15 tablet 5   atorvastatin (LIPITOR) 10 MG tablet Take 1 tablet (10 mg total) by mouth daily. 90 tablet 0   Cetirizine HCl (ZYRTEC ALLERGY PO) Take by mouth daily. TAKE ONE PILL DAILY     citalopram (CELEXA) 20 MG tablet Take 1 and 1/2 tablets (30 mg total) by mouth daily. 135 tablet 0   Docusate Calcium (STOOL SOFTENER PO) Take by mouth.     fluticasone (FLONASE) 50 MCG/ACT nasal spray Place 2 sprays into both nostrils daily. 16 g 6   lisinopril (ZESTRIL) 20 MG tablet Take 1/2 tablet by mouth every morning and 1 tablet by mouth at bedtime 135 tablet 3   metFORMIN (GLUCOPHAGE) 500 MG tablet Take 1 tablet (500 mg total) by mouth 2 (two) times daily. 180 tablet 0   omeprazole (PRILOSEC) 20 MG capsule Take 1 capsule (20 mg total) by mouth daily. 90 capsule 1   No current facility-administered  medications on file prior to visit.     The following portions of the patient's history were reviewed and updated as appropriate: allergies, current medications, past family history, past medical history, past social history, past surgical history and problem list.  ROS Otherwise as in subjective above    Objective: BP 110/70   Pulse 74   Wt 213 lb 6.4 oz (96.8 kg)   BMI 40.32 kg/m   Wt Readings from Last 3 Encounters:  06/26/22 213 lb 6.4 oz (96.8 kg)  04/24/22 217 lb 6.4 oz (98.6 kg)  03/03/22 220 lb 9.6 oz (100.1 kg)   BP Readings from Last 3 Encounters:  06/26/22 110/70  04/24/22 120/80  03/03/22 124/84    General appearance: alert, no distress, well developed, well nourished HEENT: normocephalic, sclerae anicteric, conjunctiva pink and moist, moderate cerumen right, TM normal, left TM flat, mild to moderate wax, nares patent, no discharge or erythema, pharynx normal Oral cavity: MMM, no lesions Neck: supple, no lymphadenopathy, no thyromegaly, no masses Heart: RRR, normal S1, S2, no murmurs Lungs: CTA bilaterally, no wheezes, rhonchi, or rales Pulses: 2+ radial pulses, 2+ pedal pulses, normal  cap refill Ext: no edema   Assessment: Encounter Diagnoses  Name Primary?   Impaired fasting glucose Yes   BMI 40.0-44.9, adult (HCC)    Essential hypertension    Pure hypercholesterolemia    Ear pressure, bilateral    Impacted cerumen of right ear      Plan: Impaired glucose-updated hemoglobin A1c today, continue metformin and Wegovy  BMI greater than 40-counseled on medication diet and exercise.  We discussed increasing exercise frequency and type of exercise.  Discussed medication.  Increase Wegovy to 1 mg for 1 month then go up to 1.7 mg.  Wegovy 1 mg dose was called out recently but she has not picked it up yet.  Hyperlipidemia-continue current medication Lipitor 10 mg daily  Hypertension-continue lisinopril 20 mg daily  She consented to ear lavage.   Successfully removed cerumen from right ear  Ear pressure-begin over-the-counter decongestant and Mucinex for the next few days.  If not improving over the weekend, then begin Ceftin as below  [name] was seen today for follow-up.  Diagnoses and all orders for this visit:  Impaired fasting glucose -     HgB A1c  BMI 40.0-44.9, adult (HCC) -     HgB A1c  Essential hypertension  Pure hypercholesterolemia  Ear pressure, bilateral  Impacted cerumen of right ear  Other orders -     Semaglutide-Weight Management (WEGOVY) 1.7 MG/0.75ML SOAJ; Inject 1.7 mg into the skin once a week. -     cefUROXime (CEFTIN) 500 MG tablet; Take 1 tablet (500 mg total) by mouth 2 (two) times daily with a meal for 10 days.    Follow up: 14mo

## 2022-06-28 ENCOUNTER — Other Ambulatory Visit (HOSPITAL_BASED_OUTPATIENT_CLINIC_OR_DEPARTMENT_OTHER): Payer: Self-pay

## 2022-06-29 ENCOUNTER — Other Ambulatory Visit (HOSPITAL_BASED_OUTPATIENT_CLINIC_OR_DEPARTMENT_OTHER): Payer: Self-pay

## 2022-07-01 ENCOUNTER — Encounter (INDEPENDENT_AMBULATORY_CARE_PROVIDER_SITE_OTHER): Payer: No Typology Code available for payment source | Admitting: Family Medicine

## 2022-07-01 DIAGNOSIS — Z0289 Encounter for other administrative examinations: Secondary | ICD-10-CM

## 2022-07-07 ENCOUNTER — Other Ambulatory Visit (HOSPITAL_BASED_OUTPATIENT_CLINIC_OR_DEPARTMENT_OTHER): Payer: Self-pay

## 2022-07-07 ENCOUNTER — Other Ambulatory Visit: Payer: Self-pay | Admitting: Medical

## 2022-07-07 MED ORDER — ONDANSETRON HCL 4 MG PO TABS
4.0000 mg | ORAL_TABLET | Freq: Three times a day (TID) | ORAL | 0 refills | Status: DC | PRN
  Filled 2022-07-07: qty 20, 7d supply, fill #0

## 2022-07-10 ENCOUNTER — Ambulatory Visit: Payer: Self-pay

## 2022-07-10 ENCOUNTER — Ambulatory Visit (INDEPENDENT_AMBULATORY_CARE_PROVIDER_SITE_OTHER): Payer: No Typology Code available for payment source | Admitting: Surgical

## 2022-07-10 ENCOUNTER — Encounter: Payer: Self-pay | Admitting: Surgical

## 2022-07-10 ENCOUNTER — Ambulatory Visit (INDEPENDENT_AMBULATORY_CARE_PROVIDER_SITE_OTHER): Payer: No Typology Code available for payment source

## 2022-07-10 DIAGNOSIS — G8929 Other chronic pain: Secondary | ICD-10-CM

## 2022-07-10 DIAGNOSIS — M25512 Pain in left shoulder: Secondary | ICD-10-CM

## 2022-07-10 DIAGNOSIS — M19012 Primary osteoarthritis, left shoulder: Secondary | ICD-10-CM

## 2022-07-10 MED ORDER — LIDOCAINE HCL 1 % IJ SOLN
3.0000 mL | INTRAMUSCULAR | Status: AC | PRN
  Administered 2022-07-10: 3 mL

## 2022-07-10 MED ORDER — BUPIVACAINE HCL 0.25 % IJ SOLN
0.6600 mL | INTRAMUSCULAR | Status: AC | PRN
  Administered 2022-07-10: .66 mL via INTRA_ARTICULAR

## 2022-07-10 MED ORDER — METHYLPREDNISOLONE ACETATE 40 MG/ML IJ SUSP
13.3300 mg | INTRAMUSCULAR | Status: AC | PRN
  Administered 2022-07-10: 13.33 mg via INTRA_ARTICULAR

## 2022-07-10 NOTE — Progress Notes (Signed)
Office Visit Note   Patient: Jessica [name]           Date of Birth: Sep 28, 1970           MRN: [id number] Visit Date: 07/10/2022 Requested by: Carlena Hurl, PA-C [address] [place],  [place] [address] PCP: Carlena Hurl, PA-C  Subjective: Chief Complaint  Patient presents with   Left Shoulder - Pain    HPI: Jessica [name] is a 51 y.o. female who presents to the office reporting left shoulder pain.  Patient states that pain began a few weeks ago without injury.  She localizes pain to the superior and lateral aspects of the shoulder.  No radiation of pain past the lateral aspect of the shoulder.  She has no numbness or tingling.  No history of prior shoulder pain/surgery/dislocation.  She cannot lay on her left side.  Pain is worse with reaching up in an abducted position and reaching across her body.  No instability, snapping sensation, gearshift sensation, stiffness, numbness/tingling.  No weakness.  No scapular pain, neck pain, radicular pain.  She denies any significant medical history aside from hyperlipidemia and hypertension.  No history of diabetes with last A1c 5.6.  Thyroid disease, smoking history.  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  She enjoys fishing and playing with her grandkids in her spare time..                ROS: All systems reviewed are negative as they relate to the chief complaint within the history of present illness.  Patient denies fevers or chills.  Assessment & Plan: Visit Diagnoses:  1. Arthritis of left acromioclavicular joint     Plan: Patient is a 51 year old female who presents for evaluation of left shoulder pain.  She has atraumatic onset of pain over the last several weeks that is gone to the point where she cannot lay on her left side.  Pain is worse with abduction and crossarm adduction.  She has tenderness localizing to the [facility] joint with radiographs taken today demonstrate no acute osseous abnormality but she  does have some degenerative changes mild to moderately in the [facility] joint.  After discussion of options, she would like to try ultrasound-guided [facility] joint injection.  This was administered and patient tolerated the procedure well.  We will see how this does for her.  If she has improvement of symptoms that is short-lived, could consider another injection or decision about distal clavicle excision.  If no relief at all for any period of time from this injection, patient Wilcoxen be experiencing early symptoms of frozen shoulder given the very slight reduction in passive range of motion of the left shoulder relative to the right but seems most of her pain localized to the [facility] joint today.  About 10 minutes after injection, patient did have fairly substantial relief of her symptoms with active motion overhead and with palpation over the [facility] joint.  Follow-Up Instructions: No follow-ups on file.   Orders:  Orders Placed This Encounter  Procedures   XR Shoulder Left   US Guided Needle Placement - No Linked Charges   No orders of the defined types were placed in this encounter.     Procedures: Medium Joint Inj: L acromioclavicular on 07/10/2022 1:11 PM Indications: diagnostic evaluation and pain Details: 25 G 1.5 in needle, ultrasound-guided superior approach Medications: 3 mL lidocaine 1 %; 0.66 mL bupivacaine 0.25 %; 13.33 mg methylPREDNISolone acetate 40 MG/ML Outcome:  tolerated well, no immediate complications Procedure, treatment alternatives, risks and benefits explained, specific risks discussed. Consent was given by the patient. Immediately prior to procedure a time out was called to verify the correct patient, procedure, equipment, support staff and site/side marked as required. Patient was prepped and draped in the usual sterile fashion.       Clinical Data: No additional findings.  Objective: Vital Signs: There were no vitals taken for this visit.  Physical Exam:  Constitutional: Patient  appears well-developed HEENT:  Head: Normocephalic Eyes:EOM are normal Neck: Normal range of motion Cardiovascular: Normal rate Pulmonary/chest: Effort normal Neurologic: Patient is alert Skin: Skin is warm Psychiatric: Patient has normal mood and affect  Ortho Exam: Ortho exam demonstrates left shoulder with 70 degrees X rotation, 110 degrees abduction, 165 degrees forward flexion.  This compared with the right shoulder to 80 degrees X rotation, 120 degrees abduction, 180 degrees forward flexion.  She has moderate tenderness over the left [facility] joint with no tenderness over the right AC joint.  No tenderness over the bicipital groove.  Excellent rotator cuff strength of supra, infra, subscap.  No significant coarseness or grinding noted with passive motion of the shoulder./5 motor strength of EPL, FPL, grip strength testing, finger abduction, pronation/supination, bicep, tricep, deltoid.  Eksir nerve intact with deltoid firing.  2+ radial pulse of the left upper extremity.  Specialty Comments:  No specialty comments available.  Imaging: No results found.   PMFS History: Patient Active Problem List   Diagnosis Date Noted   OSA (obstructive sleep apnea) 04/24/2022   Aortic atherosclerosis ([place]) 01/15/2022   Diverticulosis 01/15/2022   Chronic constipation 01/15/2022   Ptosis of right eyelid 01/15/2022   Other fatigue 01/15/2022   Daytime somnolence 01/15/2022   BMI 40.0-44.9, adult ([place]) 12/01/2021   Impaired fasting glucose 12/01/2021   History of urinary tract infection 08/29/2021   Routine general medical examination at a health care facility 03/06/2019   Pure hypercholesterolemia 03/06/2019   Family history of skin cancer 03/06/2019   Prediabetes 11/10/2017   Gastroesophageal reflux disease without esophagitis 11/05/2017   Essential hypertension 10/24/2010   Anxiety 10/24/2010   Past Medical History:  Diagnosis Date   Allergy    Anemia    AS A CHILD   Anxiety     Cataract    BORN WITH IT   Cystitis, chronic    GERD (gastroesophageal reflux disease)    Gross hematuria    years ago, saw urology   Heart murmur    AS A CHILD   High cholesterol    Hypertension    Obesity    Pre-diabetes     Family History  Problem Relation Age of Onset   Hyperlipidemia Mother    Heart disease Father    Diabetes Father    Hypertension Father    Stroke Father 62   Heart failure Father    Cancer Maternal Grandmother        ovarian   Hypertension Paternal Grandmother    Diabetes Paternal Grandmother    Cancer Paternal Grandfather        skin   Colon cancer Neg Hx    Colon polyps Neg Hx    Crohn's disease Neg Hx    Esophageal cancer Neg Hx    Rectal cancer Neg Hx    Stomach cancer Neg Hx     Past Surgical History:  Procedure Laterality Date   ABDOMINAL HYSTERECTOMY     uterus and 1 ovary, still has 1  ovary   ADENOIDECTOMY     CARPAL TUNNEL RELEASE Bilateral    TONSILLECTOMY     ULNAR TUNNEL RELEASE Bilateral    Social History   Occupational History   Not on file  Tobacco Use   Smoking status: Former    Types: Cigarettes    Quit date: 06/06/2016    Years since quitting: 6.0    Passive exposure: Past   Smokeless tobacco: Never  Vaping Use   Vaping Use: Never used  Substance and Sexual Activity   Alcohol use: No   Drug use: No   Sexual activity: Yes    Birth control/protection: Surgical

## 2022-07-13 NOTE — Telephone Encounter (Signed)
I spoke with patient on the phone this evening.  She had pretty good relief when she left the office about 10 to 15 minutes after the injection.  Pain worsened on Sunday/Monday with some pain in the shoulder blade region and she does note some tingling in some of her fingers as well.  She does not really have any radicular pain down the arm or numbness/tingling in the rest of the arm.  No medial scapular border pain.  I discussed with her that we should have her come back into the office on 07/24/2022 so we can recheck her.  Domagala be experiencing cortisone flare so by then we should know if this injection has truly helped her or if not, Swing need to consider workup of her neck.  Can you make her appointment for this date?

## 2022-07-22 ENCOUNTER — Telehealth: Payer: Self-pay | Admitting: Internal Medicine

## 2022-07-22 NOTE — Telephone Encounter (Signed)
Form for [facility] has been completed by me. Waiting on Provider signature and to give back to patient

## 2022-07-23 NOTE — Telephone Encounter (Signed)
Sent to patient.

## 2022-07-23 NOTE — Telephone Encounter (Signed)
Coming back in your folder today per [facility]

## 2022-07-24 ENCOUNTER — Ambulatory Visit: Payer: No Typology Code available for payment source | Admitting: Surgical

## 2022-07-28 ENCOUNTER — Ambulatory Visit (INDEPENDENT_AMBULATORY_CARE_PROVIDER_SITE_OTHER): Payer: 59 | Admitting: Family Medicine

## 2022-07-28 ENCOUNTER — Encounter (INDEPENDENT_AMBULATORY_CARE_PROVIDER_SITE_OTHER): Payer: Self-pay | Admitting: Family Medicine

## 2022-07-28 VITALS — BP 122/83 | HR 95 | Temp 97.9°F | Ht 62.0 in | Wt 205.0 lb

## 2022-07-28 DIAGNOSIS — F32A Depression, unspecified: Secondary | ICD-10-CM

## 2022-07-28 DIAGNOSIS — E785 Hyperlipidemia, unspecified: Secondary | ICD-10-CM

## 2022-07-28 DIAGNOSIS — Z7985 Long-term (current) use of injectable non-insulin antidiabetic drugs: Secondary | ICD-10-CM

## 2022-07-28 DIAGNOSIS — E1159 Type 2 diabetes mellitus with other circulatory complications: Secondary | ICD-10-CM | POA: Diagnosis not present

## 2022-07-28 DIAGNOSIS — Z7984 Long term (current) use of oral hypoglycemic drugs: Secondary | ICD-10-CM

## 2022-07-28 DIAGNOSIS — R5383 Other fatigue: Secondary | ICD-10-CM

## 2022-07-28 DIAGNOSIS — I152 Hypertension secondary to endocrine disorders: Secondary | ICD-10-CM | POA: Diagnosis not present

## 2022-07-28 DIAGNOSIS — F419 Anxiety disorder, unspecified: Secondary | ICD-10-CM

## 2022-07-28 DIAGNOSIS — E1169 Type 2 diabetes mellitus with other specified complication: Secondary | ICD-10-CM | POA: Diagnosis not present

## 2022-07-28 DIAGNOSIS — Z6837 Body mass index (BMI) 37.0-37.9, adult: Secondary | ICD-10-CM | POA: Diagnosis not present

## 2022-07-28 DIAGNOSIS — E1165 Type 2 diabetes mellitus with hyperglycemia: Secondary | ICD-10-CM | POA: Diagnosis not present

## 2022-07-28 DIAGNOSIS — R0602 Shortness of breath: Secondary | ICD-10-CM

## 2022-07-28 DIAGNOSIS — G4733 Obstructive sleep apnea (adult) (pediatric): Secondary | ICD-10-CM

## 2022-07-29 ENCOUNTER — Encounter (INDEPENDENT_AMBULATORY_CARE_PROVIDER_SITE_OTHER): Payer: Self-pay | Admitting: Family Medicine

## 2022-07-29 LAB — CBC WITH DIFFERENTIAL/PLATELET
Basophils Absolute: 0.1 10*3/uL (ref 0.0–0.2)
Basos: 1 %
EOS (ABSOLUTE): 0.5 10*3/uL — ABNORMAL HIGH (ref 0.0–0.4)
Eos: 5 %
Hematocrit: 39.6 % (ref 34.0–46.6)
Hemoglobin: 12.9 g/dL (ref 11.1–15.9)
Immature Grans (Abs): 0 10*3/uL (ref 0.0–0.1)
Immature Granulocytes: 0 %
Lymphocytes Absolute: 2.1 10*3/uL (ref 0.7–3.1)
Lymphs: 22 %
MCH: 28.5 pg (ref 26.6–33.0)
MCHC: 32.6 g/dL (ref 31.5–35.7)
MCV: 87 fL (ref 79–97)
Monocytes Absolute: 0.4 10*3/uL (ref 0.1–0.9)
Monocytes: 4 %
Neutrophils Absolute: 6.5 10*3/uL (ref 1.4–7.0)
Neutrophils: 68 %
Platelets: 317 10*3/uL (ref 150–450)
RBC: 4.53 x10E6/uL (ref 3.77–5.28)
RDW: 12.9 % (ref 11.7–15.4)
WBC: 9.5 10*3/uL (ref 3.4–10.8)

## 2022-07-29 LAB — FOLATE: Folate: 5.7 ng/mL (ref >3.0)

## 2022-07-29 LAB — COMPREHENSIVE METABOLIC PANEL
ALT: 23 IU/L (ref 0–32)
AST: 18 IU/L (ref 0–40)
Albumin/Globulin Ratio: 1.7 (ref 1.2–2.2)
Albumin: 4.6 g/dL (ref 3.9–4.9)
Alkaline Phosphatase: 104 IU/L (ref 44–121)
BUN/Creatinine Ratio: 12 (ref 9–23)
BUN: 9 mg/dL (ref 6–24)
Bilirubin Total: 0.4 mg/dL (ref 0.0–1.2)
CO2: 22 mmol/L (ref 20–29)
Calcium: 9.2 mg/dL (ref 8.7–10.2)
Chloride: 101 mmol/L (ref 96–106)
Creatinine, Ser: 0.73 mg/dL (ref 0.57–1.00)
Globulin, Total: 2.7 g/dL (ref 1.5–4.5)
Glucose: 94 mg/dL (ref 70–99)
Potassium: 4.1 mmol/L (ref 3.5–5.2)
Sodium: 138 mmol/L (ref 134–144)
Total Protein: 7.3 g/dL (ref 6.0–8.5)
eGFR: 101 mL/min/{1.73_m2} (ref >59)

## 2022-07-29 LAB — MICROALBUMIN / CREATININE URINE RATIO
Creatinine, Urine: 190.3 mg/dL
Microalb/Creat Ratio: 16 mg/g creat (ref 0–29)
Microalbumin, Urine: 30.2 ug/mL

## 2022-07-29 LAB — LIPID PANEL WITH LDL/HDL RATIO
Cholesterol, Total: 156 mg/dL (ref 100–199)
HDL: 45 mg/dL
LDL Chol Calc (NIH): 89 mg/dL (ref 0–99)
LDL/HDL Ratio: 2 ratio (ref 0.0–3.2)
Triglycerides: 120 mg/dL (ref 0–149)
VLDL Cholesterol Cal: 22 mg/dL (ref 5–40)

## 2022-07-29 LAB — T4, FREE: Free T4: 1.06 ng/dL (ref 0.82–1.77)

## 2022-07-29 LAB — VITAMIN D 25 HYDROXY (VIT D DEFICIENCY, FRACTURES): Vit D, 25-Hydroxy: 28.3 ng/mL — ABNORMAL LOW (ref 30.0–100.0)

## 2022-07-29 LAB — INSULIN, RANDOM: INSULIN: 19.9 u[IU]/mL (ref 2.6–24.9)

## 2022-07-29 LAB — T3: T3, Total: 100 ng/dL (ref 71–180)

## 2022-07-29 LAB — VITAMIN B12: Vitamin B-12: 323 pg/mL (ref 232–1245)

## 2022-07-29 LAB — TSH: TSH: 1.27 u[IU]/mL (ref 0.450–4.500)

## 2022-07-29 NOTE — Telephone Encounter (Signed)
Please put in Epic, Thanks

## 2022-08-03 ENCOUNTER — Other Ambulatory Visit (HOSPITAL_BASED_OUTPATIENT_CLINIC_OR_DEPARTMENT_OTHER): Payer: Self-pay

## 2022-08-03 MED ORDER — WEGOVY 1.7 MG/0.75ML ~~LOC~~ SOAJ
1.7000 mg | SUBCUTANEOUS | 0 refills | Status: DC
  Filled 2022-08-03: qty 3, 28d supply, fill #0

## 2022-08-07 NOTE — Progress Notes (Signed)
Chief Complaint:   OBESITY Jessica [name] (MR# [id number]) is a 52 y.o. female who presents for evaluation and treatment of obesity and related comorbidities. Current BMI is Body mass index is 37.49 kg/m. Jessica [name] has been struggling with her weight for many years and has been unsuccessful in either losing weight, maintaining weight loss, or reaching her healthy weight goal.  Jessica [name]'s daughter is a patient here.  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  Did get weight loss medication, Phentermine and Optavia in the past.  Eating out 7-8 times a week-lunch is catered or she orders out at [employer].  Skips breakfast daily.  Does not like a lot of fruit.  Maybe sweet tea in the am.  Lunch [employer] meal with sweet tea (only eats fries) then snack of naps.  Dinner is [employer] sandwich or what she had 6 for her husband ([place] hamburger helper).  Jessica [name] is currently in the action stage of change and ready to dedicate time achieving and maintaining a healthier weight. Jessica [name] is interested in becoming our patient and working on intensive lifestyle modifications including (but not limited to) diet and exercise for weight loss.  Disney's habits were reviewed today and are as follows: Her family eats meals together, she thinks her family will eat healthier with her, her desired weight loss is 40 lbs, she started gaining weight after hysterectomy and childbirth, her heaviest weight ever was 223 pounds, she has significant food cravings issues, she skips meals frequently, she frequently makes poor food choices, and she struggles with emotional eating.  Depression Screen Jessica [name]'s Food and Mood (modified PHQ-9) score was 12.     01/15/2022   12:08 PM  Depression screen PHQ 2/9  Decreased Interest 0  Down, Depressed, Hopeless 0  PHQ - 2 Score 0   Subjective:   1. Other fatigue Jessica [name] denies daytime somnolence and admits to waking up still tired. Patient has a history of symptoms of  daytime fatigue. Jessica [name] generally gets 9 hours of sleep per night, and states that she has generally restful sleep. Snoring is present. Apneic episodes are not present. Epworth Sleepiness Score is 5.    2. SOBOE (shortness of breath on exertion) Jessica [name] notes increasing shortness of breath with exercising and seems to be worsening over time with weight gain. She notes getting out of breath sooner with activity than she used to. This has not gotten worse recently. Jessica [name] denies shortness of breath at rest or orthopnea.   3. Hypertension associated with diabetes (Jessica [name]) EKG-sinus tach with poor R wave progression.  Diagnosed 10 years ago.  Blood pressure is controlled today as she is on lisinopril 20 mg daily.  4. Type 2 diabetes mellitus with hyperglycemia, without long-term current use of insulin (HCC) Last A1c was 6.3 (previously 7.1).  Last year was last eye exam.  Diagnosed 4 years ago.  On Wegovy 1 mg weekly and Metformin as well.  5. Hyperlipidemia associated with type 2 diabetes mellitus (HCC) Last LDL of 90, trig 102, HDL of 41.  On Lipitor 10 mg daily.  6. OSA (obstructive sleep apnea) Jessica [name] has had a CPAP and wears it nightly.  7. Anxiety and depression Jessica [name] is on Celexa.  Symptoms of anxiety worse when patient is a passenger in a car.  Assessment/Plan:   1. Other fatigue We will obtain labs today. Jessica [name] does feel that her weight is causing her energy to be lower than it should be. Fatigue Jessica [name] be related to  obesity, depression or many other causes. Labs will be ordered, and in the meanwhile, [name] will focus on self care including making healthy food choices, increasing physical activity and focusing on stress reduction.   - EKG 12-Lead - Vitamin B12 - Folate - VITAMIN D 25 Hydroxy (Vit-D Deficiency, Fractures) - TSH - T4, free - T3  2. SOBOE (shortness of breath on exertion) We will obtain labs today.  Jessica [name] does feel that she gets out of breath more easily that she used to  when she exercises. Jessica [name]'s shortness of breath appears to be obesity related and exercise induced. She has agreed to work on weight loss and gradually increase exercise to treat her exercise induced shortness of breath. Will continue to monitor closely.   - CBC with Differential/Platelet  3. Hypertension associated with diabetes (Jessica [name]) We will obtain labs today.  - Comprehensive metabolic panel  4. Type 2 diabetes mellitus with hyperglycemia, without long-term current use of insulin (HCC) We will obtain labs today.  - Insulin, random - Urine Microalbumin w/creat. ratio  5. Hyperlipidemia associated with type 2 diabetes mellitus (Jessica [name]) We will obtain labs today.  - Lipid Panel With LDL/HDL Ratio  6. OSA (obstructive sleep apnea) Follow-up with sleep and frequent at upcoming appointment.  7. Anxiety and depression Follow up on symptoms at next appointment.  8. Class 2 severe obesity with serious comorbidity and body mass index (BMI) of 37.0 to 37.9 in adult, unspecified obesity type (HCC) Jessica [name] is currently in the action stage of change and her goal is to continue with weight loss efforts. I recommend Jessica [name] begin the structured treatment plan as follows:  She has agreed to the Category 3 Plan.  Exercise goals: No exercise has been prescribed at this time.   Behavioral modification strategies: increasing lean protein intake, meal planning and cooking strategies, keeping healthy foods in the home, and planning for success.  She was informed of the importance of frequent follow-up visits to maximize her success with intensive lifestyle modifications for her multiple health conditions. She was informed we would discuss her lab results at her next visit unless there is a critical issue that needs to be addressed sooner. Jessica [name] agreed to keep her next visit at the agreed upon time to discuss these results.  Objective:   Blood pressure 122/83, pulse 95, temperature 97.9 F (36.6 C),  height '5\' 2"'$  (1.575 m), weight 205 lb (93 kg), SpO2 96 %. Body mass index is 37.49 kg/m.  EKG: Normal sinus rhythm, rate 101 bpm.  Indirect Calorimeter completed today shows a VO2 of 258 ml and a REE of 1786.  Her calculated basal metabolic rate is 5053 thus her basal metabolic rate is better than expected.  General: Cooperative, alert, well developed, in no acute distress. HEENT: Conjunctivae and lids unremarkable. Cardiovascular: Regular rhythm.  Lungs: Normal work of breathing. Neurologic: No focal deficits.   Lab Results  Component Value Date   CREATININE 0.73 07/28/2022   BUN 9 07/28/2022   NA 138 07/28/2022   K 4.1 07/28/2022   CL 101 07/28/2022   CO2 22 07/28/2022   Lab Results  Component Value Date   ALT 23 07/28/2022   AST 18 07/28/2022   ALKPHOS 104 07/28/2022   BILITOT 0.4 07/28/2022   Lab Results  Component Value Date   HGBA1C 6.3 (A) 06/26/2022   HGBA1C 7.1 (H) 01/15/2022   HGBA1C 6.4 (H) 11/21/2020   HGBA1C 6.0 (H) 03/05/2020   HGBA1C 6.0 (H) 09/19/2019   Lab  Results  Component Value Date   INSULIN 19.9 07/28/2022   Lab Results  Component Value Date   TSH 1.270 07/28/2022   Lab Results  Component Value Date   CHOL 156 07/28/2022   HDL 45 07/28/2022   LDLCALC 89 07/28/2022   TRIG 120 07/28/2022   CHOLHDL 3.7 01/15/2022   Lab Results  Component Value Date   WBC 9.5 07/28/2022   HGB 12.9 07/28/2022   HCT 39.6 07/28/2022   MCV 87 07/28/2022   PLT 317 07/28/2022   No results found for: "IRON", "TIBC", "FERRITIN"  Attestation Statements:   Reviewed by clinician on day of visit: allergies, medications, problem list, medical history, surgical history, family history, social history, and previous encounter notes.  Time spent on visit including pre-visit chart review and post-visit charting and care was 45 minutes.   I, Elnora Morrison, RMA am acting as transcriptionist for Coralie Common, MD.  This is the patient's first visit at [facility]. The patient's NEW PATIENT PACKET was reviewed at length. Included in the packet: current and past health history, medications, allergies, ROS, gynecologic history (women only), surgical history, family history, social history, weight history, weight loss surgery history (for those that have had weight loss surgery), nutritional evaluation, mood and food questionnaire, PHQ9, Epworth questionnaire, sleep habits questionnaire, patient life and health improvement goals questionnaire. These will all be scanned into the patient's chart under media.   During the visit, I independently reviewed the patient's EKG, bioimpedance scale results, and indirect calorimeter results. I used this information to tailor a meal plan for the patient that will help her to lose weight and will improve her obesity-related conditions going forward. I performed a medically necessary appropriate examination and/or evaluation. I discussed the assessment and treatment plan with the patient. The patient was provided an opportunity to ask questions and all were answered. The patient agreed with the plan and demonstrated an understanding of the instructions. Labs were ordered at this visit and will be reviewed at the next visit unless more critical results need to be addressed immediately. Clinical information was updated and documented in the EMR.    I have reviewed the above documentation for accuracy and completeness, and I agree with the above. - Coralie Common, MD

## 2022-08-09 ENCOUNTER — Telehealth: Payer: Self-pay | Admitting: Medical

## 2022-08-09 ENCOUNTER — Other Ambulatory Visit (HOSPITAL_BASED_OUTPATIENT_CLINIC_OR_DEPARTMENT_OTHER): Payer: Self-pay

## 2022-08-09 NOTE — Telephone Encounter (Signed)
P.A. [place]

## 2022-08-10 ENCOUNTER — Other Ambulatory Visit (HOSPITAL_BASED_OUTPATIENT_CLINIC_OR_DEPARTMENT_OTHER): Payer: Self-pay

## 2022-08-11 ENCOUNTER — Encounter (INDEPENDENT_AMBULATORY_CARE_PROVIDER_SITE_OTHER): Payer: Self-pay | Admitting: Family Medicine

## 2022-08-11 ENCOUNTER — Ambulatory Visit (INDEPENDENT_AMBULATORY_CARE_PROVIDER_SITE_OTHER): Payer: 59 | Admitting: Family Medicine

## 2022-08-11 ENCOUNTER — Other Ambulatory Visit (HOSPITAL_BASED_OUTPATIENT_CLINIC_OR_DEPARTMENT_OTHER): Payer: Self-pay

## 2022-08-11 VITALS — BP 148/96 | HR 96 | Temp 98.2°F | Ht 62.0 in | Wt 205.0 lb

## 2022-08-11 DIAGNOSIS — E669 Obesity, unspecified: Secondary | ICD-10-CM

## 2022-08-11 DIAGNOSIS — E559 Vitamin D deficiency, unspecified: Secondary | ICD-10-CM

## 2022-08-11 DIAGNOSIS — E1159 Type 2 diabetes mellitus with other circulatory complications: Secondary | ICD-10-CM

## 2022-08-11 DIAGNOSIS — I152 Hypertension secondary to endocrine disorders: Secondary | ICD-10-CM | POA: Diagnosis not present

## 2022-08-11 DIAGNOSIS — Z7984 Long term (current) use of oral hypoglycemic drugs: Secondary | ICD-10-CM | POA: Diagnosis not present

## 2022-08-11 DIAGNOSIS — E1169 Type 2 diabetes mellitus with other specified complication: Secondary | ICD-10-CM | POA: Diagnosis not present

## 2022-08-11 DIAGNOSIS — E1165 Type 2 diabetes mellitus with hyperglycemia: Secondary | ICD-10-CM

## 2022-08-11 DIAGNOSIS — E785 Hyperlipidemia, unspecified: Secondary | ICD-10-CM | POA: Diagnosis not present

## 2022-08-11 DIAGNOSIS — Z6837 Body mass index (BMI) 37.0-37.9, adult: Secondary | ICD-10-CM | POA: Diagnosis not present

## 2022-08-11 MED ORDER — VITAMIN D (ERGOCALCIFEROL) 1.25 MG (50000 UNIT) PO CAPS
50000.0000 [IU] | ORAL_CAPSULE | ORAL | 0 refills | Status: DC
  Filled 2022-08-11: qty 4, 28d supply, fill #0

## 2022-08-12 ENCOUNTER — Other Ambulatory Visit (HOSPITAL_BASED_OUTPATIENT_CLINIC_OR_DEPARTMENT_OTHER): Payer: Self-pay

## 2022-08-12 MED ORDER — WEGOVY 1 MG/0.5ML ~~LOC~~ SOAJ
1.0000 mg | SUBCUTANEOUS | 1 refills | Status: DC
  Filled 2022-08-12: qty 2, 28d supply, fill #0
  Filled 2022-08-22 – 2022-09-11 (×3): qty 2, 28d supply, fill #1

## 2022-08-12 NOTE — Telephone Encounter (Signed)
Sent in '1mg'$  for pt as she had side effects with the 1.'7mg'$ 

## 2022-08-15 NOTE — Telephone Encounter (Signed)
P.A. WEGOVY approved til 08/13/23, sent mychart message

## 2022-08-18 ENCOUNTER — Other Ambulatory Visit (HOSPITAL_BASED_OUTPATIENT_CLINIC_OR_DEPARTMENT_OTHER): Payer: Self-pay

## 2022-08-19 ENCOUNTER — Telehealth: Payer: Self-pay | Admitting: Internal Medicine

## 2022-08-19 NOTE — Progress Notes (Signed)
Chief Complaint:   OBESITY Jessica [name] is here to discuss her progress with her obesity treatment plan along with follow-up of her obesity related diagnoses. Jessica [name] is on the Category 3 Plan and states she is following her eating plan approximately 0% of the time. Jessica [name] states she is exercising 0 minutes 0 times per week.  Today's visit was #: 2 Starting weight: 205 lbs Starting date: 07/28/2022 Today's weight: 205 lbs Today's date: 08/11/2022 Total lbs lost to date: 0 lbs Total lbs lost since last in-office visit: 0  Interim History: Jessica [name] stopped Wegovy since last appointment.  She really could not consistently get food in due to GI side effects.  Next few weeks her goal is to get more food in consistently particularly food from plan.  Plans to have all food in the house by this week.  Subjective:   1. Hypertension associated with diabetes ([place]) Kimyah's blood pressure slightly elevated today.  Denies chest pain, chest pressure and headache.  On Lisinopril 2 times a day.  Denies chest pain, chest pressure and headache.  2. Type 2 diabetes mellitus with hyperglycemia, without long-term current use of insulin ([place]) Jessica [name] is on Metformin.  Last A1c was 6.3, insulin was 19.9.  Could not tolerate 1.7 mg of Wegovy.  3. Hyperlipidemia associated with type 2 diabetes mellitus (HCC) LDL of 89, Trigly of 120, HDL of 45.  Not on statin.  4. Vitamin D deficiency Jessica [name] not on Vit D.  Denies any nausea, vomiting or muscle weakness.  She notes fatigue.  Assessment/Plan:   1. Hypertension associated with diabetes ([place]) Follow up with blood pressure at next appointment; No change in medications or doses.  2. Type 2 diabetes mellitus with hyperglycemia, without long-term current use of insulin (HCC) Continue Metformin.  Repeat labs in 3 months.  3. Hyperlipidemia associated with type 2 diabetes mellitus ([place]) Repeat labs in 3 months.  4. Vitamin D deficiency Start Vit D 50k IU once a week for  1 month with 0 refills.  Repeat labs in 3 months.  -Start Vitamin D, Ergocalciferol, (DRISDOL) 1.25 MG (50000 UNIT) CAPS capsule; Take 1 capsule (50,000 Units total) by mouth every 7 (seven) days.  Dispense: 4 capsule; Refill: 0  5. Obesity with current BMI of 37.6 Jessica [name] is currently in the action stage of change. As such, her goal is to continue with weight loss efforts. She has agreed to the Category 3 Plan.   Exercise goals: No exercise has been prescribed at this time.  Behavioral modification strategies: increasing lean protein intake, meal planning and cooking strategies, keeping healthy foods in the home, and planning for success.  Sundy has agreed to follow-up with our clinic in 2 weeks. She was informed of the importance of frequent follow-up visits to maximize her success with intensive lifestyle modifications for her multiple health conditions.   Objective:   Blood pressure (!) 148/96, pulse 96, temperature 98.2 F (36.8 C), height '5\' 2"'$  (1.575 m), weight 205 lb (93 kg), SpO2 97 %. Body mass index is 37.49 kg/m.  General: Cooperative, alert, well developed, in no acute distress. HEENT: Conjunctivae and lids unremarkable. Cardiovascular: Regular rhythm.  Lungs: Normal work of breathing. Neurologic: No focal deficits.   Lab Results  Component Value Date   CREATININE 0.73 07/28/2022   BUN 9 07/28/2022   NA 138 07/28/2022   K 4.1 07/28/2022   CL 101 07/28/2022   CO2 22 07/28/2022   Lab Results  Component Value Date  ALT 23 07/28/2022   AST 18 07/28/2022   ALKPHOS 104 07/28/2022   BILITOT 0.4 07/28/2022   Lab Results  Component Value Date   HGBA1C 6.3 (A) 06/26/2022   HGBA1C 7.1 (H) 01/15/2022   HGBA1C 6.4 (H) 11/21/2020   HGBA1C 6.0 (H) 03/05/2020   HGBA1C 6.0 (H) 09/19/2019   Lab Results  Component Value Date   INSULIN 19.9 07/28/2022   Lab Results  Component Value Date   TSH 1.270 07/28/2022   Lab Results  Component Value Date   CHOL 156  07/28/2022   HDL 45 07/28/2022   LDLCALC 89 07/28/2022   TRIG 120 07/28/2022   CHOLHDL 3.7 01/15/2022   Lab Results  Component Value Date   VD25OH 28.3 (L) 07/28/2022   VD25OH 31.9 11/06/2017   Lab Results  Component Value Date   WBC 9.5 07/28/2022   HGB 12.9 07/28/2022   HCT 39.6 07/28/2022   MCV 87 07/28/2022   PLT 317 07/28/2022   No results found for: "IRON", "TIBC", "FERRITIN"  Attestation Statements:   Reviewed by clinician on day of visit: allergies, medications, problem list, medical history, surgical history, family history, social history, and previous encounter notes.  Time spent on visit including pre-visit chart review and post-visit care and charting was 40 minutes.   I, Elnora Morrison, RMA am acting as transcriptionist for Coralie Common, MD.  I have reviewed the above documentation for accuracy and completeness, and I agree with the above. - Coralie Common, MD

## 2022-08-19 NOTE — Telephone Encounter (Signed)
Pt has appt tomorrow

## 2022-08-19 NOTE — Telephone Encounter (Signed)
Pt needs TB blood test and Hep B testing for school. [place] for her to schedule a nurse visit

## 2022-08-20 ENCOUNTER — Encounter: Payer: Self-pay | Admitting: Medical

## 2022-08-20 ENCOUNTER — Encounter: Payer: Self-pay | Admitting: *Deleted

## 2022-08-20 ENCOUNTER — Ambulatory Visit (INDEPENDENT_AMBULATORY_CARE_PROVIDER_SITE_OTHER): Payer: 59 | Admitting: Medical

## 2022-08-20 VITALS — BP 122/82 | HR 106 | Temp 98.3°F | Wt 213.8 lb

## 2022-08-20 DIAGNOSIS — Z139 Encounter for screening, unspecified: Secondary | ICD-10-CM | POA: Diagnosis not present

## 2022-08-20 DIAGNOSIS — Z0289 Encounter for other administrative examinations: Secondary | ICD-10-CM

## 2022-08-20 DIAGNOSIS — Z111 Encounter for screening for respiratory tuberculosis: Secondary | ICD-10-CM | POA: Diagnosis not present

## 2022-08-20 DIAGNOSIS — Z7185 Encounter for immunization safety counseling: Secondary | ICD-10-CM

## 2022-08-20 NOTE — Progress Notes (Signed)
Subjective:  Jessica [name] is a 52 y.o. female who presents for Chief Complaint  Patient presents with   other    Discuss vaccines for school, tb blood test and Hep B, has some congestion, green mucous, allergy related,      Here for form completion.  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  Needs vaccine form completed.  She has some health at work and vaccine records with her.  No other aggravating or relieving factors.    No other c/o.  The following portions of the patient's history were reviewed and updated as appropriate: allergies, current medications, past family history, past medical history, past social history, past surgical history and problem list.  ROS Otherwise as in subjective above  Objective: BP 122/82   Pulse (!) 106   Temp 98.3 F (36.8 C)   Wt 213 lb 12.8 oz (97 kg)   BMI 39.10 kg/m   General appearance: alert, no distress, well developed, well nourished    Assessment: Encounter Diagnoses  Name Primary?   Encounter for completion of form with patient Yes   Screening for tuberculosis    Screening for condition    Vaccine counseling      Plan: I have updated parts of the College vaccine record form.  Labs below will be needed to complete the form.  We discussed that if she is not immune to hepatitis B we will use the combo hep A/ B vaccine to also give her the hepatitis A protection.  For her form the hepatitis A vaccine was optional.  Follow-up pending lab results   [name] was seen today for other.  Diagnoses and all orders for this visit:  Encounter for completion of form with patient -     Hepatitis B surface antibody,quantitative -     Cancel: QuantiFERON-TB Gold Plus -     QuantiFERON-TB Gold Plus  Screening for tuberculosis -     Cancel: QuantiFERON-TB Gold Plus -     QuantiFERON-TB Gold Plus  Screening for condition -     Hepatitis B surface antibody,quantitative  Vaccine counseling   Follow up:  pending labs

## 2022-08-21 LAB — HEPATITIS B SURFACE ANTIBODY, QUANTITATIVE: Hepatitis B Surf Ab Quant: 4.6 m[IU]/mL — ABNORMAL LOW (ref >9.9)

## 2022-08-21 NOTE — Progress Notes (Signed)
Pending tuberculosis screening QuantiFERON test.  She is not immune to hepatitis B.  At her convenience she can come in for Hepsilav B vaccine x 2 doses 2 months apart, and hepatitis A vaccine 2 doses, 6 months apart

## 2022-08-22 ENCOUNTER — Other Ambulatory Visit: Payer: Self-pay | Admitting: Medical

## 2022-08-22 ENCOUNTER — Other Ambulatory Visit (HOSPITAL_BASED_OUTPATIENT_CLINIC_OR_DEPARTMENT_OTHER): Payer: Self-pay

## 2022-08-22 DIAGNOSIS — F419 Anxiety disorder, unspecified: Secondary | ICD-10-CM

## 2022-08-22 DIAGNOSIS — I10 Essential (primary) hypertension: Secondary | ICD-10-CM

## 2022-08-22 DIAGNOSIS — E7800 Pure hypercholesterolemia, unspecified: Secondary | ICD-10-CM

## 2022-08-22 DIAGNOSIS — R7303 Prediabetes: Secondary | ICD-10-CM

## 2022-08-22 DIAGNOSIS — G4700 Insomnia, unspecified: Secondary | ICD-10-CM

## 2022-08-24 ENCOUNTER — Other Ambulatory Visit: Payer: Self-pay

## 2022-08-24 ENCOUNTER — Other Ambulatory Visit (HOSPITAL_BASED_OUTPATIENT_CLINIC_OR_DEPARTMENT_OTHER): Payer: Self-pay

## 2022-08-24 LAB — QUANTIFERON-TB GOLD PLUS
QuantiFERON Mitogen Value: >10 IU/mL
QuantiFERON Nil Value: 0 IU/mL
QuantiFERON TB1 Ag Value: 0 IU/mL
QuantiFERON TB2 Ag Value: 0 IU/mL
QuantiFERON-TB Gold Plus: NEGATIVE

## 2022-08-24 MED ORDER — ATORVASTATIN CALCIUM 10 MG PO TABS
10.0000 mg | ORAL_TABLET | Freq: Every day | ORAL | 0 refills | Status: DC
  Filled 2022-08-24: qty 90, 90d supply, fill #0

## 2022-08-24 MED ORDER — CITALOPRAM HYDROBROMIDE 20 MG PO TABS
30.0000 mg | ORAL_TABLET | Freq: Every day | ORAL | 0 refills | Status: DC
  Filled 2022-08-24: qty 135, 90d supply, fill #0

## 2022-08-24 MED ORDER — LISINOPRIL 20 MG PO TABS
ORAL_TABLET | ORAL | 0 refills | Status: DC
  Filled 2022-08-24: qty 91, 61d supply, fill #0
  Filled 2022-08-24: qty 44, 29d supply, fill #0

## 2022-08-24 MED ORDER — METFORMIN HCL 500 MG PO TABS
500.0000 mg | ORAL_TABLET | Freq: Two times a day (BID) | ORAL | 0 refills | Status: DC
  Filled 2022-08-24: qty 180, 90d supply, fill #0

## 2022-08-24 NOTE — Progress Notes (Signed)
See other result message, still pending QuantiFERON tuberculosis test.  I have put the vaccine form in the folder to you to hold until we have all results

## 2022-08-25 NOTE — Progress Notes (Signed)
QuantiFERON tuberculosis test negative.  Complete the form and fax back or return to patient

## 2022-08-28 ENCOUNTER — Ambulatory Visit: Payer: Self-pay | Admitting: Medical

## 2022-09-03 ENCOUNTER — Other Ambulatory Visit (INDEPENDENT_AMBULATORY_CARE_PROVIDER_SITE_OTHER): Payer: 59

## 2022-09-03 DIAGNOSIS — Z23 Encounter for immunization: Secondary | ICD-10-CM | POA: Diagnosis not present

## 2022-09-04 ENCOUNTER — Other Ambulatory Visit (HOSPITAL_BASED_OUTPATIENT_CLINIC_OR_DEPARTMENT_OTHER): Payer: Self-pay

## 2022-09-11 ENCOUNTER — Other Ambulatory Visit (HOSPITAL_BASED_OUTPATIENT_CLINIC_OR_DEPARTMENT_OTHER): Payer: Self-pay

## 2022-10-02 ENCOUNTER — Other Ambulatory Visit (INDEPENDENT_AMBULATORY_CARE_PROVIDER_SITE_OTHER): Payer: 59

## 2022-10-02 DIAGNOSIS — Z7185 Encounter for immunization safety counseling: Secondary | ICD-10-CM | POA: Diagnosis not present

## 2022-10-09 ENCOUNTER — Other Ambulatory Visit: Payer: Self-pay | Admitting: Medical

## 2022-10-09 ENCOUNTER — Other Ambulatory Visit (HOSPITAL_BASED_OUTPATIENT_CLINIC_OR_DEPARTMENT_OTHER): Payer: Self-pay

## 2022-10-09 MED ORDER — WEGOVY 1 MG/0.5ML ~~LOC~~ SOAJ
1.0000 mg | SUBCUTANEOUS | 0 refills | Status: DC
  Filled 2022-10-09: qty 2, 28d supply, fill #0

## 2022-11-11 ENCOUNTER — Other Ambulatory Visit: Payer: Self-pay | Admitting: Medical

## 2022-11-11 ENCOUNTER — Other Ambulatory Visit: Payer: Self-pay

## 2022-11-11 ENCOUNTER — Other Ambulatory Visit (HOSPITAL_BASED_OUTPATIENT_CLINIC_OR_DEPARTMENT_OTHER): Payer: Self-pay

## 2022-11-11 DIAGNOSIS — F419 Anxiety disorder, unspecified: Secondary | ICD-10-CM

## 2022-11-11 DIAGNOSIS — G4700 Insomnia, unspecified: Secondary | ICD-10-CM

## 2022-11-11 DIAGNOSIS — E7800 Pure hypercholesterolemia, unspecified: Secondary | ICD-10-CM

## 2022-11-11 DIAGNOSIS — R7303 Prediabetes: Secondary | ICD-10-CM

## 2022-11-11 DIAGNOSIS — I10 Essential (primary) hypertension: Secondary | ICD-10-CM

## 2022-11-11 MED ORDER — METFORMIN HCL 500 MG PO TABS
500.0000 mg | ORAL_TABLET | Freq: Two times a day (BID) | ORAL | 1 refills | Status: DC
Start: 2022-11-11 — End: 2023-12-13
  Filled 2022-11-11 (×2): qty 180, 90d supply, fill #0

## 2022-11-11 MED ORDER — WEGOVY 1 MG/0.5ML ~~LOC~~ SOAJ
1.0000 mg | SUBCUTANEOUS | 2 refills | Status: DC
  Filled 2022-11-11: qty 2, 28d supply, fill #0
  Filled 2022-12-09: qty 2, 28d supply, fill #1

## 2022-11-11 MED ORDER — ATORVASTATIN CALCIUM 10 MG PO TABS
10.0000 mg | ORAL_TABLET | Freq: Every day | ORAL | 1 refills | Status: DC
Start: 2022-11-11 — End: 2023-05-11
  Filled 2022-11-11: qty 90, 90d supply, fill #0
  Filled 2023-05-11: qty 90, 90d supply, fill #1

## 2022-11-11 MED ORDER — LISINOPRIL 20 MG PO TABS
ORAL_TABLET | ORAL | 1 refills | Status: DC
Start: 2022-11-11 — End: 2023-08-26
  Filled 2022-11-11 (×2): qty 135, 90d supply, fill #0
  Filled 2023-05-17: qty 135, 90d supply, fill #1

## 2022-11-11 NOTE — Telephone Encounter (Signed)
Refill request last apt 08/20/22. 

## 2022-11-12 ENCOUNTER — Other Ambulatory Visit (HOSPITAL_BASED_OUTPATIENT_CLINIC_OR_DEPARTMENT_OTHER): Payer: Self-pay

## 2022-11-12 MED ORDER — CITALOPRAM HYDROBROMIDE 20 MG PO TABS
30.0000 mg | ORAL_TABLET | Freq: Every day | ORAL | 1 refills | Status: DC
Start: 2022-11-12 — End: 2023-08-26
  Filled 2022-11-12: qty 135, 90d supply, fill #0
  Filled 2023-05-17: qty 135, 90d supply, fill #1

## 2022-12-09 ENCOUNTER — Other Ambulatory Visit (HOSPITAL_BASED_OUTPATIENT_CLINIC_OR_DEPARTMENT_OTHER): Payer: Self-pay

## 2022-12-24 ENCOUNTER — Other Ambulatory Visit (HOSPITAL_BASED_OUTPATIENT_CLINIC_OR_DEPARTMENT_OTHER): Payer: Self-pay

## 2022-12-28 ENCOUNTER — Other Ambulatory Visit (HOSPITAL_BASED_OUTPATIENT_CLINIC_OR_DEPARTMENT_OTHER): Payer: Self-pay

## 2022-12-29 ENCOUNTER — Ambulatory Visit (INDEPENDENT_AMBULATORY_CARE_PROVIDER_SITE_OTHER): Payer: 59 | Admitting: Dermatology

## 2022-12-29 ENCOUNTER — Encounter: Payer: Self-pay | Admitting: Dermatology

## 2022-12-29 ENCOUNTER — Other Ambulatory Visit (HOSPITAL_BASED_OUTPATIENT_CLINIC_OR_DEPARTMENT_OTHER): Payer: Self-pay

## 2022-12-29 VITALS — BP 157/102

## 2022-12-29 DIAGNOSIS — L820 Inflamed seborrheic keratosis: Secondary | ICD-10-CM

## 2022-12-29 DIAGNOSIS — L723 Sebaceous cyst: Secondary | ICD-10-CM | POA: Diagnosis not present

## 2022-12-29 DIAGNOSIS — L739 Follicular disorder, unspecified: Secondary | ICD-10-CM | POA: Diagnosis not present

## 2022-12-29 MED ORDER — MUPIROCIN 2 % EX OINT
1.0000 | TOPICAL_OINTMENT | Freq: Every day | CUTANEOUS | 0 refills | Status: DC
Start: 2022-12-29 — End: 2023-07-27
  Filled 2022-12-29: qty 22, 22d supply, fill #0

## 2022-12-29 NOTE — Patient Instructions (Addendum)
Cryotherapy Aftercare  Wash gently with soap and water everyday.   Apply Vaseline and Band-Aid daily until healed.   Due to recent changes in healthcare laws, you Funari see results of your pathology and/or laboratory studies on MyChart before the doctors have had a chance to review them. We understand that in some cases there Carrigg be results that are confusing or concerning to you. Please understand that not all results are received at the same time and often the doctors Deleonardis need to interpret multiple results in order to provide you with the best plan of care or course of treatment. Therefore, we ask that you please give us 2 business days to thoroughly review all your results before contacting the office for clarification. Should we see a critical lab result, you will be contacted sooner.   If You Need Anything After Your Visit  If you have any questions or concerns for your doctor, please call our main line at [phone] If no one answers, please leave a voicemail as directed and we will return your call as soon as possible. Messages left after 4 pm will be answered the following business day.   You Bojarski also send us a message via MyChart. We typically respond to MyChart messages within 1-2 business days.  For prescription refills, please ask your pharmacy to contact our office. Our fax number is [phone].  If you have an urgent issue when the clinic is closed that cannot wait until the next business day, you can page your doctor at the number below.    Please note that while we do our best to be available for urgent issues outside of office hours, we are not available 24/7.   If you have an urgent issue and are unable to reach us, you Alemany choose to seek medical care at your doctor's office, retail clinic, urgent care center, or emergency room.  If you have a medical emergency, please immediately call 911 or go to the emergency department. In the event of inclement weather, please call our  main line at [phone] for an update on the status of any delays or closures.  Dermatology Medication Tips: Please keep the boxes that topical medications come in in order to help keep track of the instructions about where and how to use these. Pharmacies typically print the medication instructions only on the boxes and not directly on the medication tubes.   If your medication is too expensive, please contact our office at [phone] or send us a message through MyChart.   We are unable to tell what your co-pay for medications will be in advance as this is different depending on your insurance coverage. However, we Olinde be able to find a substitute medication at lower cost or fill out paperwork to get insurance to cover a needed medication.   If a prior authorization is required to get your medication covered by your insurance company, please allow us 1-2 business days to complete this process.  Drug prices often vary depending on where the prescription is filled and some pharmacies Bunt offer cheaper prices.  The website www.goodrx.com contains coupons for medications through different pharmacies. The prices here do not account for what the cost Corne be with help from insurance (it Eagles be cheaper with your insurance), but the website can give you the price if you did not use any insurance.  - You can print the associated coupon and take it with your prescription to the pharmacy.  - You Zimmers also   stop by our office during regular business hours and pick up a GoodRx coupon card.  - If you need your prescription sent electronically to a different pharmacy, notify our office through [facility] MyChart or by phone at [phone]     

## 2022-12-29 NOTE — Progress Notes (Signed)
   New Patient Visit   Subjective  Jessica [name] is a 52 y.o. female who presents for the following: Spot Check  Patient states she has spots located at the face and arms that she would like to have examined. Patient reports the areas have been there for  6  month(s). She reports the areas are bothersome and itchy. She states that the areas have not spread. Patient reports she has not previously been treated for these areas. Patient denies Hx of bx. Patient reports family history of skin cancer(s)(Dad and Paternal Grandfather).    The following portions of the chart were reviewed this encounter and updated as appropriate: medications, allergies, medical history  Review of Systems:  No other skin or systemic complaints except as noted in HPI or Assessment and Plan.  Objective  Well appearing patient in no apparent distress; mood and affect are within normal limits.  A full examination was performed including scalp, head, eyes, ears, nose, lips, neck, chest, axillae, abdomen, back, buttocks, bilateral upper extremities, bilateral lower extremities, hands, feet, fingers, toes, fingernails, and toenails. All findings within normal limits unless otherwise noted below.   A focused examination was performed of the following areas: Face and Left arm  Relevant exam findings are noted in the Assessment and Plan.       Assessment & Plan   Inflamed SEBORRHEIC KERATOSIS - Stuck-on, waxy, tan-brown papules and/or plaques  - Benign-appearing - Discussed benign etiology and prognosis. - Observe - Call for any changes  Treatment Plan: -Cryotherapy to effected areas on forehead, right cheek and left cheek -Apply Vichy Vitamin C topically daily to help with dark spot treatments  FOLLICULITIS Exam: Perifollicular erythematous papules and pustules underneath Left Nostril and Left Chin  Treatment Plan: -Secondary to recent application of makeup for a wedding.  -We will send in Mupirocin Ointment  mixed with OTC Cortisone to apply topically daily facial irration until area has cleared  Inflamed sebaceous cyst (5) Left Forearm - Posterior; Mid Forehead; Left Buccal Cheek; Right Forehead (2)  Destruction of lesion - Left Buccal Cheek, Left Forearm - Posterior, Mid Forehead, Right Forehead (2) Complexity: simple   Destruction method: cryotherapy   Timeout:  patient name, date of birth, surgical site, and procedure verified Cryotherapy cycles:  6 Outcome: patient tolerated procedure well with no complications   Post-procedure details: wound care instructions given     Return if symptoms worsen or fail to improve.  Documentation: I have reviewed the above documentation for accuracy and completeness, and I agree with the above.  Stasia Cavalier, am acting as scribe for Langston Reusing, DO.  Langston Reusing, DO

## 2023-01-14 ENCOUNTER — Encounter: Payer: Self-pay | Admitting: Dermatology

## 2023-01-14 DIAGNOSIS — L739 Follicular disorder, unspecified: Secondary | ICD-10-CM

## 2023-01-18 ENCOUNTER — Other Ambulatory Visit (HOSPITAL_BASED_OUTPATIENT_CLINIC_OR_DEPARTMENT_OTHER): Payer: Self-pay

## 2023-01-18 MED ORDER — DOXYCYCLINE HYCLATE 100 MG PO TABS
100.0000 mg | ORAL_TABLET | Freq: Two times a day (BID) | ORAL | 0 refills | Status: AC
Start: 2023-01-18 — End: 2023-01-25
  Filled 2023-01-18: qty 14, 7d supply, fill #0

## 2023-02-06 ENCOUNTER — Telehealth: Payer: 59 | Admitting: Family Medicine

## 2023-02-06 DIAGNOSIS — J40 Bronchitis, not specified as acute or chronic: Secondary | ICD-10-CM | POA: Diagnosis not present

## 2023-02-06 DIAGNOSIS — R059 Cough, unspecified: Secondary | ICD-10-CM

## 2023-02-06 MED ORDER — FLUTICASONE PROPIONATE 50 MCG/ACT NA SUSP
1.0000 | Freq: Two times a day (BID) | NASAL | 0 refills | Status: DC
Start: 2023-02-06 — End: 2023-09-03

## 2023-02-06 MED ORDER — DOXYCYCLINE HYCLATE 100 MG PO TABS
100.0000 mg | ORAL_TABLET | Freq: Two times a day (BID) | ORAL | 0 refills | Status: AC
Start: 2023-02-06 — End: 2023-02-17
  Filled 2023-02-09: qty 14, 7d supply, fill #0

## 2023-02-06 MED ORDER — BENZONATATE 100 MG PO CAPS
100.0000 mg | ORAL_CAPSULE | Freq: Three times a day (TID) | ORAL | 0 refills | Status: AC
Start: 2023-02-06 — End: 2023-02-17
  Filled 2023-02-09: qty 20, 7d supply, fill #0

## 2023-02-06 NOTE — Progress Notes (Signed)
E-Visit for Upper Respiratory Infection   We are sorry you are not feeling well.  Here is how we plan to help!  Based on what you have shared with me, it looks like you Odland have a viral upper respiratory infection.  Upper respiratory infections are caused by a large number of viruses; however, rhinovirus is the most common cause.   Symptoms vary from person to person, with common symptoms including sore throat, cough, fatigue or lack of energy and feeling of general discomfort.  A low-grade fever of up to 100.4 Keinath present, but is often uncommon.  Symptoms vary however, and are closely related to a person's age or underlying illnesses.  The most common symptoms associated with an upper respiratory infection are nasal discharge or congestion, cough, sneezing, headache and pressure in the ears and face.  These symptoms usually persist for about 3 to 10 days, but can last up to 2 weeks.  It is important to know that upper respiratory infections do not cause serious illness or complications in most cases.    Upper respiratory infections can be transmitted from person to person, with the most common method of transmission being a person's hands.  The virus is able to live on the skin and can infect other persons for up to 2 hours after direct contact.  Also, these can be transmitted when someone coughs or sneezes; thus, it is important to cover the mouth to reduce this risk.  To keep the spread of the illness at bay, good hand hygiene is very important.  This is an infection that is most likely caused by a virus. There are no specific treatments other than to help you with the symptoms until the infection runs its course.  We are sorry you are not feeling well.  Here is how we plan to help!   For nasal congestion, you Ryder use an oral decongestants such as Mucinex D or if you have glaucoma or high blood pressure use plain Mucinex.  Saline nasal spray or nasal drops can help and can safely be used as often as  needed for congestion.  For your congestion, I have prescribed Fluticasone nasal spray one spray in each nostril twice a day  If you do not have a history of heart disease, hypertension, diabetes or thyroid disease, prostate/bladder issues or glaucoma, you Treptow also use Sudafed to treat nasal congestion.  It is highly recommended that you consult with a pharmacist or your primary care physician to ensure this medication is safe for you to take.     If you have a cough, you Demilio use cough suppressants such as Delsym and Robitussin.  If you have glaucoma or high blood pressure, you can also use Coricidin HBP.   For cough I have prescribed for you A prescription cough medication called Tessalon Perles 100 mg. You Semple take 1-2 capsules every 8 hours as needed for cough Also sounds like you Milner have some bronchitis,  I will send some Doxycycline 100mg  twice a day for 7 days.  If you have a sore or scratchy throat, use a saltwater gargle-  to  teaspoon of salt dissolved in a 4-ounce to 8-ounce glass of warm water.  Gargle the solution for approximately 15-30 seconds and then spit.  It is important not to swallow the solution.  You can also use throat lozenges/cough drops and Chloraseptic spray to help with throat pain or discomfort.  Warm or cold liquids can also be helpful in relieving throat  pain.  For headache, pain or general discomfort, you can use Ibuprofen or Tylenol as directed.   Some authorities believe that zinc sprays or the use of Echinacea Narayanan shorten the course of your symptoms.   HOME CARE Only take medications as instructed by your medical team. Be sure to drink plenty of fluids. Water is fine as well as fruit juices, sodas and electrolyte beverages. You Cuyler want to stay away from caffeine or alcohol. If you are nauseated, try taking small sips of liquids. How do you know if you are getting enough fluid? Your urine should be a pale yellow or almost colorless. Get rest. Taking a steamy  shower or using a humidifier Bouton help nasal congestion and ease sore throat pain. You can place a towel over your head and breathe in the steam from hot water coming from a faucet. Using a saline nasal spray works much the same way. Cough drops, hard candies and sore throat lozenges Harmon ease your cough. Avoid close contacts especially the very young and the elderly Cover your mouth if you cough or sneeze Always remember to wash your hands.   GET HELP RIGHT AWAY IF: You develop worsening fever. If your symptoms do not improve within 10 days You develop yellow or green discharge from your nose over 3 days. You have coughing fits You develop a severe head ache or visual changes. You develop shortness of breath, difficulty breathing or start having chest pain Your symptoms persist after you have completed your treatment plan  MAKE SURE YOU  Understand these instructions. Will watch your condition. Will get help right away if you are not doing well or get worse.  Thank you for choosing an e-visit.  Your e-visit answers were reviewed by a board certified advanced clinical practitioner to complete your personal care plan. Depending upon the condition, your plan could have included both over the counter or prescription medications.  Please review your pharmacy choice. Make sure the pharmacy is open so you can pick up prescription now. If there is a problem, you Ourada contact your provider through [employer] and have the prescription routed to another pharmacy.  Your safety is important to [place]. If you have drug allergies check your prescription carefully.   For the next 24 hours you can use MyChart to ask questions about today's visit, request a non-urgent call back, or ask for a work or school excuse. You will get an email in the next two days asking about your experience. I hope that your e-visit has been valuable and will speed your recovery.  I have spent 5 minutes in review of e-visit  questionnaire, review and updating patient chart, medical decision making and response to patient.   Reed Pandy, PA-C

## 2023-02-09 ENCOUNTER — Other Ambulatory Visit (HOSPITAL_BASED_OUTPATIENT_CLINIC_OR_DEPARTMENT_OTHER): Payer: Self-pay

## 2023-02-09 MED ORDER — FLUTICASONE PROPIONATE 50 MCG/ACT NA SUSP
1.0000 | Freq: Two times a day (BID) | NASAL | 0 refills | Status: AC
  Filled 2023-02-09: qty 16, 30d supply, fill #0

## 2023-05-11 ENCOUNTER — Other Ambulatory Visit (HOSPITAL_BASED_OUTPATIENT_CLINIC_OR_DEPARTMENT_OTHER): Payer: Self-pay

## 2023-05-11 ENCOUNTER — Telehealth: Payer: Self-pay | Admitting: Internal Medicine

## 2023-05-11 DIAGNOSIS — E7800 Pure hypercholesterolemia, unspecified: Secondary | ICD-10-CM

## 2023-05-11 MED ORDER — ATORVASTATIN CALCIUM 10 MG PO TABS
10.0000 mg | ORAL_TABLET | Freq: Every day | ORAL | 0 refills | Status: DC
  Filled 2023-05-11 – 2023-09-24 (×4): qty 90, 90d supply, fill #0

## 2023-05-11 NOTE — Telephone Encounter (Signed)
Pt was notified she needs to make an appt.  Pt asked for a refill on lipitor

## 2023-05-17 ENCOUNTER — Telehealth: Payer: 59 | Admitting: Physician Assistant

## 2023-05-17 ENCOUNTER — Other Ambulatory Visit (HOSPITAL_BASED_OUTPATIENT_CLINIC_OR_DEPARTMENT_OTHER): Payer: Self-pay

## 2023-05-17 ENCOUNTER — Other Ambulatory Visit: Payer: Self-pay

## 2023-05-17 DIAGNOSIS — R3989 Other symptoms and signs involving the genitourinary system: Secondary | ICD-10-CM | POA: Diagnosis not present

## 2023-05-17 MED ORDER — CIPROFLOXACIN HCL 500 MG PO TABS
500.0000 mg | ORAL_TABLET | Freq: Two times a day (BID) | ORAL | 0 refills | Status: DC
Start: 2023-05-17 — End: 2023-05-21

## 2023-05-17 NOTE — Progress Notes (Signed)
E-Visit for Urinary Problems  We are sorry that you are not feeling well.  Here is how we plan to help!  Based on what you shared with me it looks like you most likely have a simple urinary tract infection.  A UTI (Urinary Tract Infection) is a bacterial infection of the bladder.  Most cases of urinary tract infections are simple to treat but a key part of your care is to encourage you to drink plenty of fluids and watch your symptoms carefully.  I have prescribed Cipro 500mg Take 1 tablet twice daily for 3 days.  Your symptoms should gradually improve. Call us if the burning in your urine worsens, you develop worsening fever, back pain or pelvic pain or if your symptoms do not resolve after completing the antibiotic.  Urinary tract infections can be prevented by drinking plenty of water to keep your body hydrated.  Also be sure when you wipe, wipe from front to back and don't hold it in!  If possible, empty your bladder every 4 hours.  HOME CARE Drink plenty of fluids Compete the full course of the antibiotics even if the symptoms resolve Remember, when you need to go.go. Holding in your urine can increase the likelihood of getting a UTI! GET HELP RIGHT AWAY IF: You cannot urinate You get a high fever Worsening back pain occurs You see blood in your urine You feel sick to your stomach or throw up You feel like you are going to pass out  MAKE SURE YOU  Understand these instructions. Will watch your condition. Will get help right away if you are not doing well or get worse.   Thank you for choosing an e-visit.  Your e-visit answers were reviewed by a board certified advanced clinical practitioner to complete your personal care plan. Depending upon the condition, your plan could have included both over the counter or prescription medications.  Please review your pharmacy choice. Make sure the pharmacy is open so you can pick up prescription now. If there is a problem, you Coriz  contact your provider through MyChart messaging and have the prescription routed to another pharmacy.  Your safety is important to us. If you have drug allergies check your prescription carefully.   For the next 24 hours you can use MyChart to ask questions about today's visit, request a non-urgent call back, or ask for a work or school excuse. You will get an email in the next two days asking about your experience. I hope that your e-visit has been valuable and will speed your recovery.  I have spent 5 minutes in review of e-visit questionnaire, review and updating patient chart, medical decision making and response to patient.   Hanley Rispoli M Jendayi Berling, PA-C  

## 2023-05-19 ENCOUNTER — Other Ambulatory Visit (HOSPITAL_BASED_OUTPATIENT_CLINIC_OR_DEPARTMENT_OTHER): Payer: Self-pay | Admitting: Family Medicine

## 2023-05-19 DIAGNOSIS — R3989 Other symptoms and signs involving the genitourinary system: Secondary | ICD-10-CM

## 2023-05-19 DIAGNOSIS — E7800 Pure hypercholesterolemia, unspecified: Secondary | ICD-10-CM

## 2023-05-19 MED ORDER — CEFTRIAXONE SODIUM 1 G IJ SOLR
1.0000 g | Freq: Once | INTRAMUSCULAR | Status: AC
Start: 2023-05-19 — End: 2023-05-19
  Administered 2023-05-19: 1 g via INTRAMUSCULAR

## 2023-05-19 NOTE — Progress Notes (Signed)
Patient requested rocephin injection due to 2 trials of antibiotics for UTI and no relief. Permission given by Alyson Reedy FNP-C to draw up medication and administer. Patient tolerated injection well. Will follow up with PCP if symptoms persist.

## 2023-05-21 ENCOUNTER — Other Ambulatory Visit (HOSPITAL_BASED_OUTPATIENT_CLINIC_OR_DEPARTMENT_OTHER): Payer: Self-pay | Admitting: Family Medicine

## 2023-05-21 ENCOUNTER — Other Ambulatory Visit (HOSPITAL_BASED_OUTPATIENT_CLINIC_OR_DEPARTMENT_OTHER): Payer: Self-pay

## 2023-05-21 DIAGNOSIS — R3989 Other symptoms and signs involving the genitourinary system: Secondary | ICD-10-CM

## 2023-05-21 MED ORDER — CIPROFLOXACIN HCL 500 MG PO TABS
500.0000 mg | ORAL_TABLET | Freq: Two times a day (BID) | ORAL | 0 refills | Status: AC
  Filled 2023-05-21: qty 8, 4d supply, fill #0

## 2023-06-17 ENCOUNTER — Encounter (HOSPITAL_BASED_OUTPATIENT_CLINIC_OR_DEPARTMENT_OTHER): Payer: Self-pay | Admitting: Family Medicine

## 2023-06-28 ENCOUNTER — Telehealth: Payer: 59 | Admitting: Physician Assistant

## 2023-06-28 DIAGNOSIS — R300 Dysuria: Secondary | ICD-10-CM

## 2023-06-28 DIAGNOSIS — R82998 Other abnormal findings in urine: Secondary | ICD-10-CM

## 2023-06-28 NOTE — Progress Notes (Signed)
Because of dark brown/red urine and need for urine culture and assessment of kidney function, I feel your condition warrants further evaluation and I recommend that you be seen in a face to face visit.   NOTE: There will be NO CHARGE for this eVisit   If you are having a true medical emergency please call 911.      For an urgent face to face visit, [place] has eight urgent care centers for your convenience:   NEW!! [facility] at [facility] Get Driving Directions [phone] [address], [address] [place], [address]    [facility] at [facility] Get Driving Directions [phone] [address] [place], [place] [address]   [facility] Health Urgent Care Center [facility]) Get Driving Directions [phone] [address] [place], [place] [address]  [facility] Center [facility]) Get Driving Directions [phone] [address] [place],  [place]  [address]  [facility] - at [employer]  [phone] [address] [place],  [place] [address]   [facility] Health Urgent Care at [facility] Get Driving Directions [phone] [address] [place], [place] [address]   [facility] Urgent Care at [facility] Get Driving Directions  [phone] [address] [place], [place] [address]   [facility] Health Urgent Care at [facility] Directions [phone] [address], [address] [place], [place] [address]  Your MyChart E-visit questionnaire answers were reviewed by a board certified advanced clinical practitioner to complete your personal care plan based on your specific symptoms.  Thank you for using e-Visits.

## 2023-07-27 ENCOUNTER — Other Ambulatory Visit (HOSPITAL_BASED_OUTPATIENT_CLINIC_OR_DEPARTMENT_OTHER): Payer: Self-pay

## 2023-07-27 ENCOUNTER — Encounter (HOSPITAL_BASED_OUTPATIENT_CLINIC_OR_DEPARTMENT_OTHER): Payer: Self-pay | Admitting: Family Medicine

## 2023-07-27 ENCOUNTER — Ambulatory Visit (INDEPENDENT_AMBULATORY_CARE_PROVIDER_SITE_OTHER): Payer: 59 | Admitting: Family Medicine

## 2023-07-27 VITALS — BP 146/110 | HR 92 | Temp 98.1°F | Ht 62.0 in | Wt 218.4 lb

## 2023-07-27 DIAGNOSIS — R051 Acute cough: Secondary | ICD-10-CM | POA: Diagnosis not present

## 2023-07-27 DIAGNOSIS — R0981 Nasal congestion: Secondary | ICD-10-CM

## 2023-07-27 MED ORDER — AZITHROMYCIN 250 MG PO TABS
ORAL_TABLET | ORAL | 0 refills | Status: AC
Start: 2023-07-27 — End: 2023-08-01
  Filled 2023-07-27: qty 6, 5d supply, fill #0

## 2023-07-27 MED ORDER — HYDROCODONE BIT-HOMATROP MBR 5-1.5 MG/5ML PO SOLN
5.0000 mL | Freq: Three times a day (TID) | ORAL | 0 refills | Status: DC | PRN
Start: 2023-07-27 — End: 2023-09-03
  Filled 2023-07-27: qty 120, 8d supply, fill #0

## 2023-07-27 NOTE — Progress Notes (Signed)
 Acute Office Visit  Subjective:    Patient ID: Jessica [name], female    DOB: 1971/01/24, 52 y.o.   MRN: [id number]  Chief Complaint  Patient presents with   Cough   Eye Drainage   Gisselle Yale is a pleasant 52 year old female patient who presents today for an acute visit. Presents today for an acute visit with complaints of productive cough, crusted eyes this morning, and nasal congestion. Symptoms have been present since last week.  Associated symptoms include: fever 101F over the weekend Pertinent negatives: N/V, abdominal pain, shortness of breath, wheezing, chest pain, ear pain/pressure, sore throat, headache, body aches   ROS: see HPI    Objective:    BP (!) 146/110 Comment: Repeat BP  Pulse 92   Temp 98.1 F (36.7 C) (Oral)   Ht 5' 2 (1.575 m)   Wt 218 lb 6.4 oz (99.1 kg)   SpO2 98%   BMI 39.95 kg/m   Physical Exam Vitals reviewed.  Constitutional:      Appearance: Normal appearance.  HENT:     Head: Normocephalic.     Right Ear: Hearing, tympanic membrane, ear canal and external ear normal.     Left Ear: Hearing, tympanic membrane, ear canal and external ear normal.     Nose: Mucosal edema and congestion present.     Right Turbinates: Swollen.     Left Turbinates: Swollen.     Right Sinus: No maxillary sinus tenderness or frontal sinus tenderness.     Left Sinus: No maxillary sinus tenderness or frontal sinus tenderness.     Mouth/Throat:     Pharynx: Postnasal drip present.     Tonsils: No tonsillar exudate.  Eyes:     Conjunctiva/sclera:     Right eye: Right conjunctiva is not injected. No chemosis or exudate.    Left eye: Left conjunctiva is not injected. No chemosis or exudate. Cardiovascular:     Rate and Rhythm: Normal rate and regular rhythm.     Pulses: Normal pulses.     Heart sounds: Normal heart sounds.  Pulmonary:     Effort: Pulmonary effort is normal.     Breath sounds: Normal breath sounds.  Neurological:     Mental Status: She is alert.   Psychiatric:        Mood and Affect: Mood normal.        Behavior: Behavior normal.    Assessment & Plan:   1. Nasal congestion (Primary) Osceola Plotkin is a pleasant 52 year old female patient who presents today for an acute visit with complaints of productive cough, crusted eyes this morning, and nasal congestion. Vital signs reviewed. Heart rate at higher end of normal range. No fever present today. Bilateral sclerae white with no injection. No discharge present on conjunctivae. TMs intact, no erythema, bulging, or fluid, with clearly visible landmarks. No tenderness with palpation of frontal and maxillary sinuses. Nasal congestion is prominent with swollen turbinates and edema. Differential diagnoses include (but are not limited to) acute bacterial rhinosinusitis, acute bronchitis, viral URI, pneumonia. Less concerned for pneumonia due to no crackles or rhonchi present during auscultation. Due to length of symptoms, feel this is bacterial in nature. Provided patient with antibiotic at this time. Advised to follow-up with PCP if symptoms persist or worsen.  - azithromycin  (ZITHROMAX ) 250 MG tablet; Take 2 tablets on day 1, then 1 tablet daily on days 2 through 5  Dispense: 6 tablet; Refill: 0  2. Acute cough Patient reports that her most bothersome  symptom at night is cough. She is in no acute distress and is well-appearing. Cardiovascular exam with heart regular rate and rhythm. Normal heart sounds, no murmurs present. Lungs clear to auscultation bilaterally- no adventitious lung sounds present. PDMP reviewed, no red flags present. Cough medication sent to pharmacy for patient to take at night. Advised patient to follow-up with PCP if cough does not resolve by next week. - HYDROcodone  bit-homatropine (HYCODAN) 5-1.5 MG/5ML syrup; Take 5 mLs by mouth every 8 (eight) hours as needed for cough.  Dispense: 120 mL; Refill: 0  Return if symptoms worsen or fail to improve.  Evalene Arts, FNP

## 2023-08-26 ENCOUNTER — Other Ambulatory Visit (HOSPITAL_BASED_OUTPATIENT_CLINIC_OR_DEPARTMENT_OTHER): Payer: Self-pay

## 2023-08-26 ENCOUNTER — Other Ambulatory Visit: Payer: Self-pay | Admitting: Medical

## 2023-08-26 DIAGNOSIS — G4700 Insomnia, unspecified: Secondary | ICD-10-CM

## 2023-08-26 DIAGNOSIS — F419 Anxiety disorder, unspecified: Secondary | ICD-10-CM

## 2023-08-26 DIAGNOSIS — I10 Essential (primary) hypertension: Secondary | ICD-10-CM

## 2023-08-27 ENCOUNTER — Other Ambulatory Visit (HOSPITAL_BASED_OUTPATIENT_CLINIC_OR_DEPARTMENT_OTHER): Payer: Self-pay

## 2023-08-27 ENCOUNTER — Other Ambulatory Visit: Payer: Self-pay

## 2023-08-27 MED ORDER — CITALOPRAM HYDROBROMIDE 20 MG PO TABS
30.0000 mg | ORAL_TABLET | Freq: Every day | ORAL | 1 refills | Status: DC
  Filled 2023-08-27 – 2023-09-24 (×2): qty 135, 90d supply, fill #0

## 2023-08-27 MED ORDER — LISINOPRIL 20 MG PO TABS
ORAL_TABLET | ORAL | 1 refills | Status: DC
  Filled 2023-08-27 – 2023-09-24 (×2): qty 135, 90d supply, fill #0

## 2023-08-29 ENCOUNTER — Ambulatory Visit
Admission: EM | Admit: 2023-08-29 | Discharge: 2023-08-29 | Disposition: A | Discharge status: Home / Self Care | Payer: Self-pay | Attending: Family Medicine | Admitting: Family Medicine

## 2023-08-29 ENCOUNTER — Ambulatory Visit (INDEPENDENT_AMBULATORY_CARE_PROVIDER_SITE_OTHER): Payer: Self-pay

## 2023-08-29 DIAGNOSIS — R0989 Other specified symptoms and signs involving the circulatory and respiratory systems: Secondary | ICD-10-CM

## 2023-08-29 DIAGNOSIS — J40 Bronchitis, not specified as acute or chronic: Secondary | ICD-10-CM

## 2023-08-29 DIAGNOSIS — R059 Cough, unspecified: Secondary | ICD-10-CM | POA: Diagnosis not present

## 2023-08-29 DIAGNOSIS — J329 Chronic sinusitis, unspecified: Secondary | ICD-10-CM

## 2023-08-29 MED ORDER — CEFDINIR 300 MG PO CAPS: 300.0000 mg | ORAL_CAPSULE | Freq: Two times a day (BID) | ORAL | 0 refills | Status: DC

## 2023-08-29 MED ORDER — PROMETHAZINE-DM 6.25-15 MG/5ML PO SYRP: 5.0000 mL | ORAL_SOLUTION | Freq: Three times a day (TID) | ORAL | 0 refills | Status: DC | PRN

## 2023-08-29 MED ORDER — PREDNISONE 20 MG PO TABS: ORAL_TABLET | ORAL | 0 refills | Status: DC

## 2023-08-29 NOTE — Discharge Instructions (Addendum)
Please start cefdinir and prednisone to address your infection sinobronchitis.  Use cough syrup as needed.  Will let you know about your x-ray results as the overread gets back to [place].

## 2023-08-29 NOTE — ED Provider Notes (Signed)
[facility] - URGENT CARE CENTER  Note:  This document was prepared using Conservation officer, historic buildings and [place] include unintentional dictation errors.  MRN: [id number] DOB: 10-23-1970  Subjective:   Jessica [name] is a 53 y.o. female presenting for 1 month history of persistent coughing, chest congestion, sinus symptoms, drainage, bilateral intermittent ear pain.  No history of asthma.  No smoking of any kind including cigarettes, cigars, vaping, marijuana use.  Patient underwent a course of azithromycin from an e-visit and received a ceftriaxone injection at a different clinic.  Reports very temporary relief from these measures.  No current facility-administered medications for this encounter.  Current Outpatient Medications:    ALPRAZolam (XANAX) 0.5 MG tablet, Take 1 tablet (0.5 mg total) by mouth daily as needed for anxiety., Disp: 15 tablet, Rfl: 5   atorvastatin (LIPITOR) 10 MG tablet, Take 1 tablet (10 mg total) by mouth daily., Disp: 90 tablet, Rfl: 0   Cetirizine HCl (ZYRTEC ALLERGY PO), Take by mouth daily. TAKE ONE PILL DAILY, Disp: , Rfl:    citalopram (CELEXA) 20 MG tablet, Take 1 and 1/2 tablets (30 mg total) by mouth daily., Disp: 135 tablet, Rfl: 1   Docusate Calcium (STOOL SOFTENER PO), Take by mouth., Disp: , Rfl:    fluticasone (FLONASE) 50 MCG/ACT nasal spray, Place 1 spray into both nostrils in the morning and at bedtime., Disp: 1 g, Rfl: 0   fluticasone (FLONASE) 50 MCG/ACT nasal spray, Place 1 spray into both nostrils in the morning and at bedtime., Disp: 16 g, Rfl: 0   HYDROcodone bit-homatropine (HYCODAN) 5-1.5 MG/5ML syrup, Take 5 mLs by mouth every 8 (eight) hours as needed for cough., Disp: 120 mL, Rfl: 0   lisinopril (ZESTRIL) 20 MG tablet, Take 1/2 tablet by mouth every morning and 1 tablet by mouth at bedtime, Disp: 135 tablet, Rfl: 1   metFORMIN (GLUCOPHAGE) 500 MG tablet, Take 1 tablet (500 mg total) by mouth 2 (two) times daily., Disp: 180 tablet, Rfl:  1   omeprazole (PRILOSEC) 20 MG capsule, Take 1 capsule (20 mg total) by mouth daily., Disp: 90 capsule, Rfl: 1   Vitamin D, Ergocalciferol, (DRISDOL) 1.25 MG (50000 UNIT) CAPS capsule, Take 1 capsule (50,000 Units total) by mouth every 7 (seven) days., Disp: 4 capsule, Rfl: 0   Allergies  Allergen Reactions   Hctz [Hydrochlorothiazide] Photosensitivity   Bactrim [Sulfamethoxazole-Trimethoprim]    Penicillins Itching    Can tolerate cephalosporins.   Vioxx [Rofecoxib] Itching    Past Medical History:  Diagnosis Date   Allergy    Anemia    AS A CHILD   Anxiety    Cataract    BORN WITH IT   Constipation    Cystitis, chronic    Depression    GERD (gastroesophageal reflux disease)    Gross hematuria    years ago, saw urology   Heart murmur    AS A CHILD   High cholesterol    Hypertension    Obesity    Pre-diabetes    Sleep apnea      Past Surgical History:  Procedure Laterality Date   ABDOMINAL HYSTERECTOMY     uterus and 1 ovary, still has 1 ovary   ADENOIDECTOMY     CARPAL TUNNEL RELEASE Bilateral    TONSILLECTOMY     ULNAR TUNNEL RELEASE Bilateral     Family History  Problem Relation Age of Onset   Hyperlipidemia Mother    Heart disease Father    Diabetes Father  Hypertension Father    Stroke Father 60   Heart failure Father    Sleep apnea Father    Cancer Maternal Grandmother        ovarian   Hypertension Paternal Grandmother    Diabetes Paternal Grandmother    Cancer Paternal Grandfather        skin   Colon cancer Neg Hx    Colon polyps Neg Hx    Crohn's disease Neg Hx    Esophageal cancer Neg Hx    Rectal cancer Neg Hx    Stomach cancer Neg Hx     Social History   Tobacco Use   Smoking status: Former    Current packs/day: 0.00    Types: Cigarettes    Quit date: 06/06/2016    Years since quitting: 7.2    Passive exposure: Past   Smokeless tobacco: Never  Vaping Use   Vaping status: Never Used  Substance Use Topics   Alcohol use: No    Drug use: No    ROS   Objective:   Vitals: BP (!) 149/97 (BP Location: Right Arm)   Pulse (!) 112   Temp 99.5 F (37.5 C) (Oral)   Resp 18   SpO2 96%   Physical Exam Constitutional:      General: She is not in acute distress.    Appearance: Normal appearance. She is well-developed and normal weight. She is not ill-appearing, toxic-appearing or diaphoretic.  HENT:     Head: Normocephalic and atraumatic.     Right Ear: Ear canal and external ear normal. No drainage or tenderness. A middle ear effusion is present. There is no impacted cerumen. Tympanic membrane is not erythematous or bulging.     Left Ear: Tympanic membrane, ear canal and external ear normal. No drainage or tenderness.  No middle ear effusion. There is no impacted cerumen. Tympanic membrane is not erythematous or bulging.     Nose: Nose normal. No congestion or rhinorrhea.     Mouth/Throat:     Mouth: Mucous membranes are moist. No oral lesions.     Pharynx: No pharyngeal swelling, oropharyngeal exudate, posterior oropharyngeal erythema or uvula swelling.     Tonsils: No tonsillar exudate or tonsillar abscesses.  Eyes:     General: No scleral icterus.       Right eye: No discharge.        Left eye: No discharge.     Extraocular Movements: Extraocular movements intact.     Right eye: Normal extraocular motion.     Left eye: Normal extraocular motion.     Conjunctiva/sclera: Conjunctivae normal.  Cardiovascular:     Rate and Rhythm: Normal rate and regular rhythm.     Heart sounds: Normal heart sounds. No murmur heard.    No friction rub. No gallop.  Pulmonary:     Effort: Pulmonary effort is normal. No respiratory distress.     Breath sounds: No stridor. Rhonchi (mid-lower lung fields bilaterally) present. No wheezing or rales.  Chest:     Chest wall: No tenderness.  Musculoskeletal:     Cervical back: Normal range of motion and neck supple.  Lymphadenopathy:     Cervical: No cervical adenopathy.   Skin:    General: Skin is warm and dry.  Neurological:     General: No focal deficit present.     Mental Status: She is alert and oriented to person, place, and time.  Psychiatric:        Mood and Affect: Mood normal.  Behavior: Behavior normal.     Assessment and Plan :   PDMP not reviewed this encounter.  1. Sinobronchitis   2. Chest congestion    X-ray over-read was pending at time of discharge, recommended follow up with only abnormal results. Otherwise will not call for negative over-read. Patient was in agreement.  Recommended covering for sinobronchitis with cefdinir, prednisone.  Use supportive care otherwise.  Counseled patient on potential for adverse effects with medications prescribed/recommended today, ER and return-to-clinic precautions discussed, patient verbalized understanding.    Wallis Bamberg, [place] 08/29/23 1426

## 2023-08-29 NOTE — ED Triage Notes (Signed)
Pt reports lingering cough since December 2025; ear pain, nasal congestion x 4 days. Mucinex gives some relief.

## 2023-08-30 ENCOUNTER — Telehealth: Payer: 59 | Admitting: Medical

## 2023-09-03 ENCOUNTER — Telehealth (INDEPENDENT_AMBULATORY_CARE_PROVIDER_SITE_OTHER): Payer: Commercial Managed Care - PPO | Admitting: Medical

## 2023-09-03 DIAGNOSIS — J329 Chronic sinusitis, unspecified: Secondary | ICD-10-CM

## 2023-09-03 DIAGNOSIS — R052 Subacute cough: Secondary | ICD-10-CM

## 2023-09-03 DIAGNOSIS — J40 Bronchitis, not specified as acute or chronic: Secondary | ICD-10-CM | POA: Diagnosis not present

## 2023-09-03 DIAGNOSIS — H6690 Otitis media, unspecified, unspecified ear: Secondary | ICD-10-CM | POA: Diagnosis not present

## 2023-09-03 MED ORDER — HYDROCODONE BIT-HOMATROP MBR 5-1.5 MG/5ML PO SOLN: 5.0000 mL | Freq: Three times a day (TID) | ORAL | 0 refills | Status: AC | PRN

## 2023-09-03 MED ORDER — BUDESONIDE-FORMOTEROL FUMARATE 80-4.5 MCG/ACT IN AERO: 2.0000 | INHALATION_SPRAY | Freq: Two times a day (BID) | RESPIRATORY_TRACT | 0 refills | Status: DC

## 2023-09-03 MED ORDER — ALBUTEROL SULFATE HFA 108 (90 BASE) MCG/ACT IN AERS: 2.0000 | INHALATION_SPRAY | Freq: Four times a day (QID) | RESPIRATORY_TRACT | 0 refills | Status: DC | PRN

## 2023-09-03 NOTE — Progress Notes (Signed)
 Subjective:     Patient ID: Jessica [name], female   DOB: [birth year], 53 y.o.   MRN: [id number]  This visit type was conducted due to national recommendations for restrictions regarding the COVID-19 Pandemic (e.g. social distancing) in an effort to limit this patient's exposure and mitigate transmission in our community.  Due to their co-morbid illnesses, this patient is at least at moderate risk for complications without adequate follow up.  This format is felt to be most appropriate for this patient at this time.    Documentation for virtual audio and video telecommunications through [facility] encounter:  The patient was located at home. The provider was located in the office. The patient did consent to this visit and is aware of possible charges through their insurance for this visit.  The other persons participating in this telemedicine service were none. Time spent on call was 20 minutes and in review of previous records 20 minutes total.  This virtual service is not related to other E/M service within previous 7 days.   HPI Chief Complaint  Patient presents with   Cough    Cough- needs medication for cough. Went to UC but still having cough with wheezing. Promethazine  DM that she was given is not helping. Finished prednisone  and still on antibiotic. Feels some better.    Virtual consult for illness.  She notes going to urgent last weekend. Diagnosiedd with sinobrohcintis and double ear infection.   Was prescribed prednisone , omnicef .  Has several more days of Omnicef  left.  Stil has some head congestion, stopped up, still feels some wheezing, ears still feel pressure.  Having post nasal drainage.   Still can't get rid of cough and promethazine  DM not helping.    Had chest xray that was normal.  Nonsmoker, no hx/o asthma.     Past Medical History:  Diagnosis Date   Allergy    Anemia    AS A CHILD   Anxiety    Cataract    BORN WITH IT   Constipation    Cystitis, chronic     Depression    GERD (gastroesophageal reflux disease)    Gross hematuria    years ago, saw urology   Heart murmur    AS A CHILD   High cholesterol    Hypertension    Obesity    Pre-diabetes    Sleep apnea    Current Outpatient Medications on File Prior to Visit  Medication Sig Dispense Refill   ALPRAZolam  (XANAX ) 0.5 MG tablet Take 1 tablet (0.5 mg total) by mouth daily as needed for anxiety. 15 tablet 5   atorvastatin  (LIPITOR) 10 MG tablet Take 1 tablet (10 mg total) by mouth daily. 90 tablet 0   cefdinir  (OMNICEF ) 300 MG capsule Take 1 capsule (300 mg total) by mouth 2 (two) times daily. 20 capsule 0   Cetirizine  HCl (ZYRTEC  ALLERGY PO) Take by mouth daily. TAKE ONE PILL DAILY     citalopram  (CELEXA ) 20 MG tablet Take 1 and 1/2 tablets (30 mg total) by mouth daily. 135 tablet 1   Docusate Calcium  (STOOL SOFTENER PO) Take by mouth.     fluticasone  (FLONASE ) 50 MCG/ACT nasal spray Place 1 spray into both nostrils in the morning and at bedtime. 16 g 0   lisinopril  (ZESTRIL ) 20 MG tablet Take 1/2 tablet by mouth every morning and 1 tablet by mouth at bedtime 135 tablet 1   metFORMIN  (GLUCOPHAGE ) 500 MG tablet Take 1 tablet (500 mg total) by mouth 2 (  two) times daily. 180 tablet 1   omeprazole  (PRILOSEC) 20 MG capsule Take 1 capsule (20 mg total) by mouth daily. 90 capsule 1   No current facility-administered medications on file prior to visit.     Review of Systems As in subjective    Objective:   Physical Exam Due to coronavirus pandemic stay at home measures, patient visit was virtual and they were not examined in person.   There were no vitals taken for this visit.  General: Well-developed well-nourished no acute distress Coughing quite a bit, no obvious wheezing sounds though.  Congested sounding.    Assessment:     Encounter Diagnoses  Name Primary?   Subacute cough Yes   Ear infection    Sinobronchitis        Plan:     We discussed her symptoms and concerns.   Stop Promethazine  DM since is not helping.  Continue over-the-counter Mucinex DM or similar to help with mucus and cough during the day.  Can use Hycodan for worse cough.  Caution with sedation.  Begin either albuterol  or Symbicort  which ever is covered by insurance today.  I believe this can help with the cough and wheezing.  Rinse mouth out with water after use.  Finish out the Omnicef  antibiotic.  Can use Tylenol  as needed for pain.  If not much improved within the next 5 to 7 days particularly with wheezing or cough or ear pain, then follow-up in person  Jessica [name] was seen today for cough.  Diagnoses and all orders for this visit:  Subacute cough  Ear infection  Sinobronchitis  Other orders -     HYDROcodone  bit-homatropine (HYCODAN) 5-1.5 MG/5ML syrup; Take 5 mLs by mouth every 8 (eight) hours as needed for up to 5 days for cough. -     albuterol  (VENTOLIN  HFA) 108 (90 Base) MCG/ACT inhaler; Inhale 2 puffs into the lungs every 6 (six) hours as needed for wheezing or shortness of breath. -     budesonide -formoterol  (SYMBICORT ) 80-4.5 MCG/ACT inhaler; Inhale 2 puffs into the lungs 2 (two) times daily.    Follow up as needed

## 2023-09-07 ENCOUNTER — Other Ambulatory Visit (HOSPITAL_BASED_OUTPATIENT_CLINIC_OR_DEPARTMENT_OTHER): Payer: Self-pay

## 2023-09-16 ENCOUNTER — Other Ambulatory Visit (HOSPITAL_BASED_OUTPATIENT_CLINIC_OR_DEPARTMENT_OTHER): Payer: Self-pay

## 2023-09-24 ENCOUNTER — Other Ambulatory Visit (HOSPITAL_BASED_OUTPATIENT_CLINIC_OR_DEPARTMENT_OTHER): Payer: Self-pay

## 2023-09-25 ENCOUNTER — Other Ambulatory Visit: Payer: Self-pay | Admitting: Medical

## 2023-09-27 ENCOUNTER — Encounter: Payer: Self-pay | Admitting: Internal Medicine

## 2023-10-13 ENCOUNTER — Other Ambulatory Visit (HOSPITAL_BASED_OUTPATIENT_CLINIC_OR_DEPARTMENT_OTHER): Payer: Self-pay

## 2023-12-09 NOTE — Telephone Encounter (Signed)
 Pt scheduled tomorrow

## 2023-12-10 ENCOUNTER — Ambulatory Visit: Admitting: Medical

## 2023-12-10 ENCOUNTER — Encounter: Payer: Self-pay | Admitting: Medical

## 2023-12-10 VITALS — BP 122/78 | HR 84 | Wt 209.8 lb

## 2023-12-10 DIAGNOSIS — R7303 Prediabetes: Secondary | ICD-10-CM | POA: Diagnosis not present

## 2023-12-10 DIAGNOSIS — Z136 Encounter for screening for cardiovascular disorders: Secondary | ICD-10-CM | POA: Diagnosis not present

## 2023-12-10 DIAGNOSIS — Z7185 Encounter for immunization safety counseling: Secondary | ICD-10-CM

## 2023-12-10 DIAGNOSIS — Z0000 Encounter for general adult medical examination without abnormal findings: Secondary | ICD-10-CM | POA: Diagnosis not present

## 2023-12-10 DIAGNOSIS — Z1231 Encounter for screening mammogram for malignant neoplasm of breast: Secondary | ICD-10-CM

## 2023-12-10 DIAGNOSIS — G4733 Obstructive sleep apnea (adult) (pediatric): Secondary | ICD-10-CM | POA: Diagnosis not present

## 2023-12-10 DIAGNOSIS — I10 Essential (primary) hypertension: Secondary | ICD-10-CM | POA: Diagnosis not present

## 2023-12-10 DIAGNOSIS — I700 Atherosclerosis of aorta: Secondary | ICD-10-CM | POA: Diagnosis not present

## 2023-12-10 DIAGNOSIS — E7800 Pure hypercholesterolemia, unspecified: Secondary | ICD-10-CM | POA: Diagnosis not present

## 2023-12-10 DIAGNOSIS — R5383 Other fatigue: Secondary | ICD-10-CM | POA: Diagnosis not present

## 2023-12-10 DIAGNOSIS — Z90710 Acquired absence of both cervix and uterus: Secondary | ICD-10-CM | POA: Insufficient documentation

## 2023-12-10 DIAGNOSIS — E559 Vitamin D deficiency, unspecified: Secondary | ICD-10-CM

## 2023-12-10 LAB — LIPID PANEL

## 2023-12-10 NOTE — Progress Notes (Signed)
 Subjective:   HPI  Jessica [name] is a 53 y.o. female who presents for Chief Complaint  Patient presents with   Annual Exam    Nonfasting cpe,     Patient Care Team: Daijha Leggio, Christiane Cowing, PA-C as PCP - General (Family Medicine) Mickie Alba (Dermatology) Dr. Legrand Puma, GI  Concerns: Here for follow-up visit physical  Feels sluggish and fatigued often.  Nonfasting, last 8 about 4 to 5 hours ago  Is compliant with CPAP some with time.  Status post hysterectomy.  Still has 1 ovary.  Had hysterectomy in the past due to heavy periods and pain.  Is prediabetic.  Compliant with metformin  500 mg twice daily.  Does not have a glucometer.  Hypertension-compliant with lisinopril  20 mg daily  Hyperlipidemia-compliant with Lipitor 10 mg daily  Compliant with citalopram  20 mg daily for mood  Asthma-does fine on current Symbicort  for maintenance and albuterol  as needed  Uses Xanax  some  Reviewed their medical, surgical, family, social, medication, and allergy history and updated chart as appropriate.  Allergies  Allergen Reactions   Hctz [Hydrochlorothiazide ] Photosensitivity   Bactrim  [Sulfamethoxazole -Trimethoprim ]    Penicillins Itching    Can tolerate cephalosporins.   Vioxx [Rofecoxib] Itching    Past Medical History:  Diagnosis Date   Allergy    Anemia    AS A CHILD   Anxiety    Cataract    BORN WITH IT   Constipation    Cystitis, chronic    Depression    GERD (gastroesophageal reflux disease)    Gross hematuria    years ago, saw urology   Heart murmur    AS A CHILD   High cholesterol    Hypertension    Obesity    Pre-diabetes    Sleep apnea     Current Outpatient Medications on File Prior to Visit  Medication Sig Dispense Refill   albuterol  (VENTOLIN  HFA) 108 (90 Base) MCG/ACT inhaler Inhale 2 puffs into the lungs every 6 (six) hours as needed for wheezing or shortness of breath. 8 g 0   ALPRAZolam  (XANAX ) 0.5 MG tablet Take 1 tablet (0.5 mg  total) by mouth daily as needed for anxiety. 15 tablet 5   atorvastatin  (LIPITOR) 10 MG tablet Take 1 tablet (10 mg total) by mouth daily. 90 tablet 0   budesonide -formoterol  (SYMBICORT ) 80-4.5 MCG/ACT inhaler Inhale 2 puffs into the lungs 2 (two) times daily. 1 each 0   Cetirizine  HCl (ZYRTEC  ALLERGY PO) Take by mouth daily. TAKE ONE PILL DAILY     citalopram  (CELEXA ) 20 MG tablet Take 1.5 tablets (30 mg total) by mouth daily. 135 tablet 1   Docusate Calcium  (STOOL SOFTENER PO) Take by mouth.     fluticasone  (FLONASE ) 50 MCG/ACT nasal spray Place 1 spray into both nostrils in the morning and at bedtime. 16 g 0   lisinopril  (ZESTRIL ) 20 MG tablet Take 0.5 tablets (10 mg total) by mouth in the morning AND 1 tablet (20 mg total) at bedtime. 135 tablet 1   metFORMIN  (GLUCOPHAGE ) 500 MG tablet Take 1 tablet (500 mg total) by mouth 2 (two) times daily. 180 tablet 1   omeprazole  (PRILOSEC) 20 MG capsule Take 1 capsule (20 mg total) by mouth daily. 90 capsule 1   No current facility-administered medications on file prior to visit.      Current Outpatient Medications:    albuterol  (VENTOLIN  HFA) 108 (90 Base) MCG/ACT inhaler, Inhale 2 puffs into the lungs every 6 (six) hours  as needed for wheezing or shortness of breath., Disp: 8 g, Rfl: 0   ALPRAZolam  (XANAX ) 0.5 MG tablet, Take 1 tablet (0.5 mg total) by mouth daily as needed for anxiety., Disp: 15 tablet, Rfl: 5   atorvastatin  (LIPITOR) 10 MG tablet, Take 1 tablet (10 mg total) by mouth daily., Disp: 90 tablet, Rfl: 0   budesonide -formoterol  (SYMBICORT ) 80-4.5 MCG/ACT inhaler, Inhale 2 puffs into the lungs 2 (two) times daily., Disp: 1 each, Rfl: 0   Cetirizine  HCl (ZYRTEC  ALLERGY PO), Take by mouth daily. TAKE ONE PILL DAILY, Disp: , Rfl:    citalopram  (CELEXA ) 20 MG tablet, Take 1.5 tablets (30 mg total) by mouth daily., Disp: 135 tablet, Rfl: 1   Docusate Calcium  (STOOL SOFTENER PO), Take by mouth., Disp: , Rfl:    fluticasone  (FLONASE ) 50  MCG/ACT nasal spray, Place 1 spray into both nostrils in the morning and at bedtime., Disp: 16 g, Rfl: 0   lisinopril  (ZESTRIL ) 20 MG tablet, Take 0.5 tablets (10 mg total) by mouth in the morning AND 1 tablet (20 mg total) at bedtime., Disp: 135 tablet, Rfl: 1   metFORMIN  (GLUCOPHAGE ) 500 MG tablet, Take 1 tablet (500 mg total) by mouth 2 (two) times daily., Disp: 180 tablet, Rfl: 1   omeprazole  (PRILOSEC) 20 MG capsule, Take 1 capsule (20 mg total) by mouth daily., Disp: 90 capsule, Rfl: 1  Family History  Problem Relation Age of Onset   Hyperlipidemia Mother    Heart disease Father        CHF, PVD   Diabetes Father    Hypertension Father    Stroke Father 54   Heart failure Father    Sleep apnea Father    Cancer Maternal Grandmother        ovarian   Hypertension Paternal Grandmother    Diabetes Paternal Grandmother    Cancer Paternal Grandfather        skin   Colon cancer Neg Hx    Colon polyps Neg Hx    Crohn's disease Neg Hx    Esophageal cancer Neg Hx    Rectal cancer Neg Hx    Stomach cancer Neg Hx     Past Surgical History:  Procedure Laterality Date   ABDOMINAL HYSTERECTOMY     uterus and 1 ovary, still has 1 ovary   ADENOIDECTOMY     CARPAL TUNNEL RELEASE Bilateral    TONSILLECTOMY     ULNAR TUNNEL RELEASE Bilateral       Review of Systems  Constitutional:  Positive for malaise/fatigue. Negative for chills, fever and weight loss.  HENT:  Negative for congestion, ear pain, hearing loss, sore throat and tinnitus.   Eyes:  Negative for blurred vision, pain and redness.  Respiratory:  Negative for cough, hemoptysis and shortness of breath.   Cardiovascular:  Negative for chest pain, palpitations, orthopnea, claudication and leg swelling.  Gastrointestinal:  Negative for abdominal pain, blood in stool, constipation, diarrhea, nausea and vomiting.  Genitourinary:  Negative for dysuria, flank pain, frequency, hematuria and urgency.  Musculoskeletal:  Negative for  falls, joint pain and myalgias.  Skin:  Negative for itching and rash.  Neurological:  Negative for dizziness, tingling, speech change, weakness and headaches.  Endo/Heme/Allergies:  Negative for polydipsia. Does not bruise/bleed easily.  Psychiatric/Behavioral:  Negative for depression and memory loss. The patient is not nervous/anxious and does not have insomnia.         Objective:  BP 122/78   Pulse 84   Wt 209  lb 12.8 oz (95.2 kg)   BMI 38.37 kg/m   General appearance: alert, no distress, WD/WN, Caucasian female Skin: Scattered macules, no worrisome lesions HEENT: normocephalic, conjunctiva/corneas normal, sclerae anicteric, PERRLA, EOMi, nares patent, no discharge or erythema, pharynx normal Oral cavity: MMM, tongue normal, teeth normal Neck: supple, no lymphadenopathy, no thyromegaly, no masses, normal ROM, no bruits Chest: non tender, normal shape and expansion Heart: RRR, normal S1, S2, no murmurs Lungs: CTA bilaterally, no wheezes, rhonchi, or rales Abdomen: +bs, soft, non tender, non distended, no masses, no hepatomegaly, no splenomegaly, no bruits Back: non tender, normal ROM, no scoliosis Musculoskeletal: upper extremities non tender, no obvious deformity, normal ROM throughout, lower extremities non tender, no obvious deformity, normal ROM throughout Extremities: no edema, no cyanosis, no clubbing Pulses: 2+ symmetric, upper and lower extremities, normal cap refill Neurological: alert, oriented x 3, CN2-12 intact, strength normal upper extremities and lower extremities, sensation normal throughout, DTRs 2+ throughout, no cerebellar signs, gait normal Psychiatric: normal affect, behavior normal, pleasant  breast/gyn/rectal - declined     Assessment and Plan :   Encounter Diagnoses  Name Primary?   Routine general medical examination at a health care facility Yes   Fatigue, unspecified type    Aortic atherosclerosis (HCC)    Essential hypertension    OSA  (obstructive sleep apnea)    Prediabetes    Pure hypercholesterolemia    Vitamin D  deficiency    Screening mammogram for breast cancer    Vaccine counseling    Screening for heart disease      This visit was a preventative care visit, also known as wellness visit or routine physical.   Topics typically include healthy lifestyle, diet, exercise, preventative care, vaccinations, sick and well care, proper use of emergency dept and after hours care, as well as other concerns.    Separate significant issues discussed: Hypertension - continue Lisinopril  20mg  but consider beta blocker given elevated heart rate around 100bpm at rest  Hyperlipidemia - continue Lipitor 10mg  daily, labs today  Asthma - continue Symbicort  for maintenance, albuterol  for rescue inhaler prn  Vit d deficiency - advised daily vitamin D  supplement  Anxiety - continue citalopram  20mg  daily, xanax  prn  Obesity, Bmi 38 - work on effort to lose weight through health diet, exercise, and possible medicaiton    General Recommendations: Continue to return yearly for your annual wellness and preventative care visits.  This gives us  a chance to discuss healthy lifestyle, exercise, vaccinations, review your chart record, and perform screenings where appropriate.  I recommend you see your eye doctor yearly for routine vision care.  I recommend you see your dentist yearly for routine dental care including hygiene visits twice yearly.   Vaccination recommendations were reviewed Immunization History  Administered Date(s) Administered   Hepb-cpg 09/03/2022, 10/02/2022   Influenza,inj,Quad PF,6+ Mos 05/22/2020   Influenza-Unspecified 02/24/2018, 04/20/2019, 05/01/2021, 05/12/2022   MMR 06/11/2017, 07/13/2017   PFIZER(Purple Top)SARS-COV-2 Vaccination 03/28/2020, 05/03/2020, 07/24/2020   Td 01/25/2004   Tdap 06/10/2017    Vaccine recommendations: Prevnar 20 and Shingrix   Vaccines administered today: None, she will  consider   Screening for cancer: Colon cancer screening: Prior or last colon cancer screen: 2023 reviewed, due repeat 5-10 years   Breast cancer screening: You should perform a self breast exam monthly.   We reviewed recommendations for regular mammograms and breast cancer screening.  Please call to schedule your mammogram.   [facility]  [phone],  [address] [place], [place] [address]   Cervical cancer screening: We reviewed recommendations for pap smear screening. Last pap - s/p hysterectomy   Skin cancer screening: Check your skin regularly for new changes, growing lesions, or other lesions of concern Come in for evaluation if you have skin lesions of concern.  Lung cancer screening: If you have a greater than 20 pack year history of tobacco use, then you [place] qualify for lung cancer screening with a chest CT scan.   Please call your insurance company to inquire about coverage for this test.  Pancreatic cancer: no current screening test is available routinely recommended.  (Risk factors: Smoking, overweight or obese, diabetes, chronic pancreatitis, work Nurse, mental health, Solicitor, 60 year old or greater, female greater than female, African-American, family history of pancreatic cancer, hereditary breast, ovarian, melanoma, Lynch, Peutz-jeghers).  We currently don't have screenings for other cancers besides breast, cervical, colon, and lung cancers.  If you have a strong family history of cancer or have other cancer screening concerns, please let me know.    Bone health: Get at least 150 minutes of aerobic exercise weekly Get weight bearing exercise at least once weekly Bone density test:  A bone density test is an imaging test that uses a type of X-ray to measure the amount of calcium  and other minerals in your bones. The test Colgan be used to diagnose or screen you for a condition that causes weak or thin bones  (osteoporosis), predict your risk for a broken bone (fracture), or determine how well your osteoporosis treatment is working. The bone density test is recommended for females 65 and older, or females or males <65 if certain risk factors such as thyroid disease, long term use of steroids such as for asthma or rheumatological issues, vitamin D  deficiency, estrogen deficiency, family history of osteoporosis, self or family history of fragility fracture in first degree relative.    Heart health: Get at least 150 minutes of aerobic exercise weekly Limit alcohol It is important to maintain a healthy blood pressure and healthy cholesterol numbers  Heart disease screening: Screening for heart disease includes screening for blood pressure, fasting lipids, glucose/diabetes screening, BMI height to weight ratio, reviewed of smoking status, physical activity, and diet.    Goals include blood pressure 120/80 or less, maintaining a healthy lipid/cholesterol profile, preventing diabetes or keeping diabetes numbers under good control, not smoking or using tobacco products, exercising most days per week or at least 150 minutes per week of exercise, and eating healthy variety of fruits and vegetables, healthy oils, and avoiding unhealthy food choices like fried food, fast food, high sugar and high cholesterol foods.    Other tests [place] possibly include EKG test, CT coronary calcium  score, echocardiogram, exercise treadmill stress test.    Referral today for CT coronary test.  Expect phone call about scheduling   Vascular disease screening: For high risk individuals including smokers, diabetes, patients with known heart disease or high blood pressure, kidney disease, and others, screening for vascular disease or atherosclerosis of the arteries is available.  Examples [place] include carotid ultrasound, abdominal aortic ultrasound, ABI blood flow screening in the legs, thoracic aorta screening.   Medical care  options: I recommend you continue to seek care here first for routine care.  We try really hard to have available appointments Monday through Friday daytime hours for sick visits, acute visits, and physicals.  Urgent care should be used for after hours and weekends for significant issues that cannot wait till the next day.  The emergency department should be used for significant potentially life-threatening emergencies.  The emergency department is expensive, can often have long wait times for less significant concerns, so try to utilize primary care, urgent care, or telemedicine when possible to avoid unnecessary trips to the emergency department.  Virtual visits and telemedicine have been introduced since the pandemic started in 2020, and can be convenient ways to receive medical care.  We offer virtual appointments as well to assist you in a variety of options to seek medical care.   Legal Take the time to do a Last Will and Testament, advanced directives including Healthcare Power of Attorney and Living Will documents.  Do not leave your family with burdens that can be handled ahead of time.   Advanced Directives: I recommend you consider completing a Health Care Power of Attorney and Living Will.   These documents respect your wishes and help alleviate burdens on your loved ones if you were to become terminally ill or be in a position to need those documents enforced.    You can complete Advanced Directives yourself, have them notarized, then have copies made for our office, for you and for anybody you feel should have them in safe keeping.  Or, you can have an attorney prepare these documents.   If you haven't updated your Last Will and Testament in a while, it Reynolds be worthwhile having an attorney prepare these documents together and save on some costs.       Spiritual and Emotional Health Keeping a healthy spiritual life can help you better manage your physical health. Your spiritual life  can help you to cope with any issues that Chenard arise with your physical health.  Balance can keep us  healthy and help us  to recover.  If you are struggling with your spiritual health there are questions that you Decuir want to ask yourself:  What makes me feel most complete? When do I feel most connected to the rest of the world? Where do I find the most inner strength? What am I doing when I feel whole?  Helpful tips: Being in nature. Some people feel very connected and at peace when they are walking outdoors or are outside. Helping others. Some feel the largest sense of wellbeing when they are of service to others. Being of service can take on many forms. It can be doing volunteer work, being kind to strangers, or offering a hand to a friend in need. Gratitude. Some people find they feel the most connected when they remain grateful. They [place] make lists of all the things they are grateful for or say a thank you out loud for all they have.    Emotional Health Are you in tune with your emotional health?  Check out this link: [email or web address]-[email or web address]/   Financial Health Make sure you use a budget for your personal finances Make sure you are insured against risks (health insurance, life insurance, auto insurance, etc) Save more, spend less Set financial goals If you need help in this area, good resources include counseling through [employer] or other community resources, have a meeting with a Social research officer, government, and a good resource is [employer] [place] podcast    Jessica [name] was seen today for annual exam.  Diagnoses and all orders for this visit:  Routine general medical examination at a health care facility -     Comprehensive metabolic panel with GFR -     CBC -     Lipid panel -  TSH -     Hemoglobin A1c -     Microalbumin/Creatinine Ratio, Urine -     Vitamin B12 -     MM 3D SCREENING MAMMOGRAM BILATERAL BREAST -     CT CARDIAC SCORING (SELF PAY ONLY);  Future  Fatigue, unspecified type -     TSH -     Vitamin B12  Aortic atherosclerosis (HCC) -     Lipid panel  Essential hypertension  OSA (obstructive sleep apnea)  Prediabetes -     Hemoglobin A1c -     Microalbumin/Creatinine Ratio, Urine  Pure hypercholesterolemia -     Lipid panel  Vitamin D  deficiency  Screening mammogram for breast cancer -     MM 3D SCREENING MAMMOGRAM BILATERAL BREAST  Vaccine counseling  Screening for heart disease -     CT CARDIAC SCORING (SELF PAY ONLY); Future    Follow-up pending labs, yearly for physical

## 2023-12-11 LAB — HEMOGLOBIN A1C
Est. average glucose Bld gHb Est-mCnc: 163 mg/dL
Hgb A1c MFr Bld: 7.3 % — ABNORMAL HIGH (ref 4.8–5.6)

## 2023-12-11 LAB — CBC
Hematocrit: 38.4 % (ref 34.0–46.6)
Hemoglobin: 12.6 g/dL (ref 11.1–15.9)
MCH: 29.1 pg (ref 26.6–33.0)
MCHC: 32.8 g/dL (ref 31.5–35.7)
MCV: 89 fL (ref 79–97)
Platelets: 322 10*3/uL (ref 150–450)
RBC: 4.33 x10E6/uL (ref 3.77–5.28)
RDW: 12.6 % (ref 11.7–15.4)
WBC: 12.3 10*3/uL — ABNORMAL HIGH (ref 3.4–10.8)

## 2023-12-11 LAB — TSH: TSH: 0.912 u[IU]/mL (ref 0.450–4.500)

## 2023-12-11 LAB — COMPREHENSIVE METABOLIC PANEL WITH GFR
ALT: 21 IU/L (ref 0–32)
AST: 16 IU/L (ref 0–40)
Albumin: 4.2 g/dL (ref 3.9–4.9)
Alkaline Phosphatase: 133 IU/L — ABNORMAL HIGH (ref 44–121)
BUN/Creatinine Ratio: 10 (ref 9–23)
BUN: 7 mg/dL (ref 6–24)
Bilirubin Total: 0.3 mg/dL (ref 0.0–1.2)
CO2: 21 mmol/L (ref 20–29)
Calcium: 9.1 mg/dL (ref 8.7–10.2)
Chloride: 100 mmol/L (ref 96–106)
Creatinine, Ser: 0.73 mg/dL (ref 0.57–1.00)
Globulin, Total: 2.7 g/dL (ref 1.5–4.5)
Glucose: 127 mg/dL — ABNORMAL HIGH (ref 70–99)
Potassium: 3.9 mmol/L (ref 3.5–5.2)
Sodium: 138 mmol/L (ref 134–144)
Total Protein: 6.9 g/dL (ref 6.0–8.5)
eGFR: 100 mL/min/{1.73_m2} (ref >59)

## 2023-12-11 LAB — MICROALBUMIN / CREATININE URINE RATIO
Creatinine, Urine: 179.9 mg/dL
Microalb/Creat Ratio: 8 mg/g{creat} (ref 0–29)
Microalbumin, Urine: 14.2 ug/mL

## 2023-12-11 LAB — LIPID PANEL
Chol/HDL Ratio: 3.7 ratio (ref 0.0–4.4)
Cholesterol, Total: 145 mg/dL (ref 100–199)
HDL: 39 mg/dL — ABNORMAL LOW (ref >39)
LDL Chol Calc (NIH): 82 mg/dL (ref 0–99)
Triglycerides: 133 mg/dL (ref 0–149)
VLDL Cholesterol Cal: 24 mg/dL (ref 5–40)

## 2023-12-11 LAB — VITAMIN B12: Vitamin B-12: 357 pg/mL (ref 232–1245)

## 2023-12-13 ENCOUNTER — Other Ambulatory Visit: Payer: Self-pay | Admitting: Medical

## 2023-12-13 ENCOUNTER — Ambulatory Visit: Payer: Self-pay | Admitting: Medical

## 2023-12-13 DIAGNOSIS — G4700 Insomnia, unspecified: Secondary | ICD-10-CM

## 2023-12-13 DIAGNOSIS — R7303 Prediabetes: Secondary | ICD-10-CM

## 2023-12-13 DIAGNOSIS — F419 Anxiety disorder, unspecified: Secondary | ICD-10-CM

## 2023-12-13 DIAGNOSIS — I10 Essential (primary) hypertension: Secondary | ICD-10-CM

## 2023-12-13 DIAGNOSIS — E7800 Pure hypercholesterolemia, unspecified: Secondary | ICD-10-CM

## 2023-12-13 MED ORDER — MOUNJARO 5 MG/0.5ML ~~LOC~~ SOAJ: 5.0000 mg | SUBCUTANEOUS | 1 refills | Status: DC

## 2023-12-13 MED ORDER — METFORMIN HCL 500 MG PO TABS: 500.0000 mg | ORAL_TABLET | Freq: Two times a day (BID) | ORAL | 1 refills | Status: DC

## 2023-12-13 MED ORDER — ATORVASTATIN CALCIUM 10 MG PO TABS
10.0000 mg | ORAL_TABLET | Freq: Every day | ORAL | 2 refills | Status: DC
Start: 2023-12-13 — End: 2024-04-26

## 2023-12-13 MED ORDER — LISINOPRIL 20 MG PO TABS: ORAL_TABLET | ORAL | 2 refills | Status: DC

## 2023-12-13 MED ORDER — ALBUTEROL SULFATE HFA 108 (90 BASE) MCG/ACT IN AERS
2.0000 | INHALATION_SPRAY | Freq: Four times a day (QID) | RESPIRATORY_TRACT | 1 refills | Status: AC | PRN
Start: 2023-12-13 — End: ?

## 2023-12-13 MED ORDER — BUDESONIDE-FORMOTEROL FUMARATE 80-4.5 MCG/ACT IN AERO: 2.0000 | INHALATION_SPRAY | Freq: Two times a day (BID) | RESPIRATORY_TRACT | 11 refills | Status: AC

## 2023-12-13 MED ORDER — MOUNJARO 2.5 MG/0.5ML ~~LOC~~ SOAJ: 2.5000 mg | SUBCUTANEOUS | 0 refills | Status: DC

## 2023-12-13 MED ORDER — CITALOPRAM HYDROBROMIDE 20 MG PO TABS: 30.0000 mg | ORAL_TABLET | Freq: Every day | ORAL | 2 refills | Status: DC

## 2023-12-13 NOTE — Progress Notes (Signed)
 Results sent through MyChart

## 2023-12-14 ENCOUNTER — Other Ambulatory Visit (HOSPITAL_COMMUNITY): Payer: Self-pay

## 2023-12-14 ENCOUNTER — Telehealth: Payer: Self-pay

## 2023-12-14 NOTE — Telephone Encounter (Signed)
 Pharmacy Patient Advocate Encounter   Received notification from CoverMyMeds that prior authorization for Mounjaro 2.5mg  is required/requested.   Insurance verification completed.   The patient is insured through [facility] .   Per test claim: PA required; PA submitted to above mentioned insurance via CoverMyMeds Key/confirmation #/EOC (Key: [id number]) Status is pending

## 2023-12-14 NOTE — Telephone Encounter (Signed)
 Pharmacy Patient Advocate Encounter  Received notification from [facility] that Prior Authorization for Mounjaro 2.5mg  has been APPROVED from 5.20.25 to 5.20.26. Ran test claim, Copay is $25.00. This test claim was processed through [facility]- copay amounts Holdren vary at other pharmacies due to pharmacy/plan contracts, or as the patient moves through the different stages of their insurance plan.   PA #/Case ID/Reference #:  (Key: [id number])

## 2024-02-01 ENCOUNTER — Other Ambulatory Visit: Payer: Self-pay | Admitting: Medical

## 2024-02-01 NOTE — Telephone Encounter (Signed)
 Pt was increased to 5mg  after she finished the 2.5mg 

## 2024-02-02 MED ORDER — MOUNJARO 5 MG/0.5ML ~~LOC~~ SOAJ: 5.0000 mg | SUBCUTANEOUS | 0 refills | Status: DC

## 2024-04-07 MED ORDER — MOUNJARO 5 MG/0.5ML ~~LOC~~ SOAJ: 5.0000 mg | SUBCUTANEOUS | 1 refills | Status: DC

## 2024-04-20 ENCOUNTER — Other Ambulatory Visit: Payer: Self-pay | Admitting: Medical

## 2024-04-20 NOTE — Telephone Encounter (Signed)
 Dose increased.

## 2024-04-26 ENCOUNTER — Ambulatory Visit: Payer: Self-pay | Admitting: Medical

## 2024-04-26 VITALS — BP 120/70 | HR 90 | Ht 61.0 in | Wt 195.6 lb

## 2024-04-26 DIAGNOSIS — E7800 Pure hypercholesterolemia, unspecified: Secondary | ICD-10-CM

## 2024-04-26 DIAGNOSIS — E118 Type 2 diabetes mellitus with unspecified complications: Secondary | ICD-10-CM | POA: Insufficient documentation

## 2024-04-26 DIAGNOSIS — F419 Anxiety disorder, unspecified: Secondary | ICD-10-CM

## 2024-04-26 DIAGNOSIS — I10 Essential (primary) hypertension: Secondary | ICD-10-CM

## 2024-04-26 DIAGNOSIS — Z6836 Body mass index (BMI) 36.0-36.9, adult: Secondary | ICD-10-CM | POA: Insufficient documentation

## 2024-04-26 DIAGNOSIS — R7303 Prediabetes: Secondary | ICD-10-CM

## 2024-04-26 DIAGNOSIS — K219 Gastro-esophageal reflux disease without esophagitis: Secondary | ICD-10-CM | POA: Insufficient documentation

## 2024-04-26 LAB — POCT GLYCOSYLATED HEMOGLOBIN (HGB A1C): Hemoglobin A1C: 5.7 % — AB (ref 4.0–5.6)

## 2024-04-26 MED ORDER — CITALOPRAM HYDROBROMIDE 20 MG PO TABS: 20.0000 mg | ORAL_TABLET | Freq: Every day | ORAL | 1 refills | Status: DC

## 2024-04-26 MED ORDER — ATORVASTATIN CALCIUM 10 MG PO TABS
10.0000 mg | ORAL_TABLET | Freq: Every day | ORAL | 1 refills | Status: AC
Start: 2024-04-26 — End: ?

## 2024-04-26 MED ORDER — METFORMIN HCL 500 MG PO TABS: 500.0000 mg | ORAL_TABLET | Freq: Two times a day (BID) | ORAL | 1 refills | Status: DC

## 2024-04-26 MED ORDER — TIRZEPATIDE 7.5 MG/0.5ML ~~LOC~~ SOAJ: 7.5000 mg | SUBCUTANEOUS | 1 refills | Status: DC

## 2024-04-26 MED ORDER — METFORMIN HCL 500 MG PO TABS: 500.0000 mg | ORAL_TABLET | Freq: Two times a day (BID) | ORAL | 1 refills | Status: AC

## 2024-04-26 MED ORDER — LISINOPRIL 20 MG PO TABS: ORAL_TABLET | ORAL | 2 refills | Status: AC

## 2024-04-26 MED ORDER — ATORVASTATIN CALCIUM 10 MG PO TABS: 10.0000 mg | ORAL_TABLET | Freq: Every day | ORAL | 1 refills | Status: DC

## 2024-04-26 MED ORDER — CITALOPRAM HYDROBROMIDE 20 MG PO TABS: 20.0000 mg | ORAL_TABLET | Freq: Every day | ORAL | 1 refills | Status: AC

## 2024-04-26 MED ORDER — LISINOPRIL 20 MG PO TABS: ORAL_TABLET | ORAL | 2 refills | Status: DC

## 2024-04-26 NOTE — Progress Notes (Signed)
 Name: Jessica [name]   Date of Visit: 04/26/24   Date of last visit with me: 04/20/2024   CHIEF COMPLAINT:  Chief Complaint  Patient presents with   Medical Management of Chronic Issues    Mounjaro -        HPI:  Discussed the use of AI scribe software for clinical note transcription with the patient, who gave verbal consent to proceed.  History of Present Illness   Jessica [name] is a 53 year old female with type 2 diabetes who presents for follow-up on Mounjaro  treatment.  She is currently on Mounjaro  and metformin  for type 2 diabetes management and finds it more tolerable than previous medications like Ozempic  and Wegovy , which caused nausea. She has been on a mounjaro  5 mg dose for a month without experiencing nausea or constipation. Her last A1c was 7.3% in [place], and she is not currently checking her blood sugars at home. Her other medications include atorvastatin  10 mg, lisinopril  20 mg, and metformin  500 mg twice a day. Her cholesterol, thyroid , B12, and microalbumin levels were checked in [place].  She engages in physical activity by walking about five days a week but is not currently doing any weight-bearing exercises. Her dietary habits have improved, with no consumption of soda, sweet tea, or juices, and she is drinking more water. Her meals include a protein shake in the morning, a low-calorie lunch, and a larger dinner with protein and vegetables. She tries to avoid snacking, but when she does, she opts for healthy options like nuts.  She recently experienced a change in her insurance status as she stepped away from work due to father being in Hospice. She had been using a coupon to afford Mounjaro  at a reduced cost. She is exploring options for new insurance coverage and has considered COBRA, though it is expensive. She has also checked into some outside plans and is awaiting information on those.  No other aggravating or relieving factors. No other complaint.  Past Medical  History:  Diagnosis Date   Allergy    Anemia    AS A CHILD   Anxiety    Cataract    BORN WITH IT   Constipation    Cystitis, chronic    Depression    GERD (gastroesophageal reflux disease)    Gross hematuria    years ago, saw urology   Heart murmur    AS A CHILD   High cholesterol    Hypertension    Obesity    Pre-diabetes    Sleep apnea    Current Outpatient Medications on File Prior to Visit  Medication Sig Dispense Refill   ALPRAZolam  (XANAX ) 0.5 MG tablet Take 1 tablet (0.5 mg total) by mouth daily as needed for anxiety. 15 tablet 5   budesonide -formoterol  (SYMBICORT ) 80-4.5 MCG/ACT inhaler Inhale 2 puffs into the lungs 2 (two) times daily. 1 each 11   Cetirizine  HCl (ZYRTEC  ALLERGY PO) Take by mouth daily. TAKE ONE PILL DAILY     Docusate Calcium  (STOOL SOFTENER PO) Take by mouth.     fluticasone  (FLONASE ) 50 MCG/ACT nasal spray Place 1 spray into both nostrils in the morning and at bedtime. 16 g 0   omeprazole  (PRILOSEC) 20 MG capsule Take 1 capsule (20 mg total) by mouth daily. 90 capsule 1   albuterol  (VENTOLIN  HFA) 108 (90 Base) MCG/ACT inhaler Inhale 2 puffs into the lungs every 6 (six) hours as needed for wheezing or shortness of breath. 8 g 1  No current facility-administered medications on file prior to visit.   ROS as in subjective   Objective: BP 120/70   Pulse 90   Ht 5' 1 (1.549 m)   Wt 195 lb 9.6 oz (88.7 kg)   BMI 36.96 kg/m   Gen: wd, wn ,nad Heart rrr, normal s1, s2, no murmurs Lungs clear No ext edema   Diabetic Foot Exam - Simple   Simple Foot Form Diabetic Foot exam was performed with the following findings: Yes 04/26/2024  8:54 AM  Visual Inspection No deformities, no ulcerations, no other skin breakdown bilaterally: Yes Sensation Testing Intact to touch and monofilament testing bilaterally: Yes Pulse Check Posterior Tibialis and Dorsalis pulse intact bilaterally: Yes Comments       Assessment and Plan Encounter Diagnoses   Name Primary?   Type II diabetes mellitus with complication (HCC) Yes   Essential hypertension    Pure hypercholesterolemia    BMI 36.0-36.9,adult    Prediabetes    Anxiety    Gastroesophageal reflux disease, unspecified whether esophagitis present    Expressed my empathy for her situation with father in hospice.  Type 2 diabetes mellitus Managed with Mounjaro , well-tolerated. Previous A1c 7.3, not at goal. Insurance coverage uncertain. Discussed alternatives if Mounjaro  unaffordable. -Increase from Mounjaro  5 mg to Mounjaro  7.5 mg weekly - Continue metformin  500 mg twice daily -Check A1c today with a finger stick. -Sample glucometer given today - Consider adding glipizide if Mounjaro  is unaffordable and blood glucose levels are not controlled with metformin  alone.  Obesity Management includes lifestyle modifications, Mounjaro  medication.  -Encouraged weight-bearing exercises for weight loss and bone health. - Incorporate weight-bearing exercises at least two days a week. - Continue current dietary habits with focus on reducing calorie intake and avoiding sugary drinks. - Consider phentermine  every other day short-term if Mounjaro  not affordable any longer  Hypertension Managed with lisinopril  20 mg. Blood pressure well-controlled.  Hyperlipidemia Managed with atorvastatin  10 mg. Previous LDL at goal. - Refill atorvastatin  10 mg.  Insurance and Medication Coverage Recent loss of insurance affects medication affordability, especially Mounjaro . Discussed COBRA or alternative plans. - Follow up on Mounjaro  prescription status and insurance coverage. - Consider alternative diabetes management strategies if Mounjaro  is unaffordable.  General Health Maintenance Due for vaccinations. Discussed obtaining vaccines at a pharmacy for affordability. - Recommend obtaining flu, shingles, and pneumonia vaccines at a pharmacy where she [place] be more affordable or free.  GERD - continue  omeprazole  if needed, but consider Pepcid  for less long term risks   [name] was seen today for medical management of chronic issues.  Diagnoses and all orders for this visit:  Type II diabetes mellitus with complication (HCC) -     HgB A1c  Essential hypertension -     lisinopril  (ZESTRIL ) 20 MG tablet; Take 0.5 tablets (10 mg total) by mouth in the morning AND 1 tablet (20 mg total) at bedtime.  Pure hypercholesterolemia -     atorvastatin  (LIPITOR) 10 MG tablet; Take 1 tablet (10 mg total) by mouth daily.  BMI 36.0-36.9,adult  Prediabetes -     metFORMIN  (GLUCOPHAGE ) 500 MG tablet; Take 1 tablet (500 mg total) by mouth 2 (two) times daily.  Anxiety -     citalopram  (CELEXA ) 20 MG tablet; Take 1 tablet (20 mg total) by mouth daily.  Gastroesophageal reflux disease, unspecified whether esophagitis present  Other orders -     tirzepatide  (MOUNJARO ) 7.5 MG/0.5ML Pen; Inject 7.5 mg into the skin once a week.  F/u 4 months

## 2024-04-27 ENCOUNTER — Other Ambulatory Visit: Payer: Self-pay | Admitting: Medical

## 2024-04-27 MED ORDER — PHENTERMINE HCL 37.5 MG PO TABS: 37.5000 mg | ORAL_TABLET | Freq: Every day | ORAL | 1 refills | Status: AC

## 2024-07-07 ENCOUNTER — Other Ambulatory Visit: Payer: Self-pay | Admitting: Medical

## 2024-07-07 ENCOUNTER — Other Ambulatory Visit (HOSPITAL_COMMUNITY): Payer: Self-pay

## 2024-07-07 ENCOUNTER — Telehealth: Payer: Self-pay

## 2024-07-07 MED ORDER — MOUNJARO 2.5 MG/0.5ML ~~LOC~~ SOAJ: 2.5000 mg | SUBCUTANEOUS | 0 refills | Status: AC

## 2024-07-07 NOTE — Telephone Encounter (Signed)
 Pharmacy Patient Advocate Encounter   Received notification from Patient Advice Request messages that prior authorization for tirzepatide  (MOUNJARO ) 2.5 MG/0.5ML Pen  is required/requested.   Insurance verification completed.   The patient is insured through [facility].   Per test claim: PA required; PA submitted to above mentioned insurance via Latent Key/confirmation #/EOC [facility] Status is pending

## 2024-07-07 NOTE — Telephone Encounter (Signed)
 Pharmacy Patient Advocate Encounter   Received notification from [employer] that Prior Authorization for Mounjaro  2.5MG /0.5ML auto-injectors has been DENIED.  Full denial letter will be uploaded to the media tab. See denial reason below.        Pharmacy Patient Advocate Encounter   Received notification from Patient Advice Request messages that prior authorization for tirzepatide  (MOUNJARO ) 2.5 MG/0.5ML Pen  is required/requested.   Insurance verification completed.   The patient is insured through [facility].   Per test claim: PA required; PA submitted to above mentioned insurance via Latent Key/confirmation #/EOC [facility] Status is pending

## 2024-07-07 NOTE — Telephone Encounter (Signed)
 Needs PA

## 2024-07-10 ENCOUNTER — Other Ambulatory Visit: Payer: Self-pay | Admitting: Medical

## 2024-07-10 MED ORDER — TRULICITY 0.75 MG/0.5ML ~~LOC~~ SOAJ: 0.7500 mg | SUBCUTANEOUS | 0 refills | Status: AC

## 2024-07-13 ENCOUNTER — Other Ambulatory Visit (HOSPITAL_COMMUNITY): Payer: Self-pay

## 2024-07-13 ENCOUNTER — Telehealth: Payer: Self-pay | Admitting: Pharmacy Technician

## 2024-07-13 NOTE — Telephone Encounter (Signed)
 Pharmacy Patient Advocate Encounter  Received notification from [facility] MEDICAID that Prior Authorization for Trulicity  0.75MG /0.5ML auto-injectors has been APPROVED from 07/13/2024 to 07/13/2025. Ran test claim, Copay is $4.00. This test claim was processed through [facility]- copay amounts Butzin vary at other pharmacies due to pharmacy/plan contracts, or as the patient moves through the different stages of their insurance plan.   PA #/Case ID/Reference #: [id number]

## 2024-07-13 NOTE — Telephone Encounter (Signed)
 Pharmacy Patient Advocate Encounter   Received notification from Onbase that prior authorization for Trulicity  0.75MG /0.5ML auto-injectors is required/requested.   Insurance verification completed.   The patient is insured through [facility] MEDICAID.   Per test claim: PA required; PA submitted to above mentioned insurance via Latent Key/confirmation #/EOC [id number] Status is pending

## 2024-07-31 ENCOUNTER — Ambulatory Visit: Payer: Self-pay | Admitting: Medical

## 2024-08-03 ENCOUNTER — Ambulatory Visit: Admitting: Medical

## 2024-12-20 ENCOUNTER — Encounter: Payer: Self-pay | Admitting: Medical
